# Patient Record
Sex: Male | Born: 1980 | Race: White | Hispanic: No | Marital: Married | Smoking: Former smoker
Health system: Southern US, Community
[De-identification: ages and names within clinical notes are randomized; demographics above are authoritative.]

## PROBLEM LIST (undated history)

## (undated) DIAGNOSIS — F209 Schizophrenia, unspecified: Secondary | ICD-10-CM

## (undated) DIAGNOSIS — I82409 Acute embolism and thrombosis of unspecified deep veins of unspecified lower extremity: Secondary | ICD-10-CM

## (undated) DIAGNOSIS — E119 Type 2 diabetes mellitus without complications: Secondary | ICD-10-CM

## (undated) DIAGNOSIS — N200 Calculus of kidney: Secondary | ICD-10-CM

## (undated) DIAGNOSIS — F319 Bipolar disorder, unspecified: Secondary | ICD-10-CM

## (undated) DIAGNOSIS — R569 Unspecified convulsions: Secondary | ICD-10-CM

## (undated) HISTORY — PX: OTHER SURGICAL HISTORY: SHX169

## (undated) HISTORY — DX: Bipolar disorder, unspecified: F31.9

## (undated) HISTORY — DX: Schizophrenia, unspecified: F20.9

---
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
## 2003-12-07 ENCOUNTER — Other Ambulatory Visit: Payer: Self-pay

## 2004-08-04 ENCOUNTER — Emergency Department: Payer: Self-pay | Admitting: Emergency Medicine

## 2004-08-16 ENCOUNTER — Emergency Department: Payer: Self-pay | Admitting: Unknown Physician Specialty

## 2005-12-07 ENCOUNTER — Emergency Department: Payer: Self-pay | Admitting: Emergency Medicine

## 2005-12-14 ENCOUNTER — Emergency Department: Payer: Self-pay | Admitting: Emergency Medicine

## 2006-10-19 ENCOUNTER — Emergency Department: Payer: Self-pay | Admitting: General Practice

## 2006-11-30 ENCOUNTER — Emergency Department: Payer: Self-pay | Admitting: Emergency Medicine

## 2008-03-11 ENCOUNTER — Emergency Department: Payer: Self-pay | Admitting: Emergency Medicine

## 2008-03-11 ENCOUNTER — Other Ambulatory Visit: Payer: Self-pay

## 2008-08-22 ENCOUNTER — Emergency Department: Payer: Self-pay | Admitting: Emergency Medicine

## 2008-09-07 IMAGING — CR DG CHEST 2V
1 series · 2 of 2 positions shown · non-contrast
Comparison: none

REASON FOR EXAM: cough, fever
COMMENTS:

PROCEDURE:     DXR - DXR CHEST PA (OR AP) AND LATERAL  - November 30, 2006  [DATE]
RESULT:     The lungs are clear. The heart and pulmonary vessels are normal.
The bony and mediastinal structures are unremarkable. There is no effusion.
There is no pneumothorax or evidence of congestive failure.

[Series 1: view not recorded · 0.17mm/px · 2 of 2 slices shown]
[im 1/2]
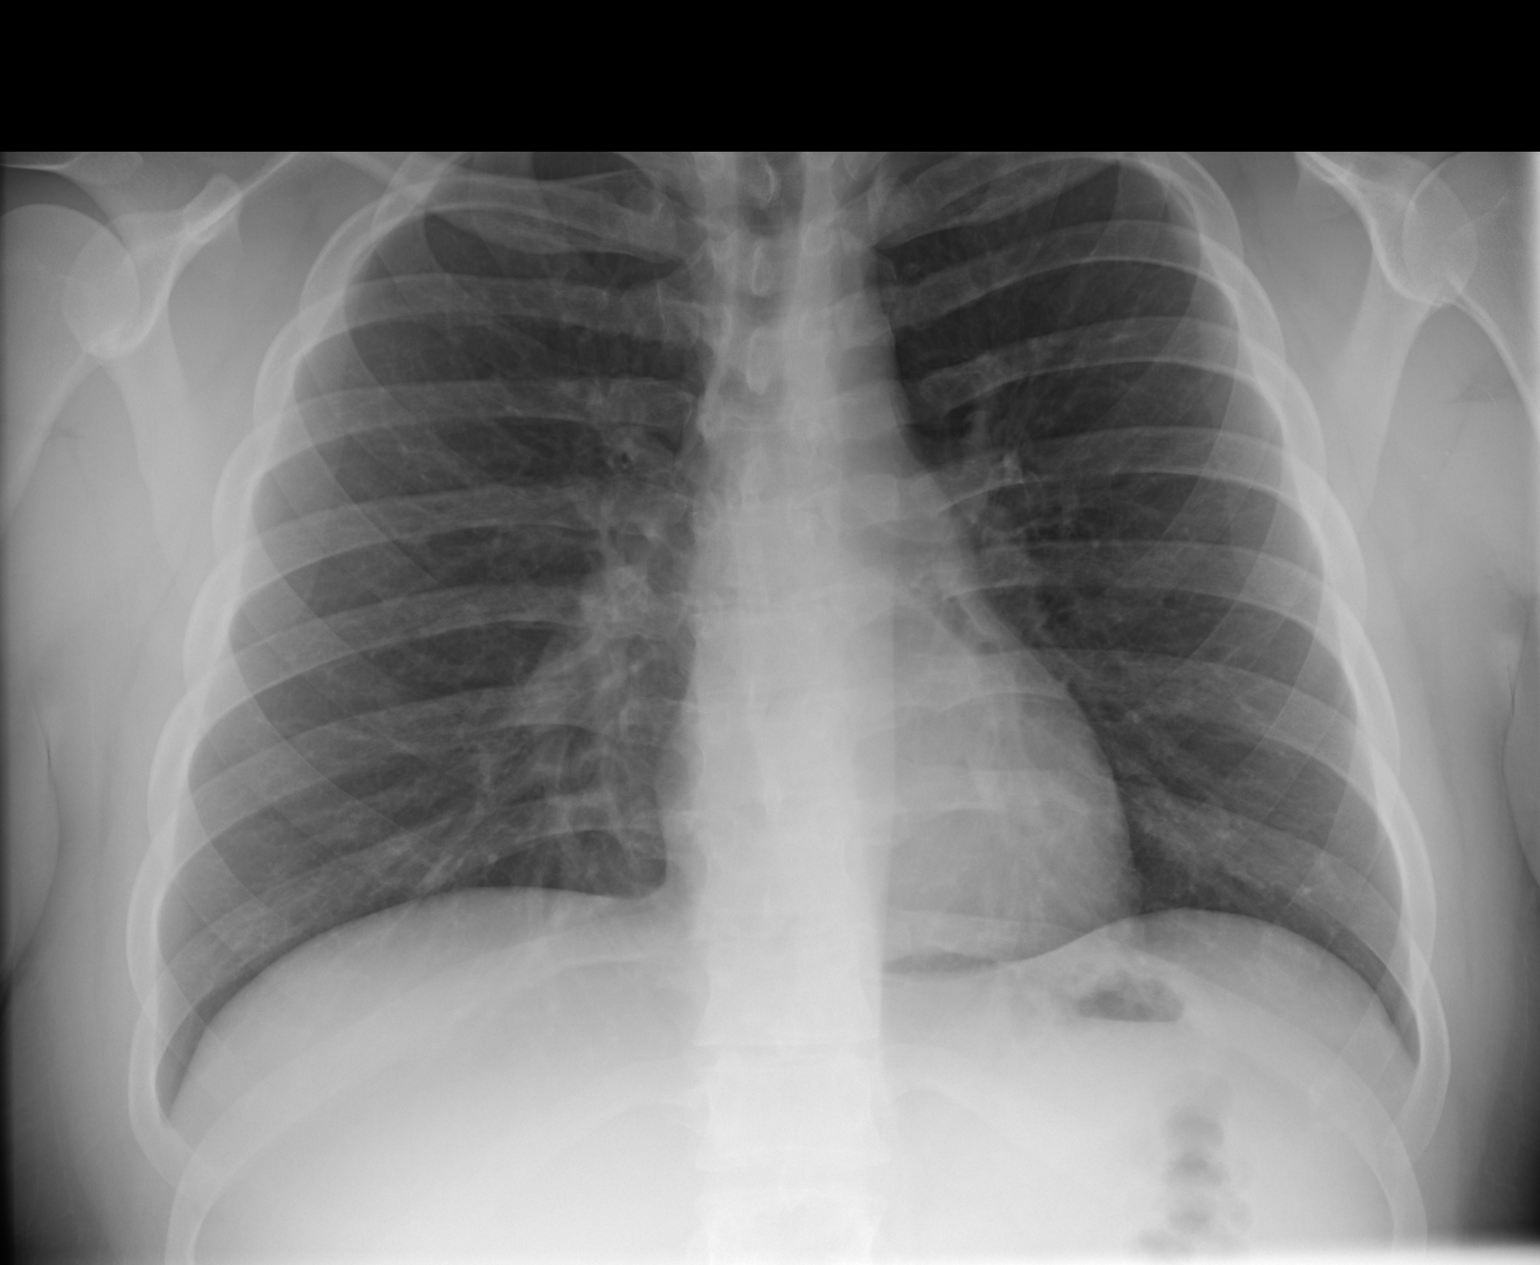
[im 2/2]
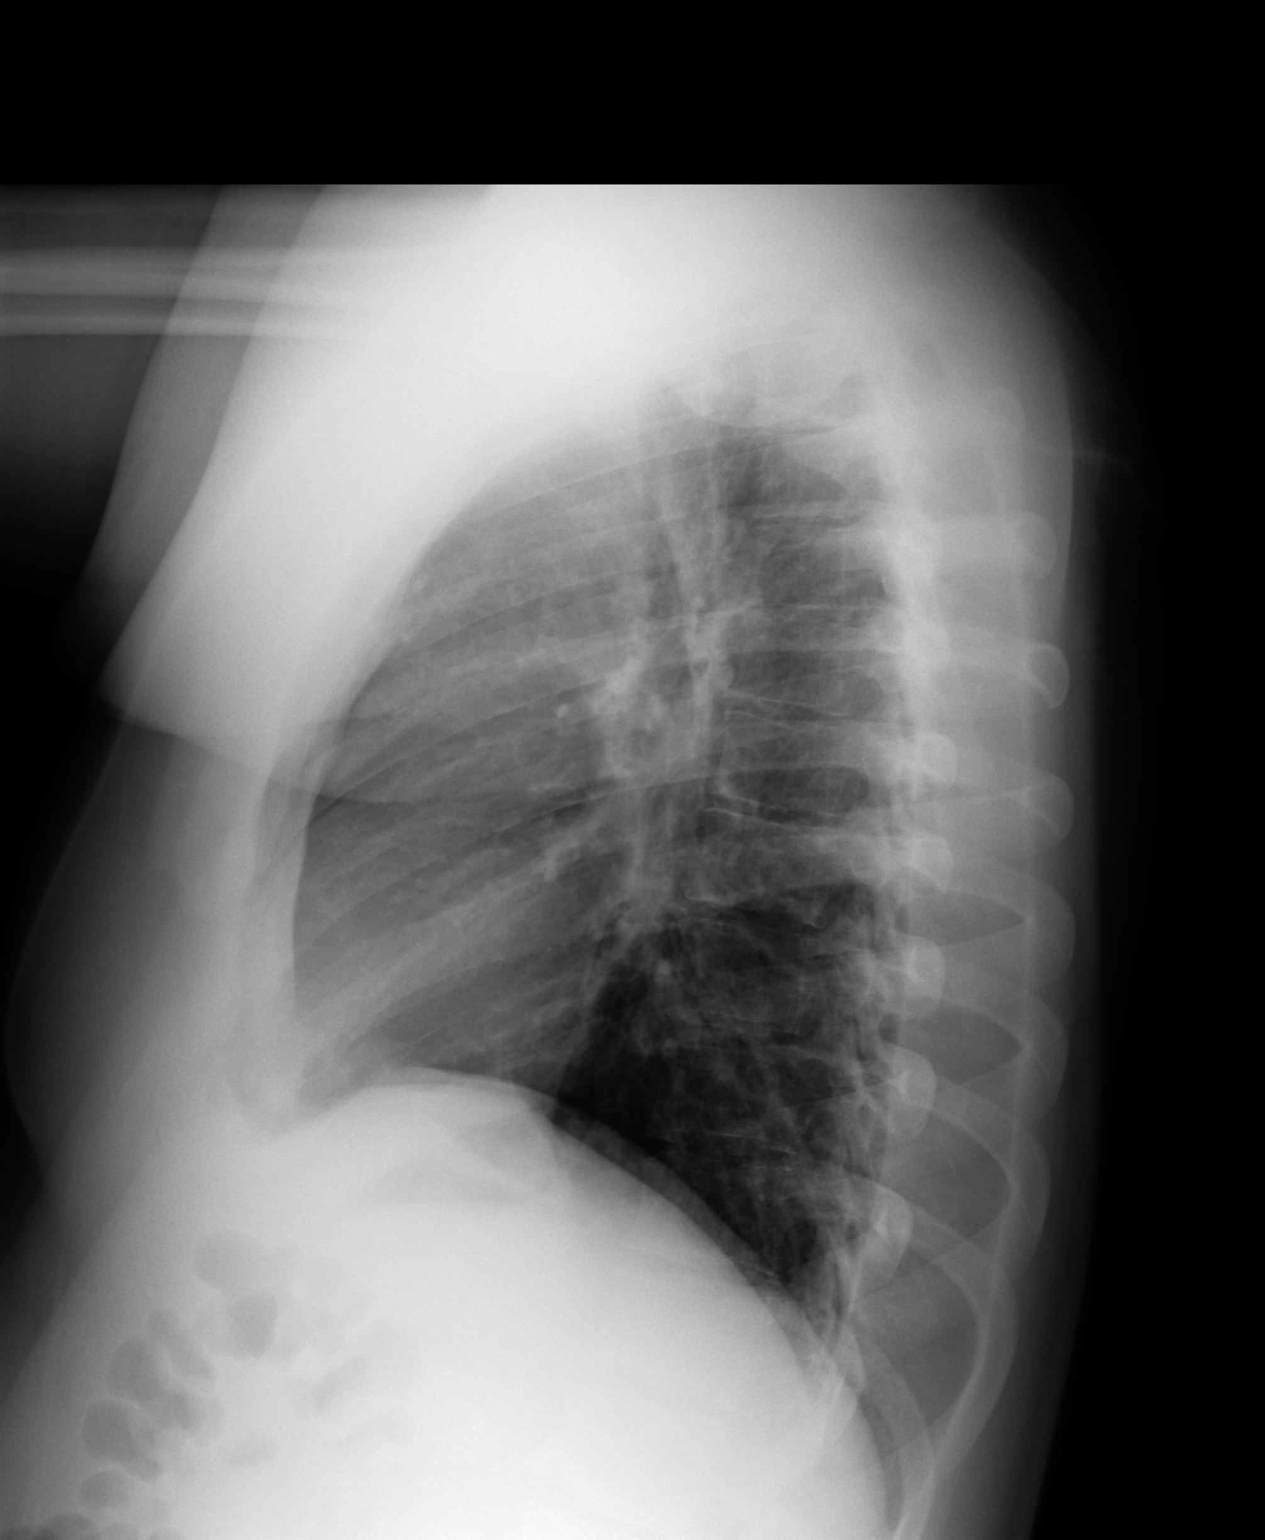

[2 of 2 positions shown; findings below may reference images not displayed]

IMPRESSION: No acute cardiopulmonary disease.

## 2008-11-20 ENCOUNTER — Emergency Department: Payer: Self-pay | Admitting: Emergency Medicine

## 2008-11-22 ENCOUNTER — Emergency Department: Payer: Self-pay | Admitting: Emergency Medicine

## 2009-01-29 ENCOUNTER — Emergency Department: Payer: Self-pay | Admitting: Emergency Medicine

## 2009-06-30 ENCOUNTER — Emergency Department: Payer: Self-pay | Admitting: Emergency Medicine

## 2009-10-18 ENCOUNTER — Emergency Department: Payer: Self-pay | Admitting: Emergency Medicine

## 2009-10-31 ENCOUNTER — Emergency Department: Payer: Self-pay | Admitting: Internal Medicine

## 2009-12-18 IMAGING — CR DG CHEST 2V
1 series · 2 of 2 positions shown · non-contrast
Comparison: none

REASON FOR EXAM: Chest Pain
COMMENTS:

PROCEDURE:     DXR - DXR CHEST PA (OR AP) AND LATERAL  - March 11, 2008  [DATE]
RESULT:     Comparison is made to a prior exam of 11/30/2006. The lung fields
are clear. The heart, mediastinal and osseous structures show no acute
changes.

[Series 1: view not recorded · 0.17mm/px · 2 of 2 slices shown]
[im 1/2]
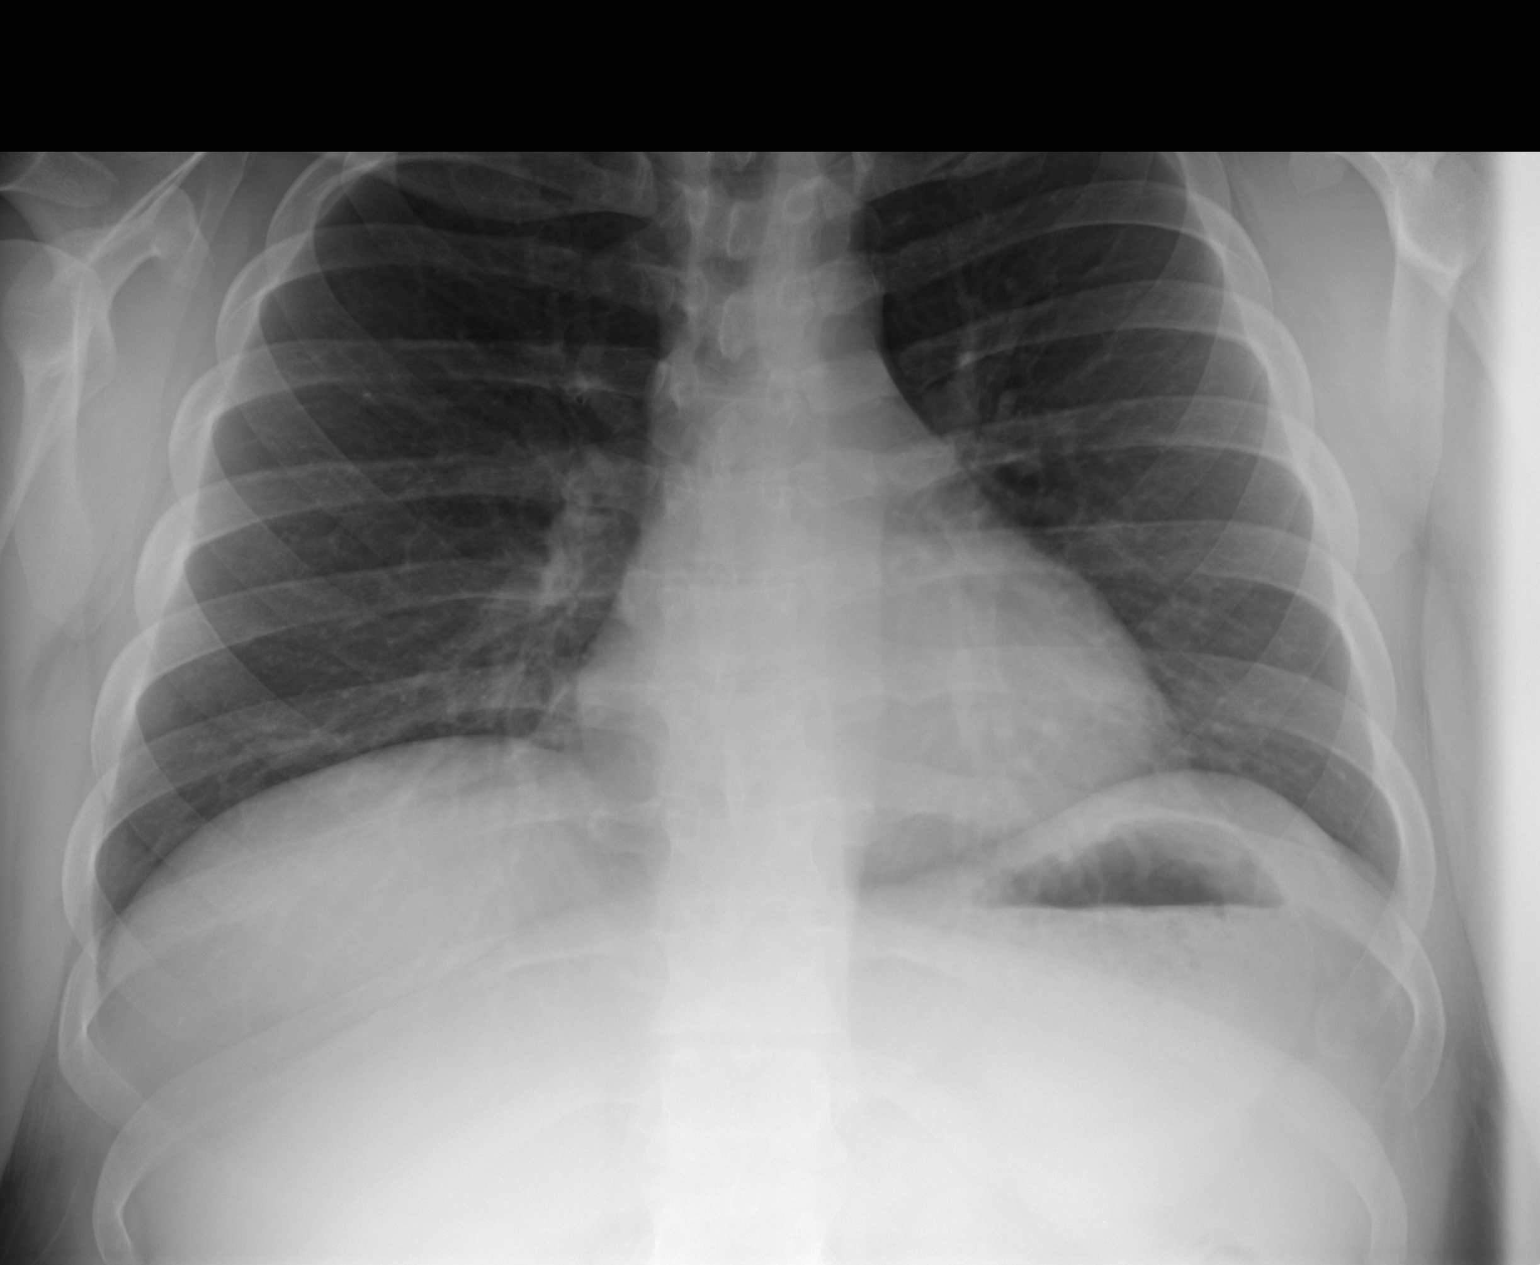
[im 2/2]
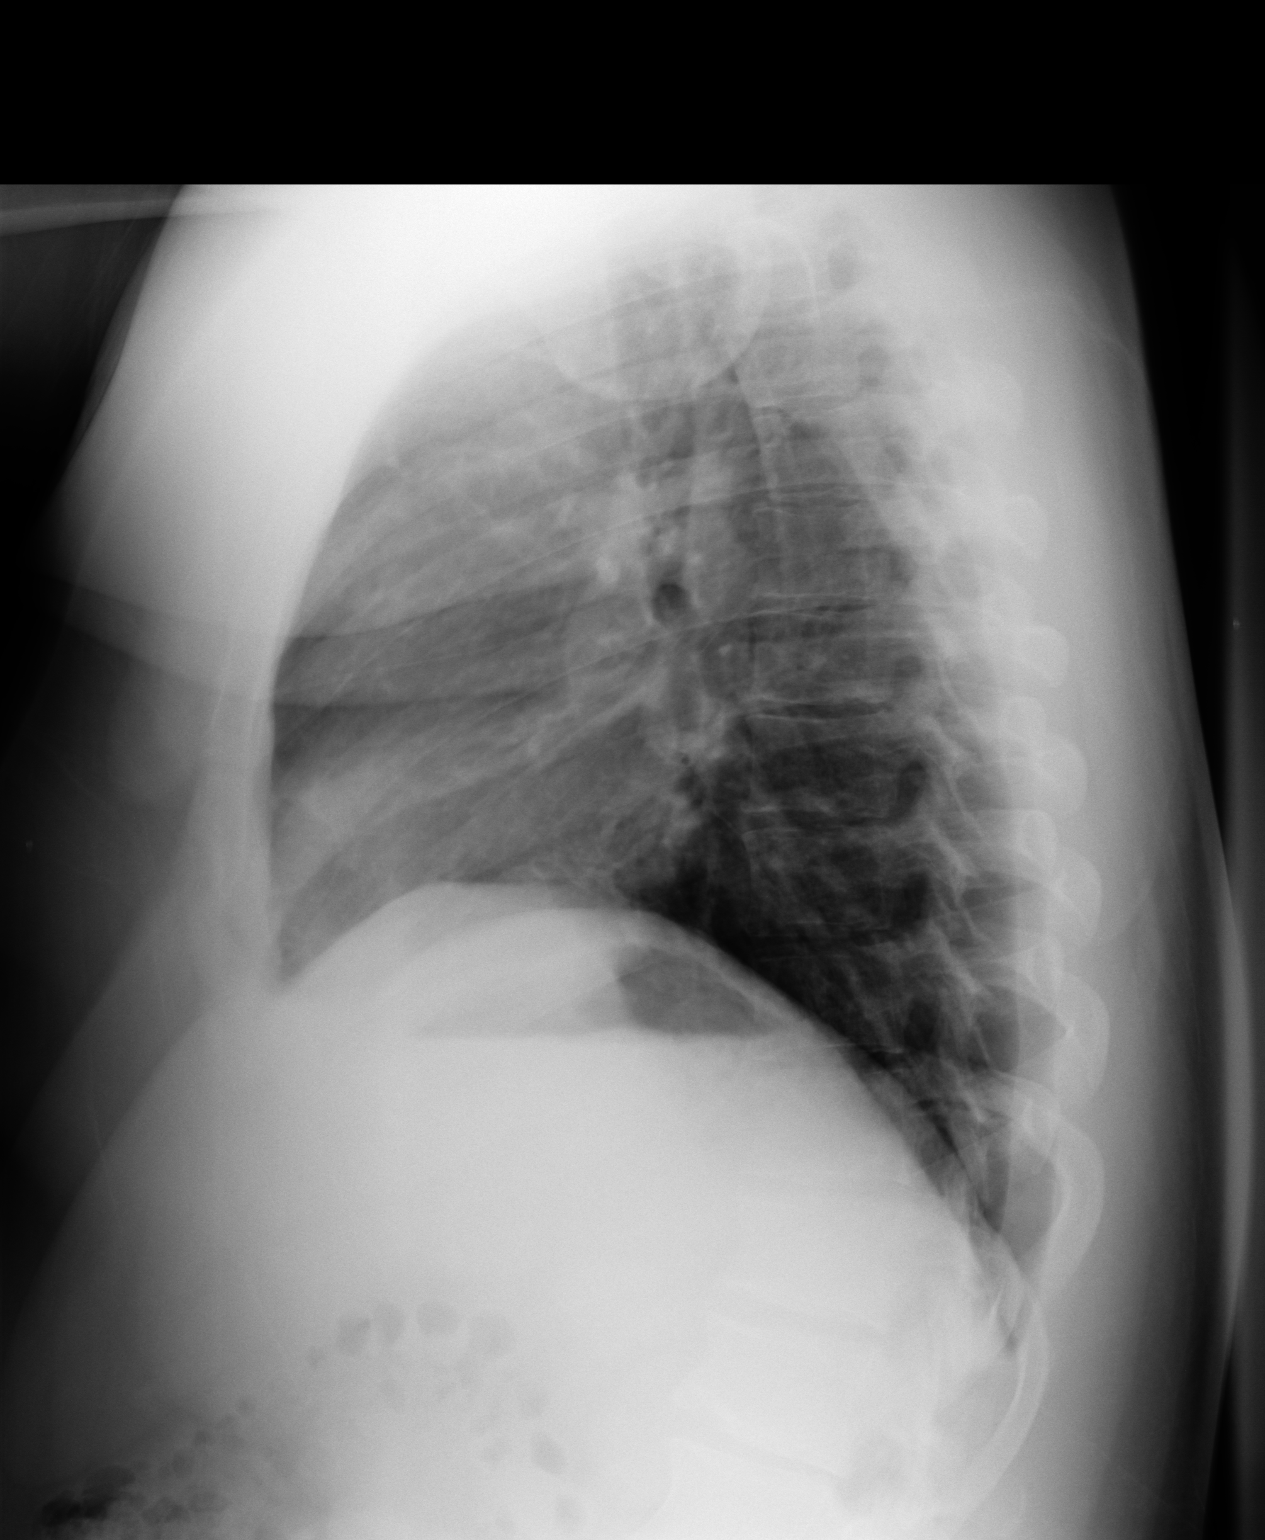

[2 of 2 positions shown; findings below may reference images not displayed]

IMPRESSION: 1.     No acute changes are identified.

## 2010-11-08 IMAGING — CT CT ABD-PELV W/ CM
1 of 2 series · 15 of 32 positions shown, 19 images · non-contrast
Comparison: none

REASON FOR EXAM: (1) peri umbilicial pain x 3 days; (2) pain with right
hydrocele
COMMENTS:   LMP: (Male)

[Series 2: abd/pelvis · axial · 0.88mm/px · z∈[-620,-174]mm · 15 of 163 slices shown, 19 images]
[im 7/163  soft-tissue]
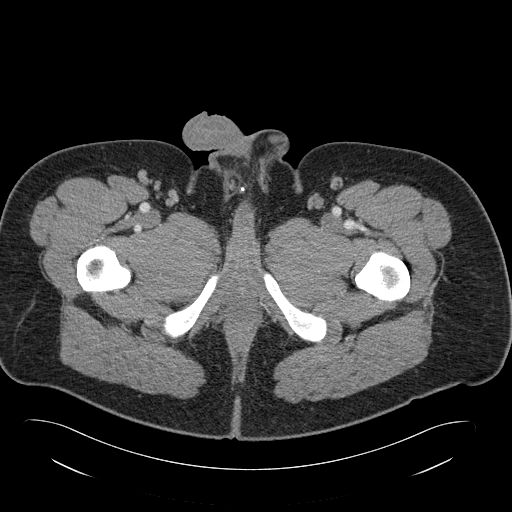
[im 7/163  bone]
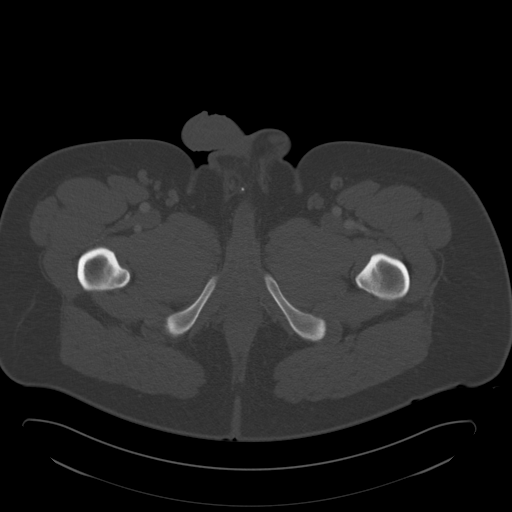
[im 21/163  soft-tissue]
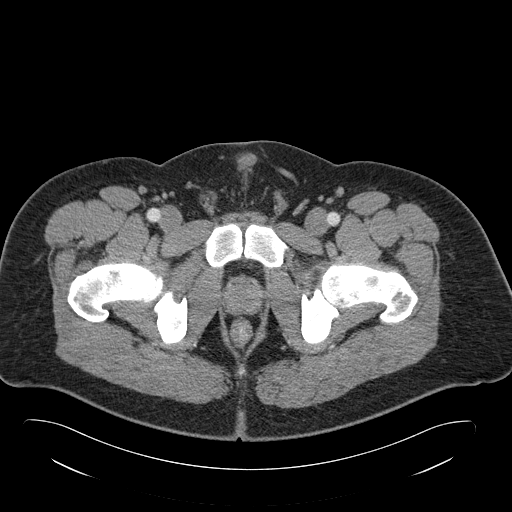
[im 34/163  soft-tissue]
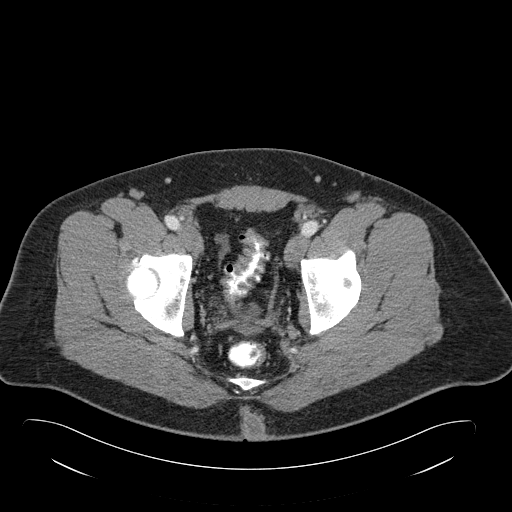
[im 48/163  soft-tissue]
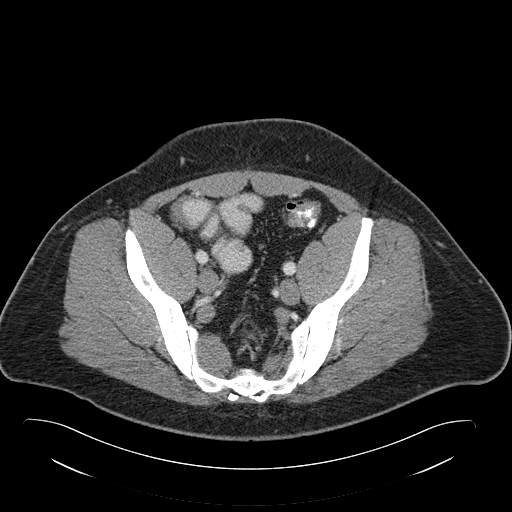
[im 55/163  soft-tissue]
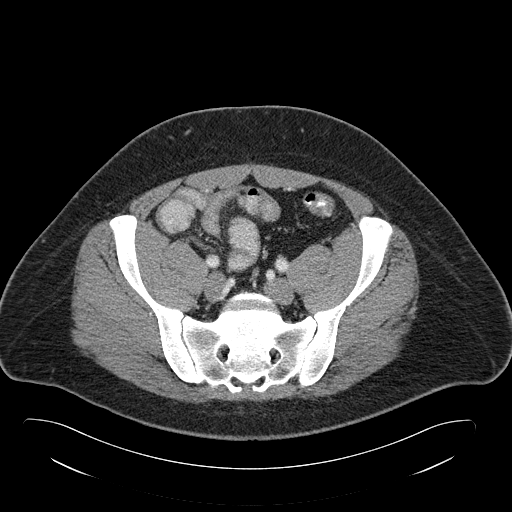
[im 68/163  soft-tissue]
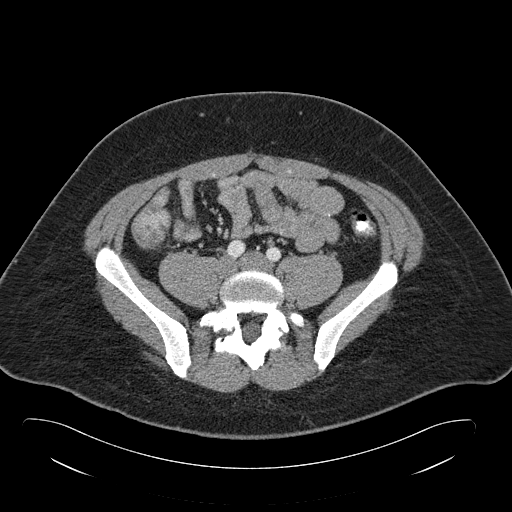
[im 82/163  soft-tissue]
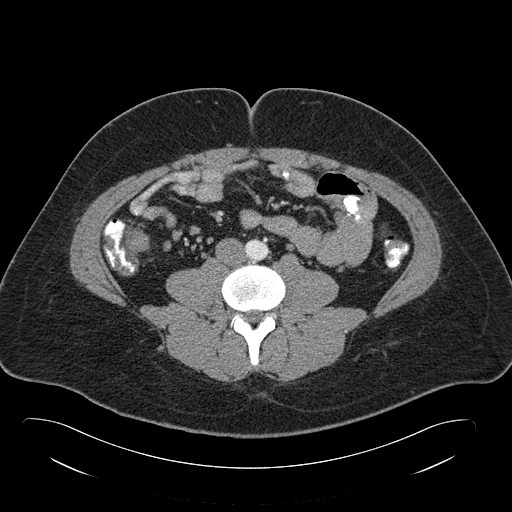
[im 95/163  soft-tissue]
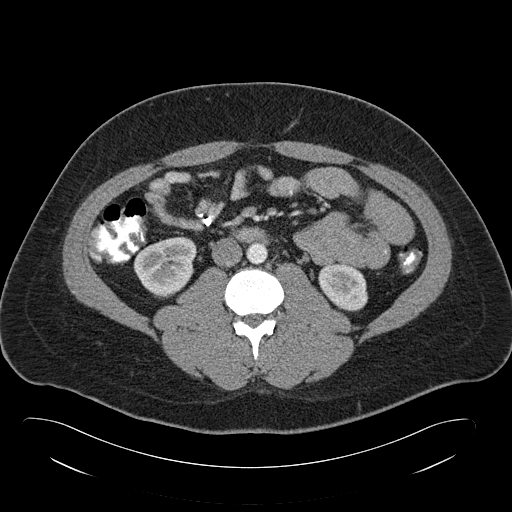
[im 109/163  soft-tissue]
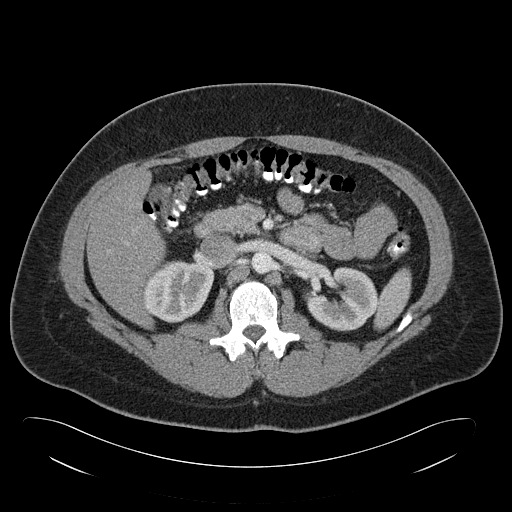
[im 109/163  bone]
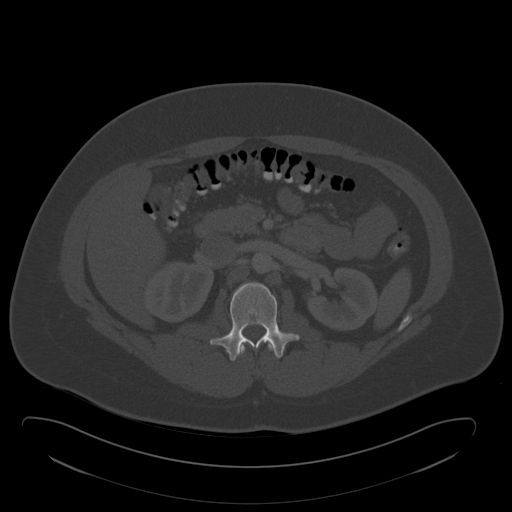
[im 115/163  soft-tissue]
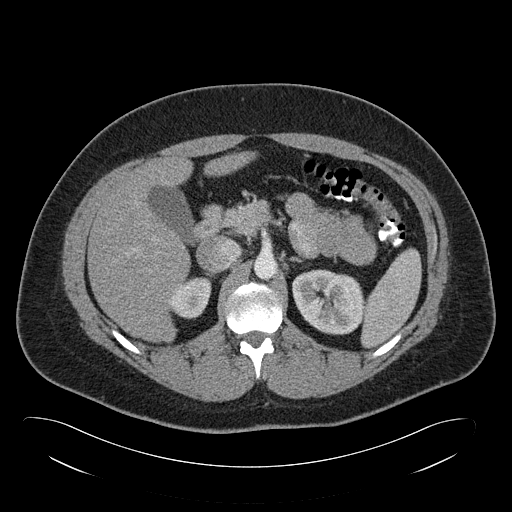
[im 129/163  soft-tissue]
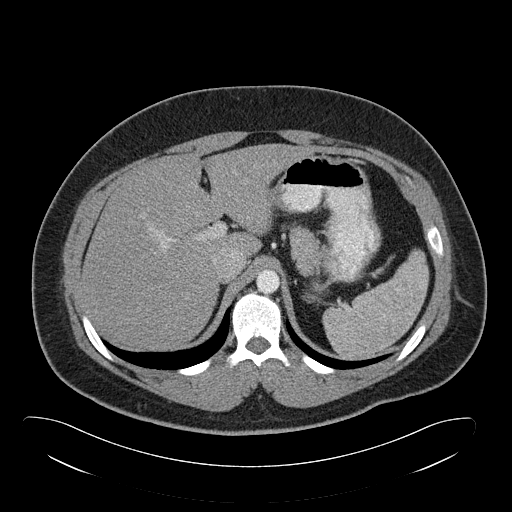
[im 136/163  lung]
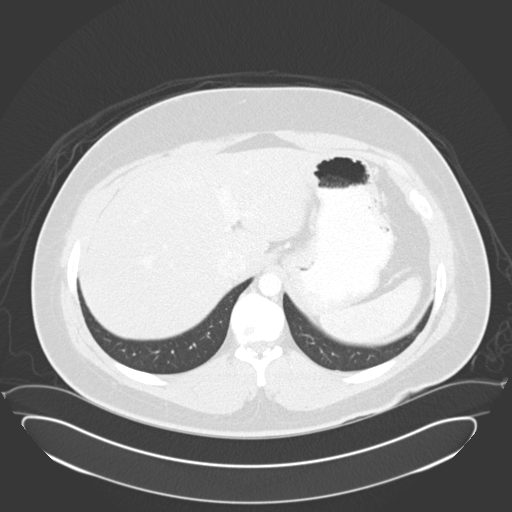
[im 142/163  soft-tissue]
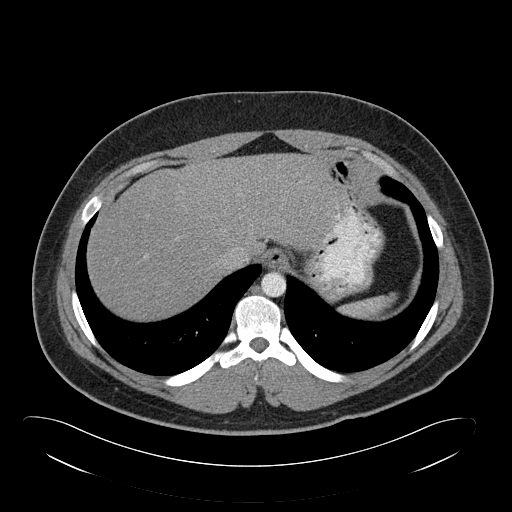
[im 142/163  lung]
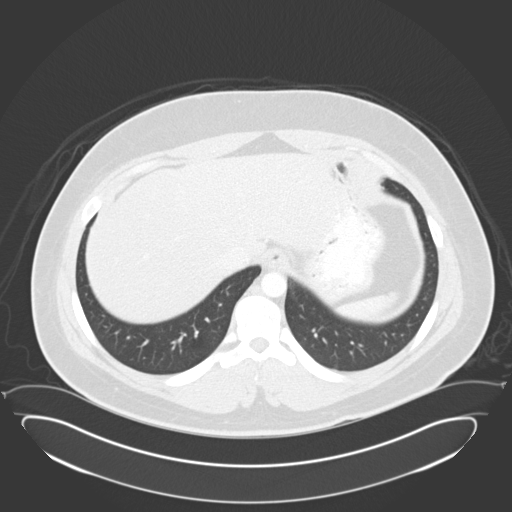
[im 149/163  lung]
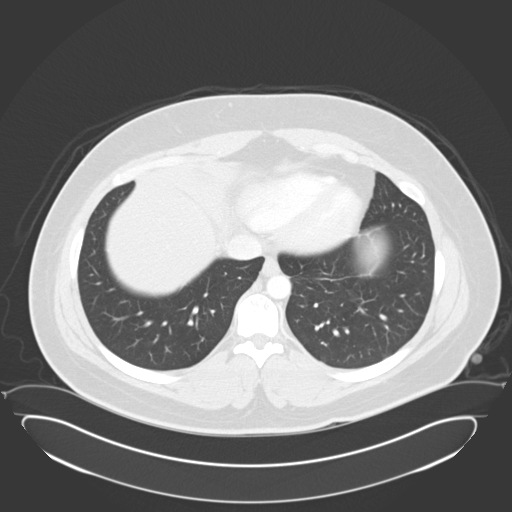
[im 156/163  soft-tissue]
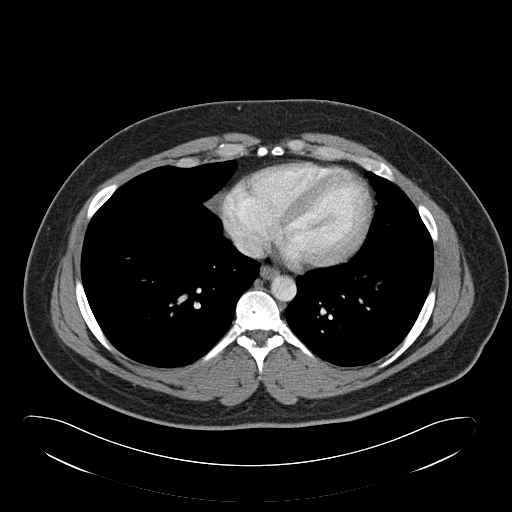
[im 156/163  lung]
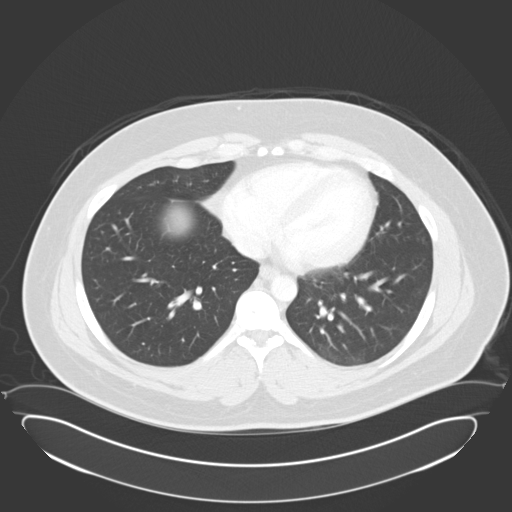

[15 of 32 positions shown; findings below may reference images not displayed]

PROCEDURE:     CT  - CT ABDOMEN / PELVIS  W  - January 30, 2009 [DATE]

RESULT:     Axial contrast enhanced CT scanning was performed through the
abdomen and pelvis at 5 correction at 3 mm intervals and slice thicknesses
following intravenous administration of 100 cc of [id number]. The patient
also received oral contrast material.

The liver, gallbladder, pancreas, spleen, partially distended stomach,
adrenal glands, and kidneys are normal in appearance. There is an abnormal
appearance of a structure in the right lower quadrant of the abdomen which
may reflect an inflamed terminal ileum. This is demonstrated best on images
90 correction 76 through 96. There is a structure compatible with a normal
appendix demonstrated on images 88 through 95. There is no significant free
fluid in the abdomen there but there is a small amount in the pelvis. The
sigmoid colon exhibits a few diverticula but I do not see evidence of acute
diverticulitis. There is no evidence of bowel obstruction. The caliber of
the abdominal aorta is normal. The urinary bladder and prostate gland appear
normal. The lung bases are clear. The lumbar vertebral bodies are preserved
in height.
IMPRESSION: 1. There is thickening of the distal 10 to 15 cm of the ileum which may
reflect inflammatory bowel disease. A definite inflamed appendix is not
identified. There is a structure demonstrated which likely reflects a normal
appendix on the coronal images. There are a few borderline to mildly
enlarged pericecal and more proximal mesenteric lymph nodes present.
2. I do not see evidence of bowel obstruction. There is a small amount of
free fluid in the pelvis.
3. I do not see evidence of acute hepatobiliary abnormality nor acute
urinary tract abnormality.

A preliminary report was sent to the [HOSPITAL] the conclusion
of the study

## 2011-07-13 ENCOUNTER — Inpatient Hospital Stay: Payer: Self-pay | Admitting: Specialist

## 2011-10-11 ENCOUNTER — Inpatient Hospital Stay: Payer: Self-pay | Admitting: Internal Medicine

## 2011-10-11 LAB — CBC WITH DIFFERENTIAL/PLATELET
Basophil #: 0.1 10*3/uL (ref 0.0–0.1)
Basophil %: 0.5 %
Eosinophil #: 0.6 10*3/uL (ref 0.0–0.7)
HGB: 14.4 g/dL (ref 13.0–18.0)
Lymphocyte #: 4.7 10*3/uL — ABNORMAL HIGH (ref 1.0–3.6)
Lymphocyte %: 39.8 %
MCH: 31 pg (ref 26.0–34.0)
MCHC: 33.6 g/dL (ref 32.0–36.0)
MCV: 92 fL (ref 80–100)
Monocyte #: 0.8 10*3/uL — ABNORMAL HIGH (ref 0.0–0.7)
Neutrophil %: 47.9 %
RDW: 13.4 % (ref 11.5–14.5)

## 2011-10-11 LAB — COMPREHENSIVE METABOLIC PANEL
Albumin: 3.5 g/dL (ref 3.4–5.0)
Alkaline Phosphatase: 70 U/L (ref 50–136)
Anion Gap: 9 (ref 7–16)
BUN: 13 mg/dL (ref 7–18)
Bilirubin,Total: 0.3 mg/dL (ref 0.2–1.0)
Creatinine: 0.9 mg/dL (ref 0.60–1.30)
EGFR (Non-African Amer.): >60
Glucose: 93 mg/dL (ref 65–99)
Osmolality: 283 (ref 275–301)
Potassium: 3.6 mmol/L (ref 3.5–5.1)
Sodium: 142 mmol/L (ref 136–145)
Total Protein: 6.8 g/dL (ref 6.4–8.2)

## 2011-10-11 LAB — PROTIME-INR
INR: 0.9
Prothrombin Time: 12.9 secs (ref 11.5–14.7)

## 2012-02-15 ENCOUNTER — Emergency Department: Payer: Self-pay | Admitting: Emergency Medicine

## 2012-02-15 LAB — URINALYSIS, COMPLETE
Bacteria: NONE SEEN
Blood: NEGATIVE
Ketone: NEGATIVE
Nitrite: NEGATIVE
Protein: NEGATIVE
Specific Gravity: 1.03 (ref 1.003–1.030)
Squamous Epithelial: <1
WBC UR: 5 /HPF (ref 0–5)

## 2012-02-25 ENCOUNTER — Emergency Department: Payer: Self-pay | Admitting: Emergency Medicine

## 2012-02-25 LAB — CBC WITH DIFFERENTIAL/PLATELET
Basophil %: 0.8 %
Eosinophil %: 2.4 %
Lymphocyte %: 41 %
MCH: 31.2 pg (ref 26.0–34.0)
MCV: 92 fL (ref 80–100)
Monocyte #: 0.6 x10 3/mm (ref 0.2–1.0)
Monocyte %: 5.7 %
RBC: 4.63 10*6/uL (ref 4.40–5.90)
RDW: 13.5 % (ref 11.5–14.5)
WBC: 9.9 10*3/uL (ref 3.8–10.6)

## 2012-02-25 LAB — BASIC METABOLIC PANEL
Anion Gap: 10 (ref 7–16)
BUN: 11 mg/dL (ref 7–18)
Calcium, Total: 8.5 mg/dL (ref 8.5–10.1)
Co2: 25 mmol/L (ref 21–32)
Glucose: 120 mg/dL — ABNORMAL HIGH (ref 65–99)
Osmolality: 284 (ref 275–301)
Sodium: 142 mmol/L (ref 136–145)

## 2012-02-25 LAB — VALPROIC ACID LEVEL: Valproic Acid: <3 ug/mL — ABNORMAL LOW

## 2012-02-25 LAB — CK: CK, Total: 203 U/L (ref 35–232)

## 2012-06-20 ENCOUNTER — Emergency Department: Payer: Self-pay | Admitting: *Deleted

## 2012-06-20 LAB — CBC WITH DIFFERENTIAL/PLATELET
Basophil #: 0.1 10*3/uL (ref 0.0–0.1)
Eosinophil #: 0.3 10*3/uL (ref 0.0–0.7)
Eosinophil %: 3.3 %
MCH: 31.6 pg (ref 26.0–34.0)
Monocyte #: 0.7 x10 3/mm (ref 0.2–1.0)
Monocyte %: 7.2 %
Neutrophil %: 52.1 %
Platelet: 199 10*3/uL (ref 150–440)
RBC: 5.27 10*6/uL (ref 4.40–5.90)
WBC: 10.1 10*3/uL (ref 3.8–10.6)

## 2012-06-20 LAB — PROTIME-INR
INR: 0.9
Prothrombin Time: 12.5 secs (ref 11.5–14.7)

## 2012-06-20 LAB — BASIC METABOLIC PANEL
Calcium, Total: 9 mg/dL (ref 8.5–10.1)
Chloride: 105 mmol/L (ref 98–107)
Co2: 28 mmol/L (ref 21–32)
Creatinine: 1.14 mg/dL (ref 0.60–1.30)
Potassium: 3.8 mmol/L (ref 3.5–5.1)
Sodium: 139 mmol/L (ref 136–145)

## 2012-06-20 LAB — APTT: Activated PTT: 26 secs (ref 23.6–35.9)

## 2012-10-19 ENCOUNTER — Emergency Department: Payer: Self-pay | Admitting: Internal Medicine

## 2012-10-19 LAB — COMPREHENSIVE METABOLIC PANEL
Alkaline Phosphatase: 98 U/L (ref 50–136)
Anion Gap: 5 — ABNORMAL LOW (ref 7–16)
Bilirubin,Total: 0.3 mg/dL (ref 0.2–1.0)
Chloride: 106 mmol/L (ref 98–107)
Co2: 27 mmol/L (ref 21–32)
EGFR (Non-African Amer.): >60
Glucose: 127 mg/dL — ABNORMAL HIGH (ref 65–99)
SGOT(AST): 24 U/L (ref 15–37)
Sodium: 138 mmol/L (ref 136–145)

## 2012-10-19 LAB — URINALYSIS, COMPLETE
Bilirubin,UR: NEGATIVE
Leukocyte Esterase: NEGATIVE
Protein: 100
RBC,UR: 1583 /HPF (ref 0–5)
Specific Gravity: 1.031 (ref 1.003–1.030)
Squamous Epithelial: 11

## 2012-10-19 LAB — PROTIME-INR: Prothrombin Time: 14.1 secs (ref 11.5–14.7)

## 2012-10-19 LAB — CBC
MCH: 31.5 pg (ref 26.0–34.0)
MCHC: 34.2 g/dL (ref 32.0–36.0)
MCV: 92 fL (ref 80–100)
RBC: 5.02 10*6/uL (ref 4.40–5.90)
RDW: 13.8 % (ref 11.5–14.5)
WBC: 8.7 10*3/uL (ref 3.8–10.6)

## 2013-04-20 IMAGING — US US EXTREM LOW VENOUS*L*
1 series · 17 of 24 positions shown · non-contrast
Comparison: none

REASON FOR EXAM: swelling pain
COMMENTS:

[Series 1: us extrem low venous*left* · 17 of 25 slices shown]
[im 1/25]
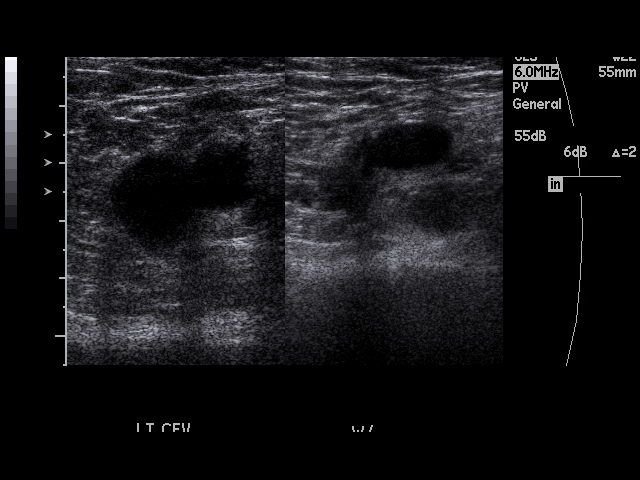
[im 3/25]
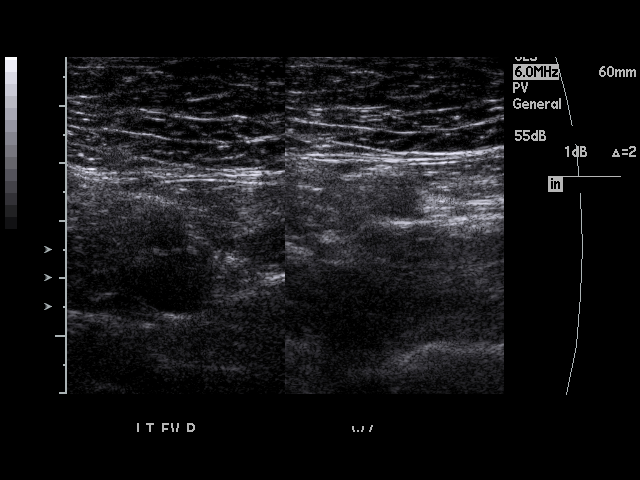
[im 4/25]
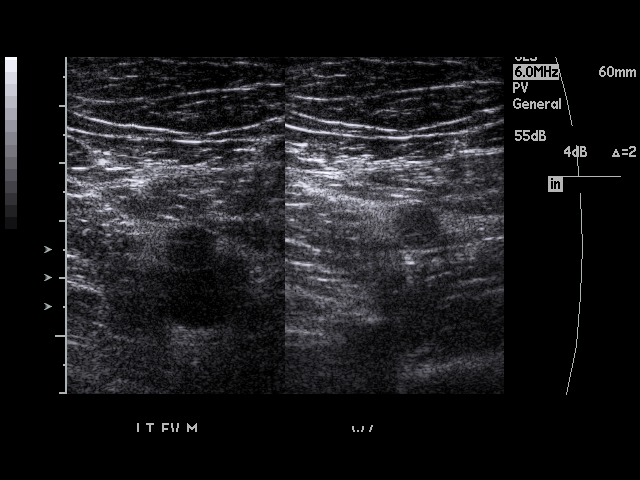
[im 5/25]
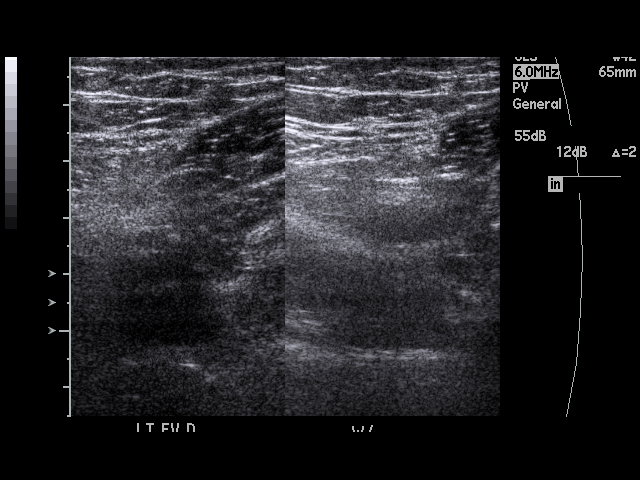
[im 7/25]
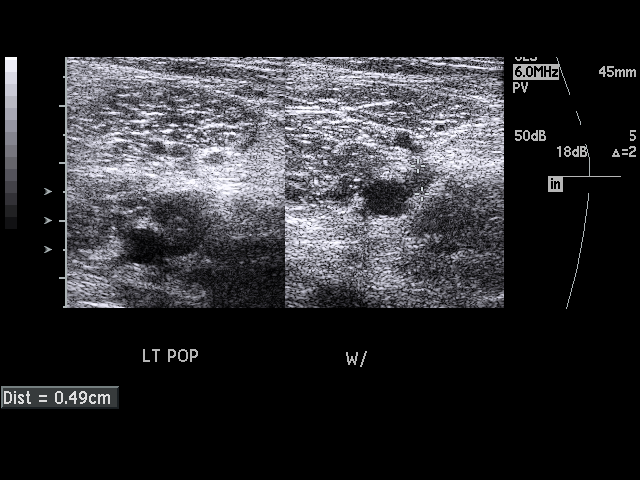
[im 8/25]
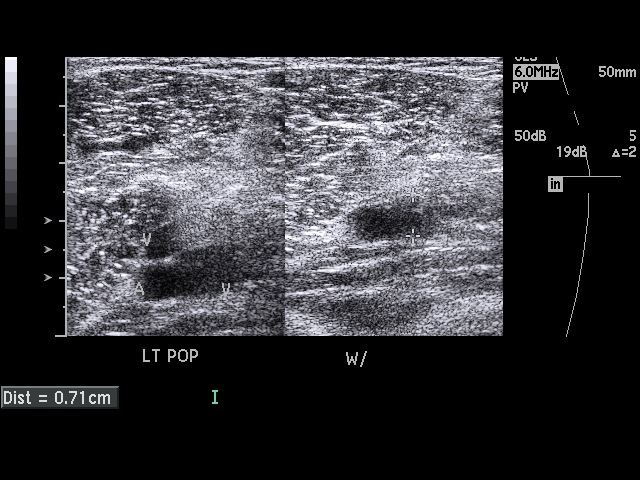
[im 10/25]
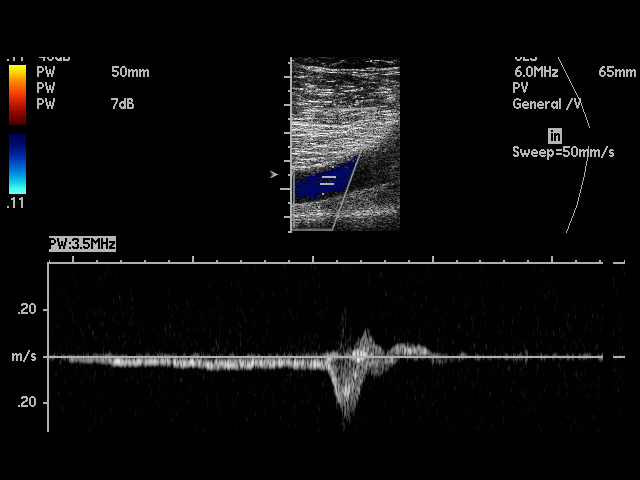
[im 11/25]
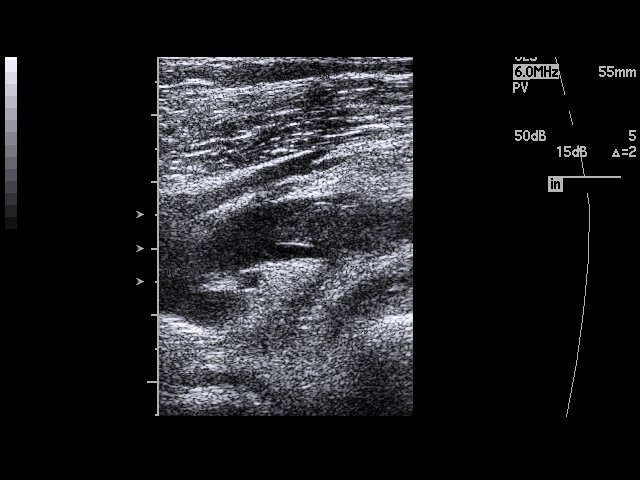
[im 13/25]
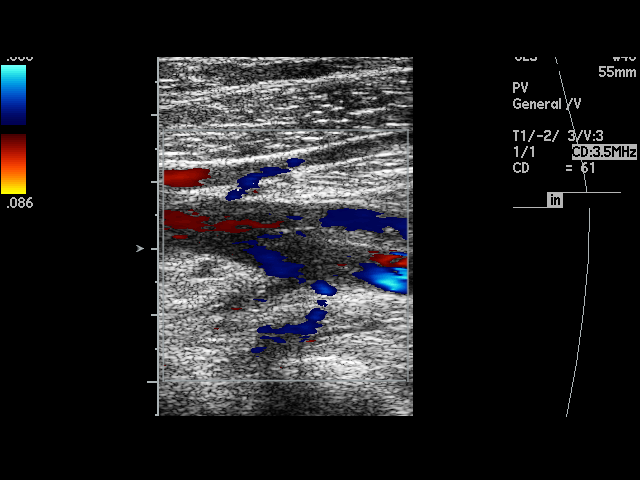
[im 14/25]
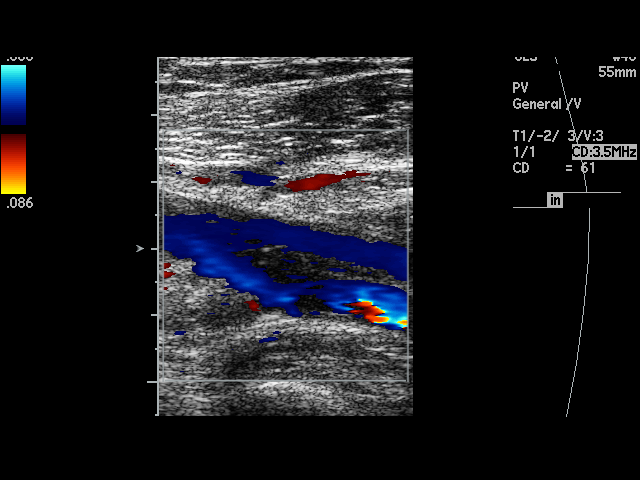
[im 15/25]
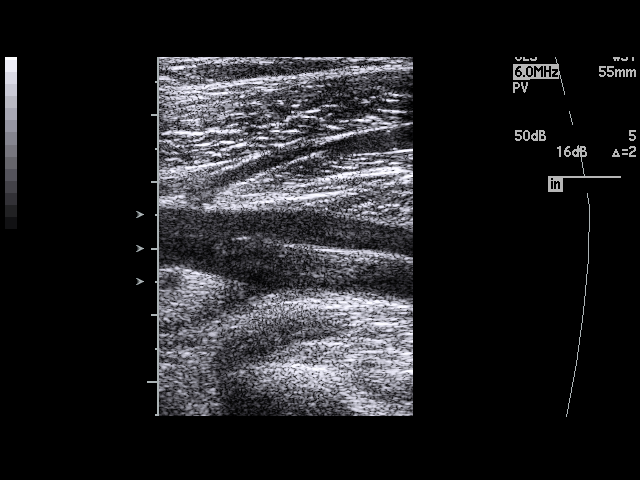
[im 17/25]
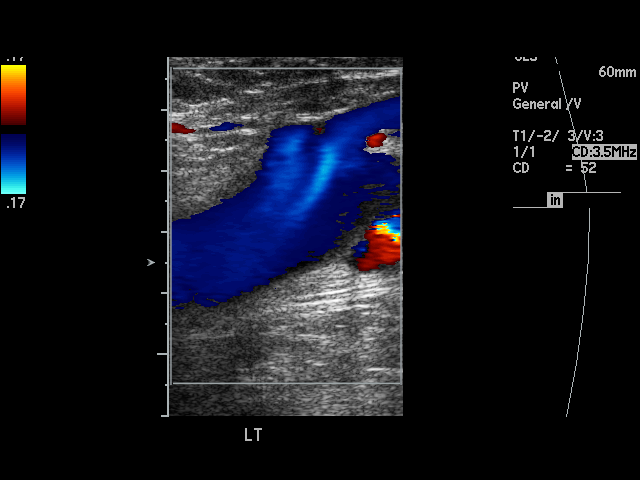
[im 18/25]
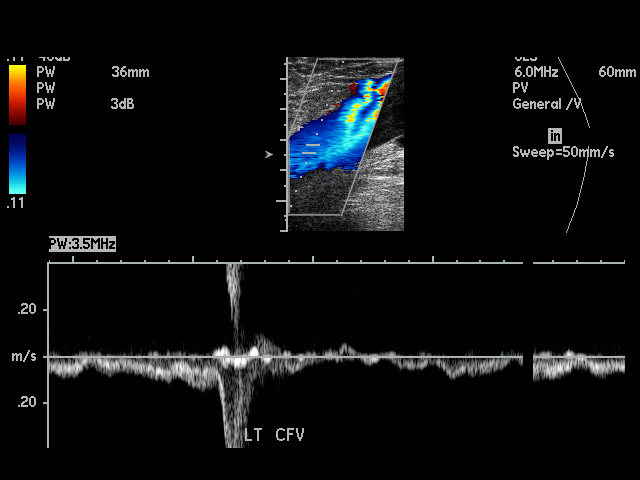
[im 20/25]
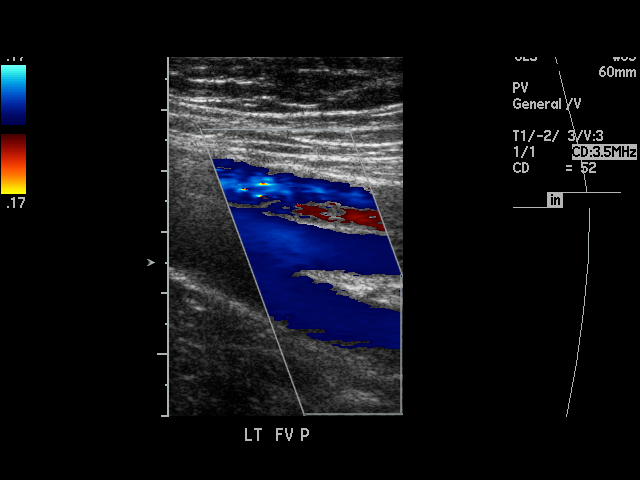
[im 21/25]
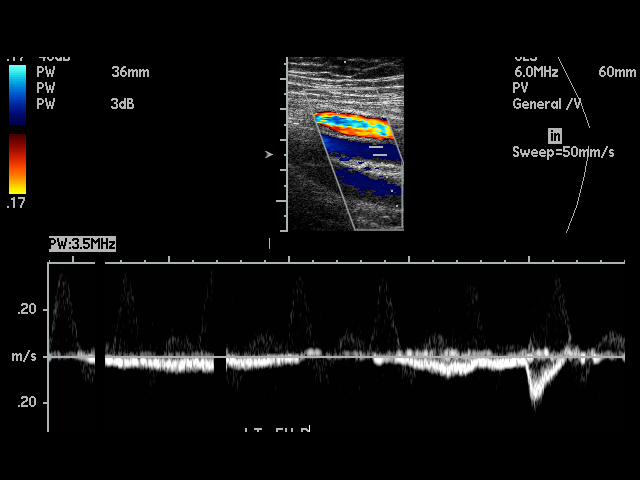
[im 22/25]
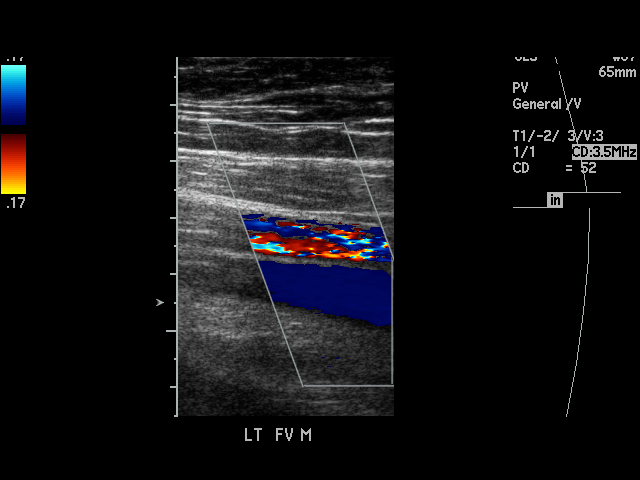
[im 25/25]
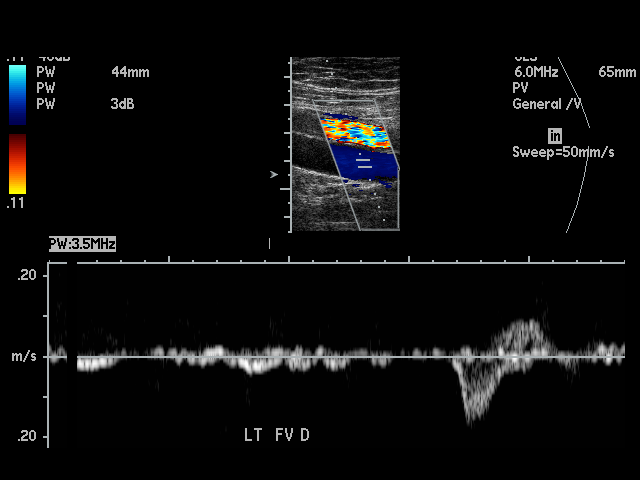

[17 of 24 positions shown; findings below may reference images not displayed]

PROCEDURE:     US  - US DOPPLER LOW EXTR LEFT  - July 13, 2011  [DATE]

RESULT:     The left superficial femoral and common femoral veins are
normally compressible. The waveform patterns are normal and the color flow
images are normal. In the popliteal vein however there are intraluminal
echoes consistent with nonobstructing thrombus. This extends into the
posterior tibial vein system in the upper calf.
IMPRESSION: There is nonocclusive thrombus in the popliteal vein. The
superficial femoral and common femoral veins appear patent.

A preliminary report was sent to the [HOSPITAL] the conclusion
of the study.

## 2013-04-21 ENCOUNTER — Emergency Department: Payer: Self-pay | Admitting: Emergency Medicine

## 2013-07-19 IMAGING — XA IR VASCULAR PROCEDURE
2 series · 5 of 5 positions shown · non-contrast
Comparison: none

[Series 1: ivc · 4 of 4 slices shown]
[im 1/4]
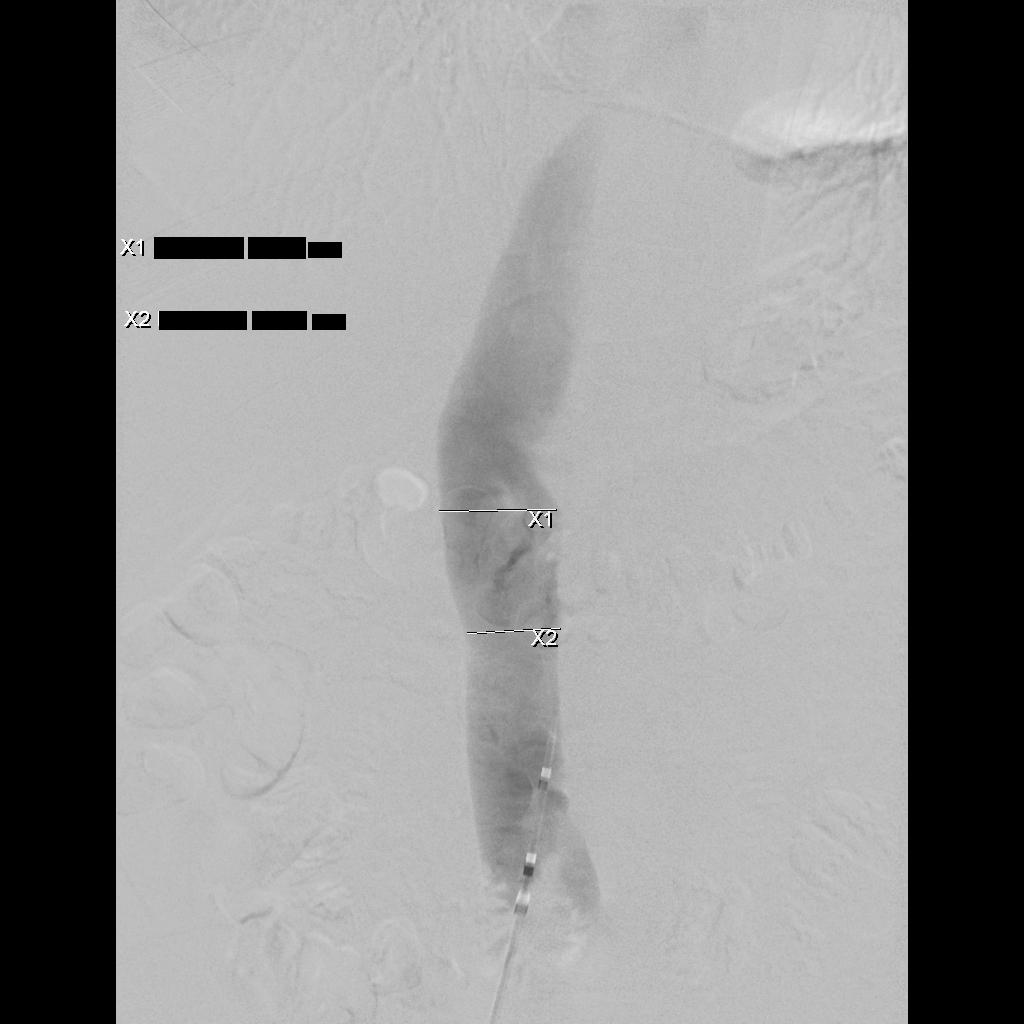
[im 2/4]
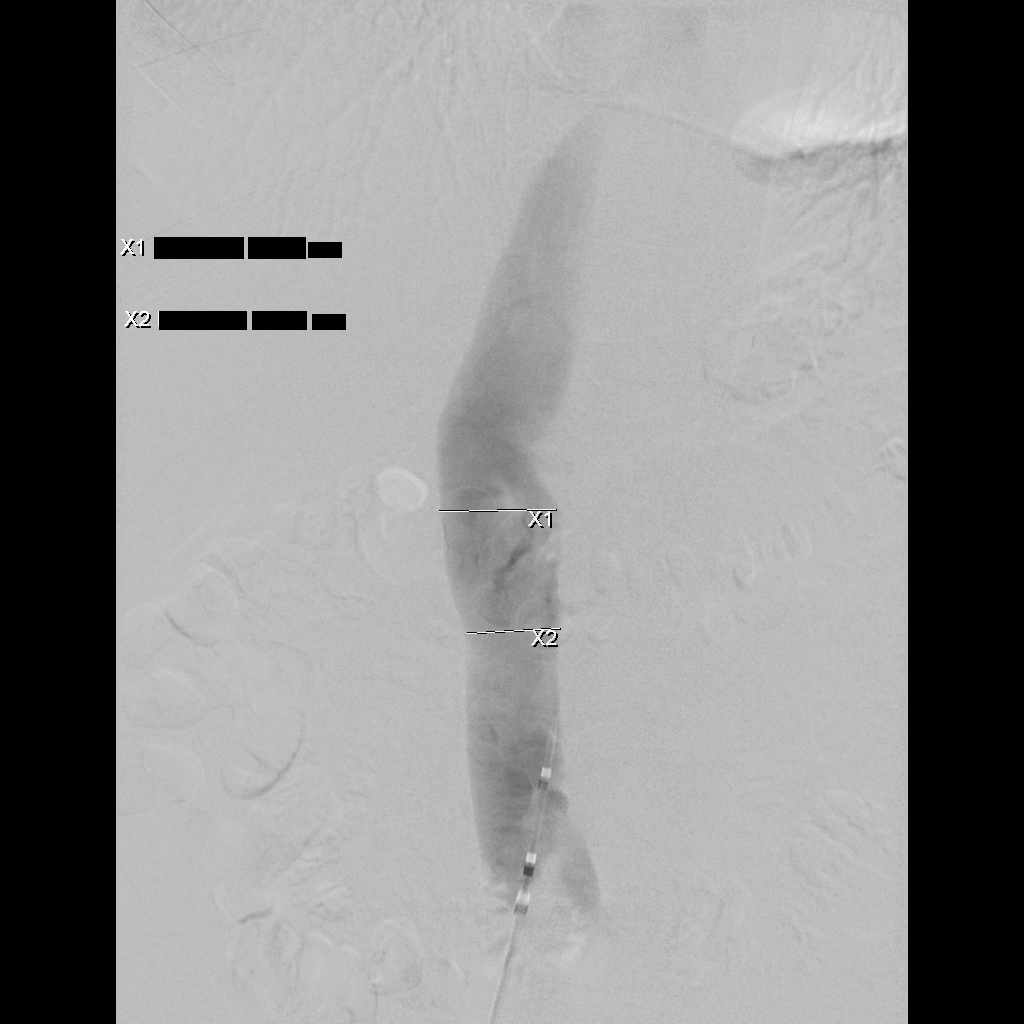
[im 3/4]
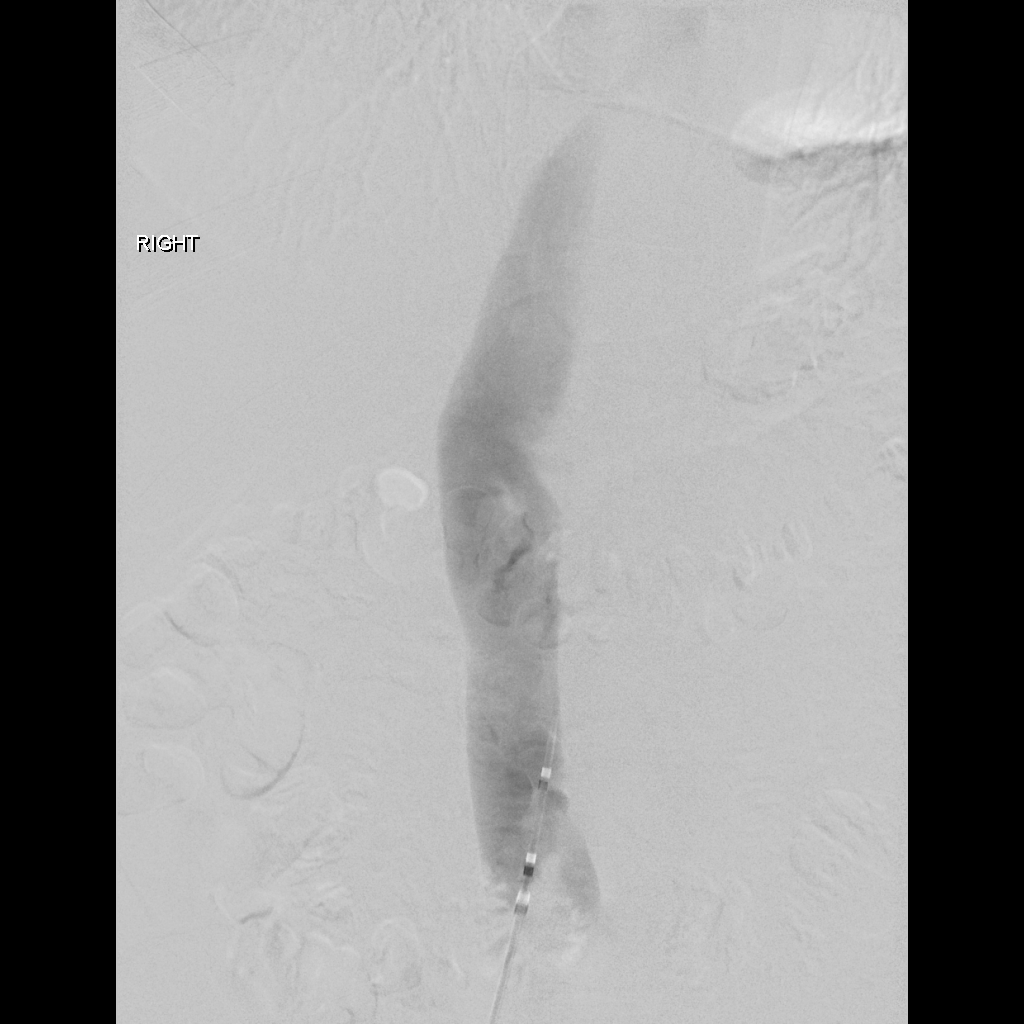
[im 4/4]
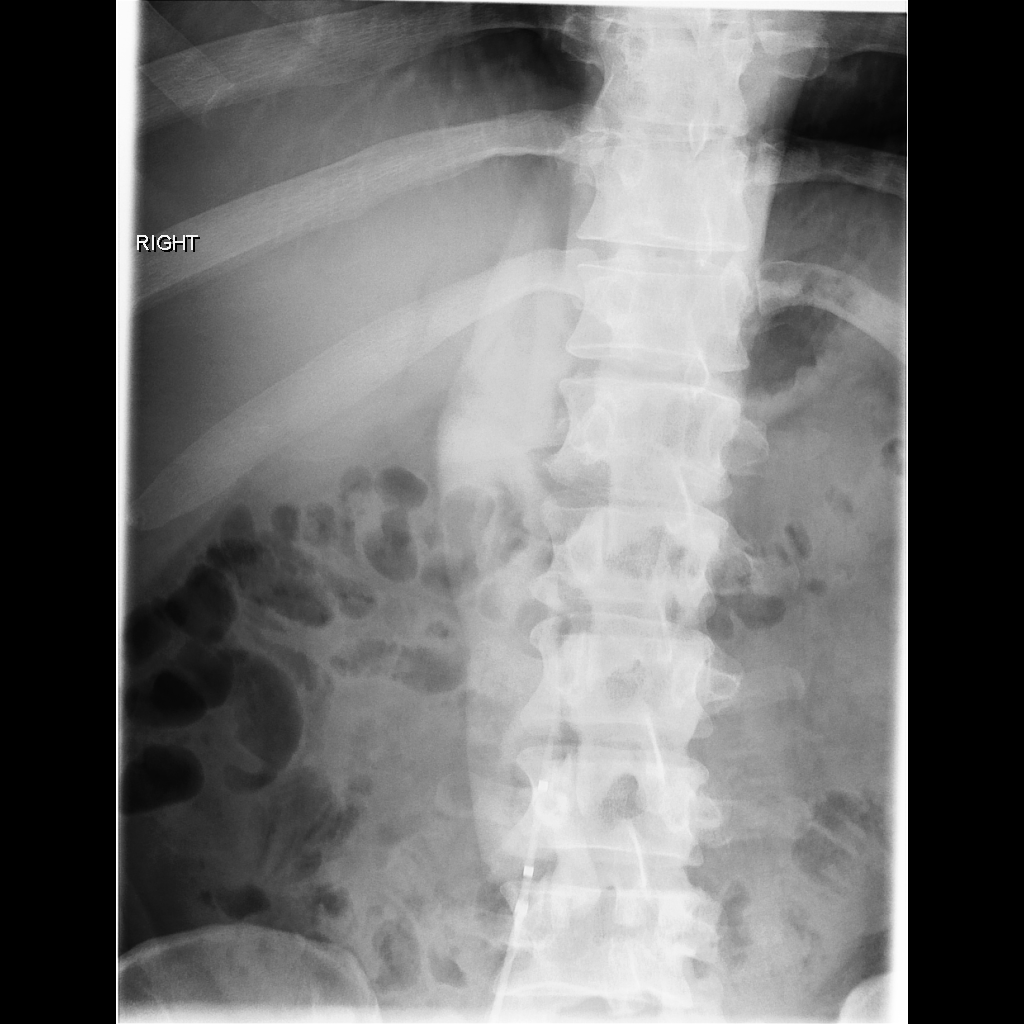

[Series 4: single · 1 of 1 slices shown]
[im 1/1]
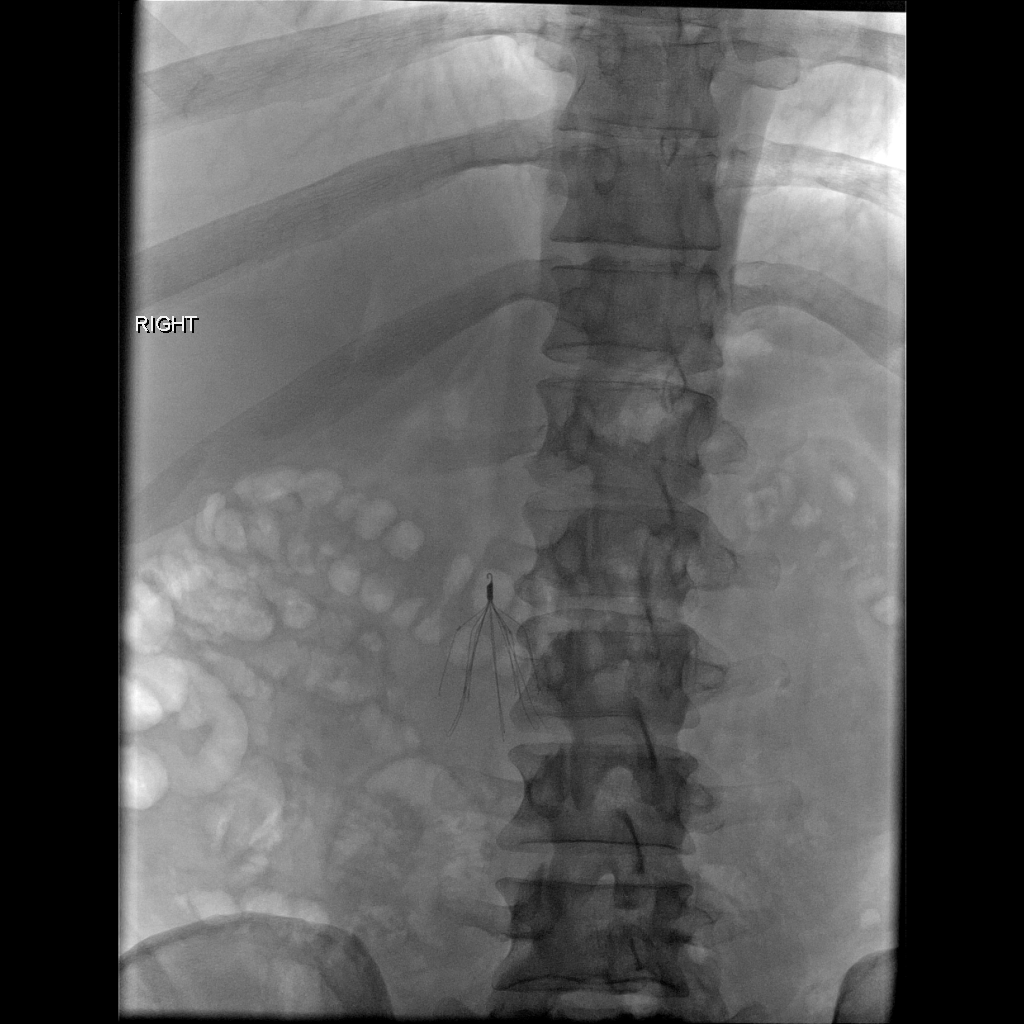

[5 of 5 positions shown; findings below may reference images not displayed]

IMAGES IMPORTED FROM THE [employer] WORKFLOW SYSTEM
NO DICTATION FOR STUDY

## 2013-11-23 IMAGING — CR RIGHT HAND - COMPLETE 3+ VIEW
1 series · 3 of 3 positions shown · non-contrast
Comparison: none

REASON FOR EXAM: dresser landed on hand
COMMENTS:

[Series 1: pa · 0.17mm/px · 3 of 3 slices shown]
[im 1/3]
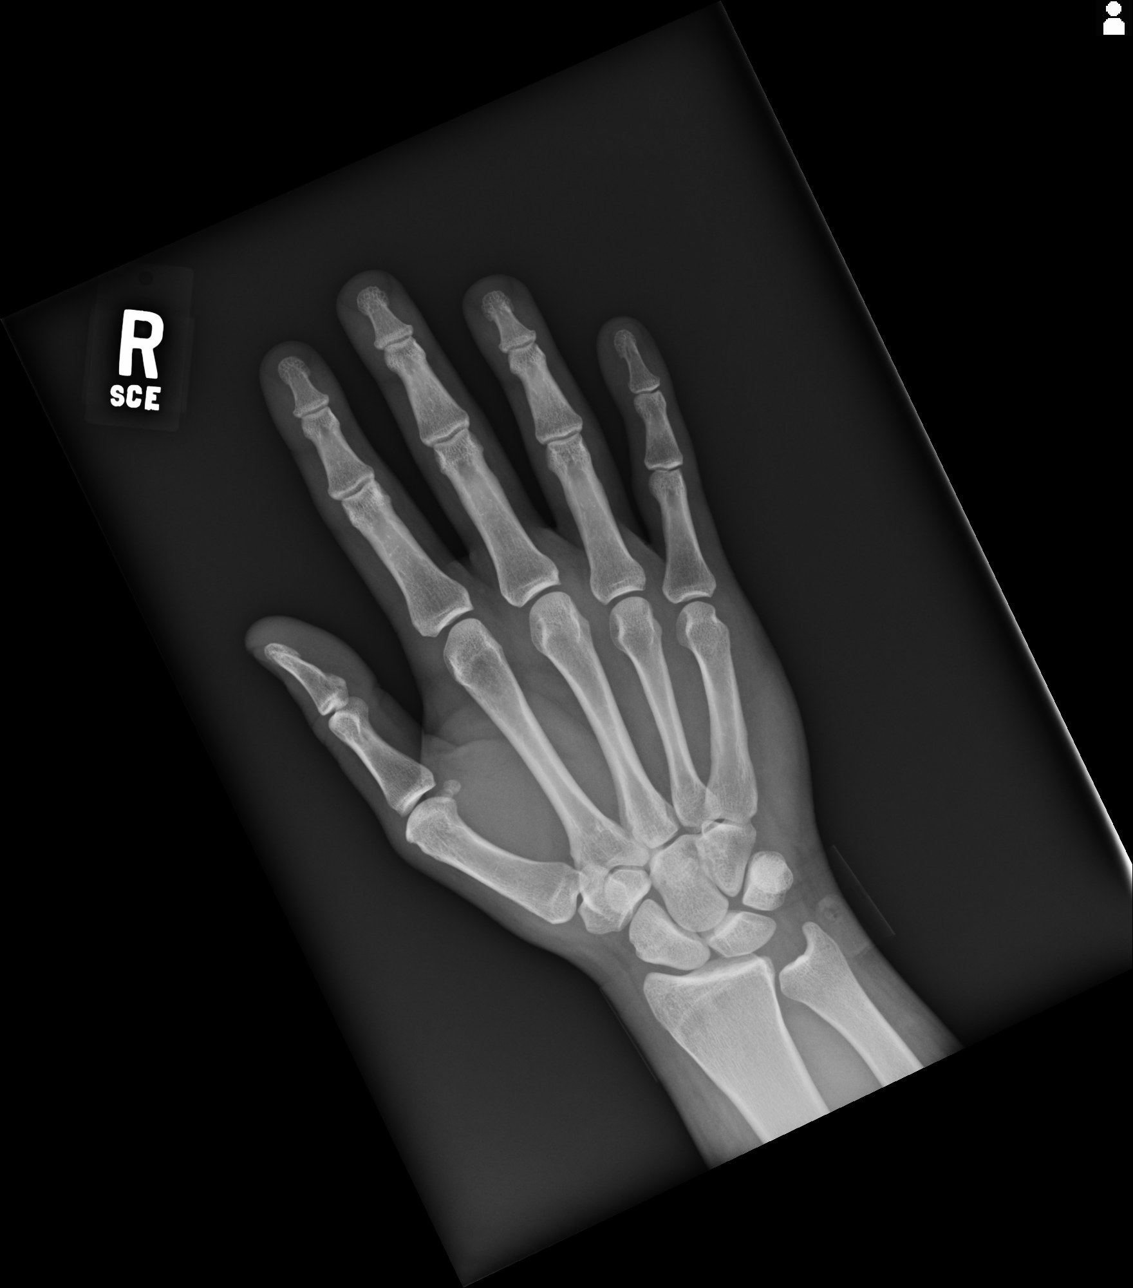
[im 2/3]
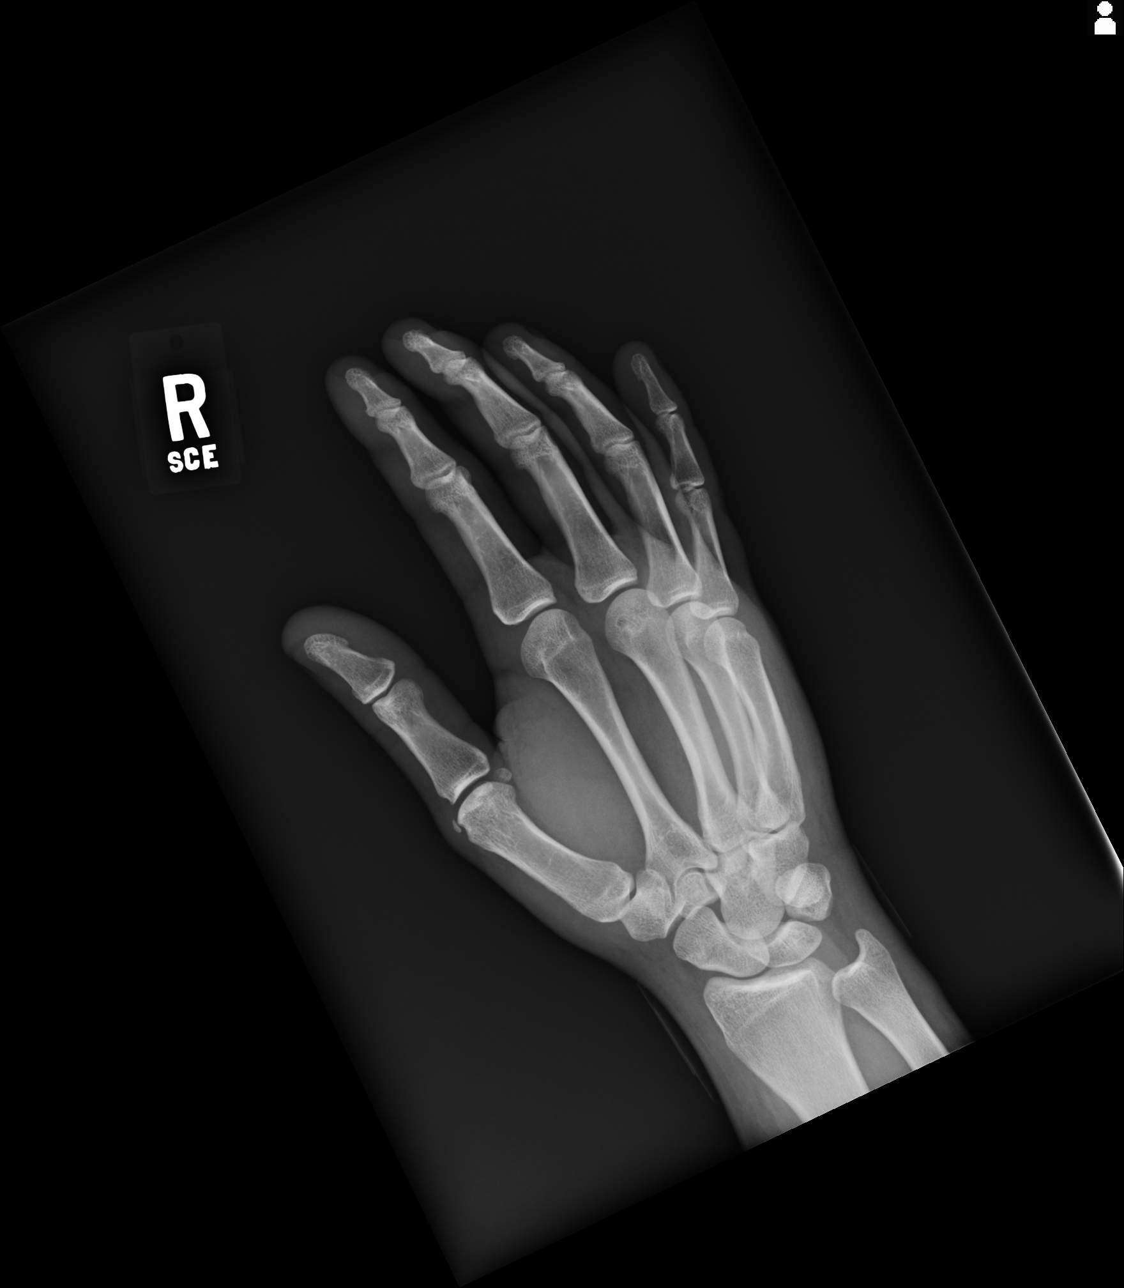
[im 3/3]
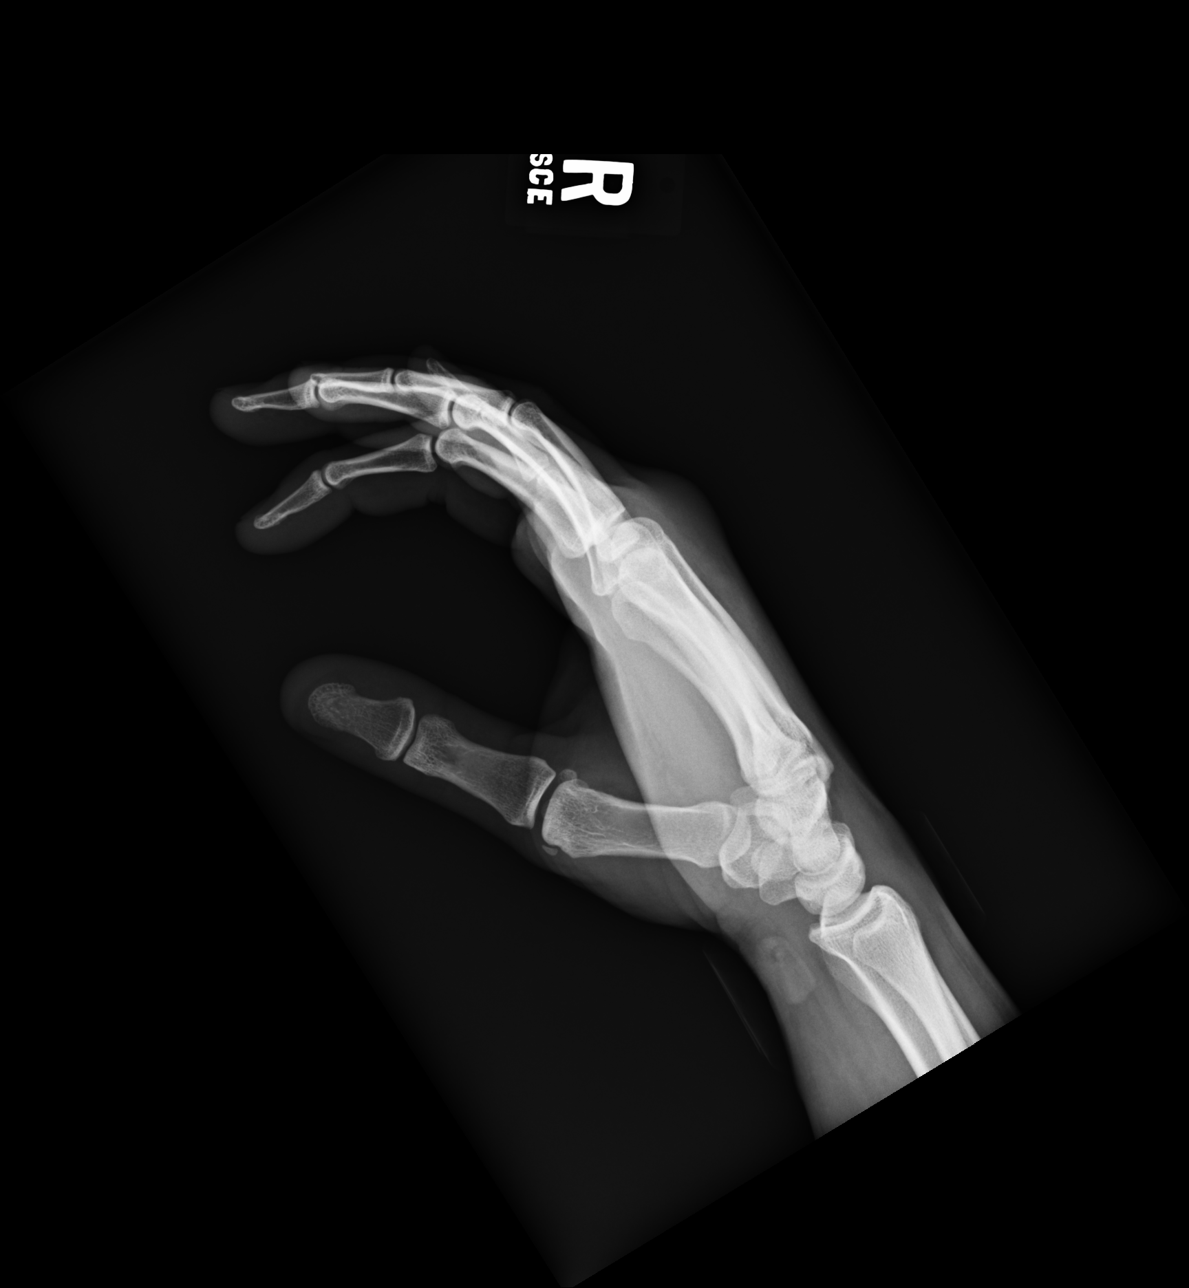

[3 of 3 positions shown; findings below may reference images not displayed]

PROCEDURE:     DXR - DXR HAND RT COMPLETE W/OBLIQUES  - February 15, 2012  [DATE]

RESULT:     Right hand images demonstrate artifact over the wrist region
that appears to be from the patient's identification band. There is evidence
of an avulsed fragment of uncertain chronicity at the distal portion of the
first metacarpal laterally. Correlate clinically. The bony structures
otherwise appear intact.
IMPRESSION: Please see above.

[REDACTED]

## 2013-12-03 IMAGING — CR DG LUMBAR SPINE 2-3V
1 series · 3 of 3 positions shown · non-contrast
Comparison: none

REASON FOR EXAM: pain
COMMENTS:

[Series 1: t lumbar spine ap · 0.14mm/px · 3 of 3 slices shown]
[im 1/3]
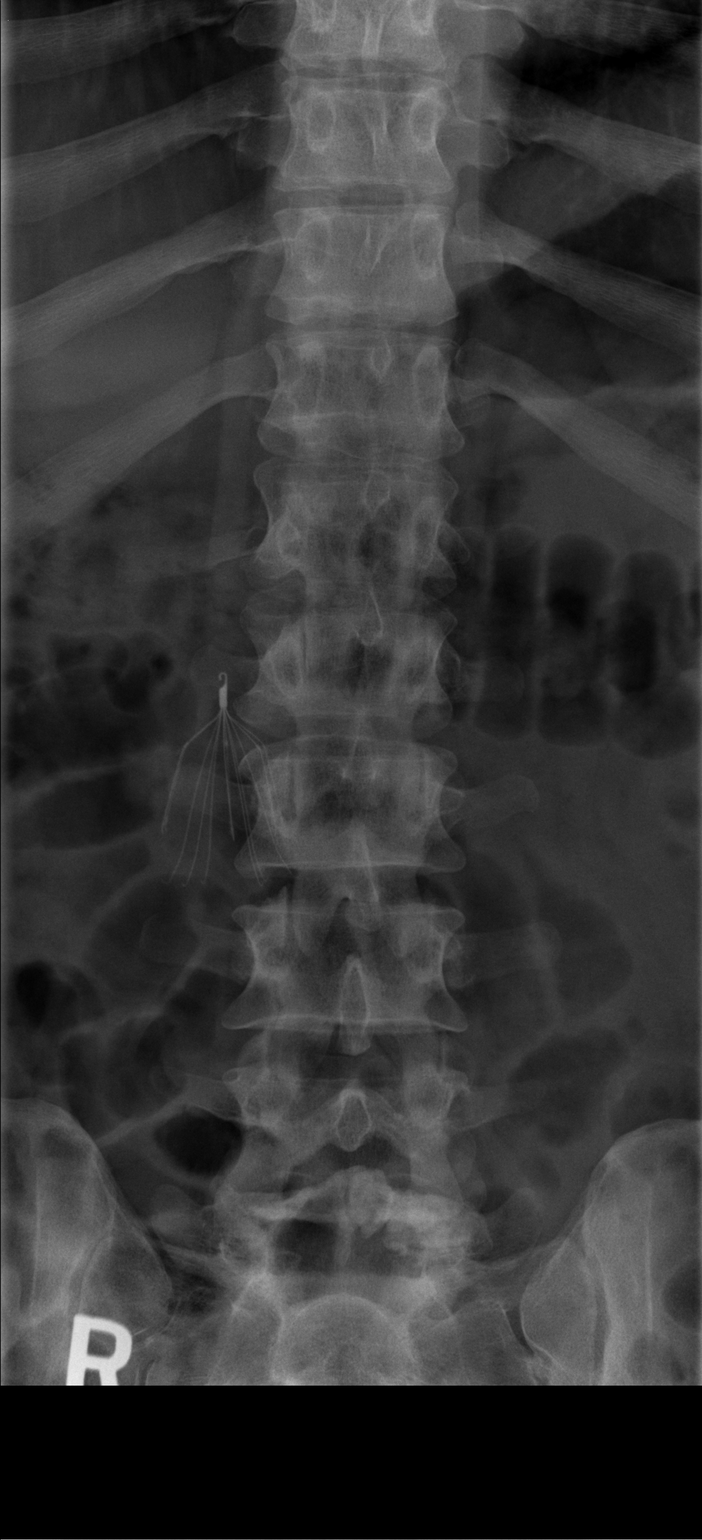
[im 2/3]
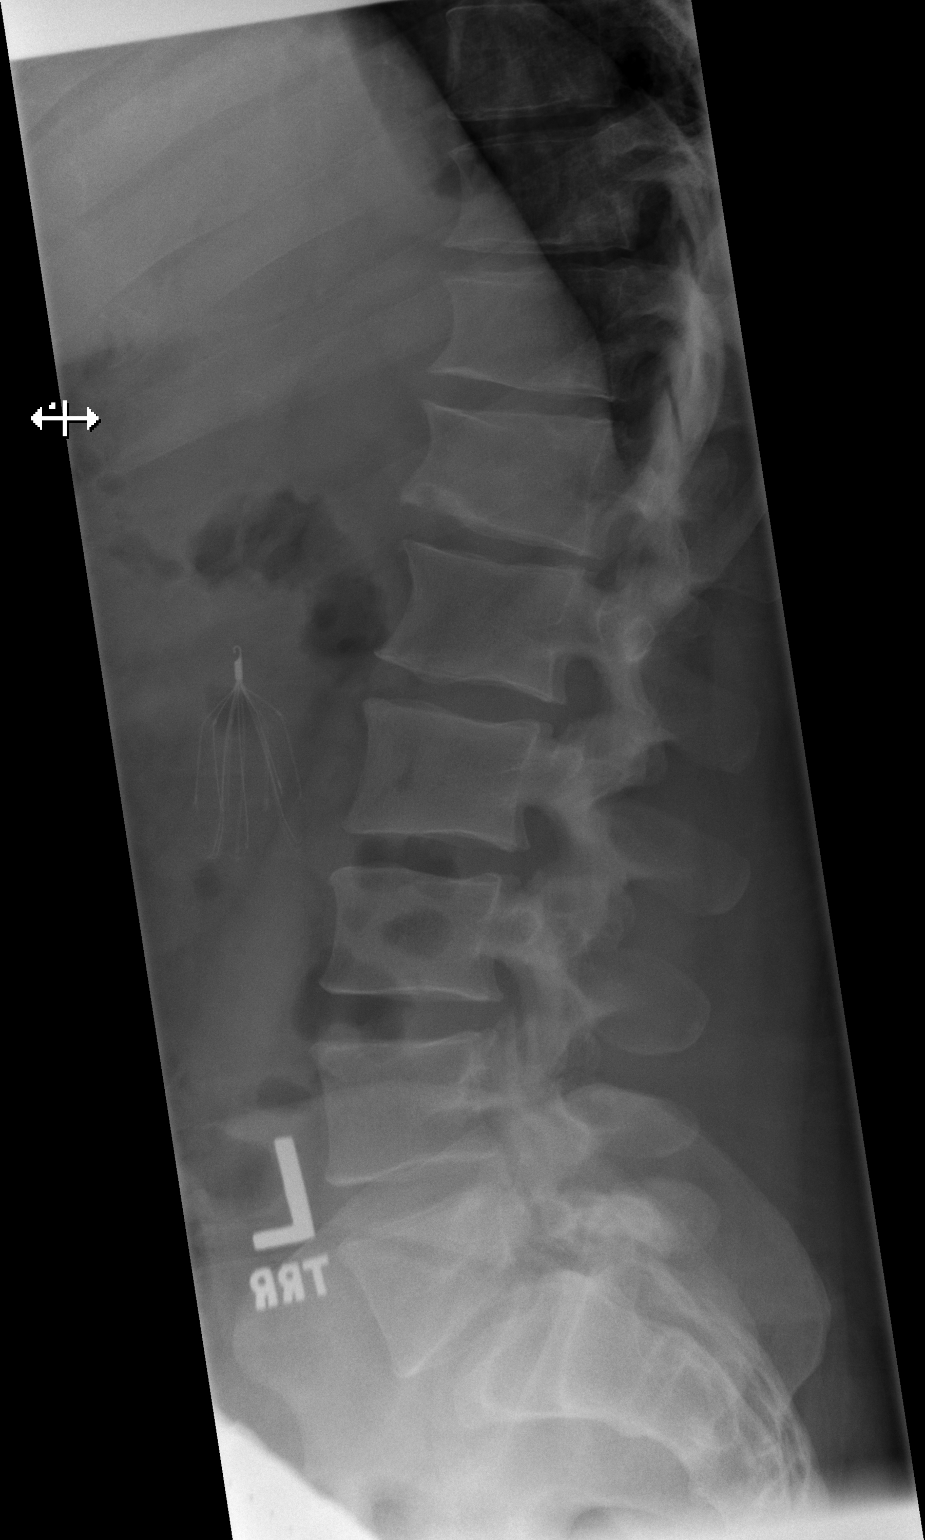
[im 3/3]
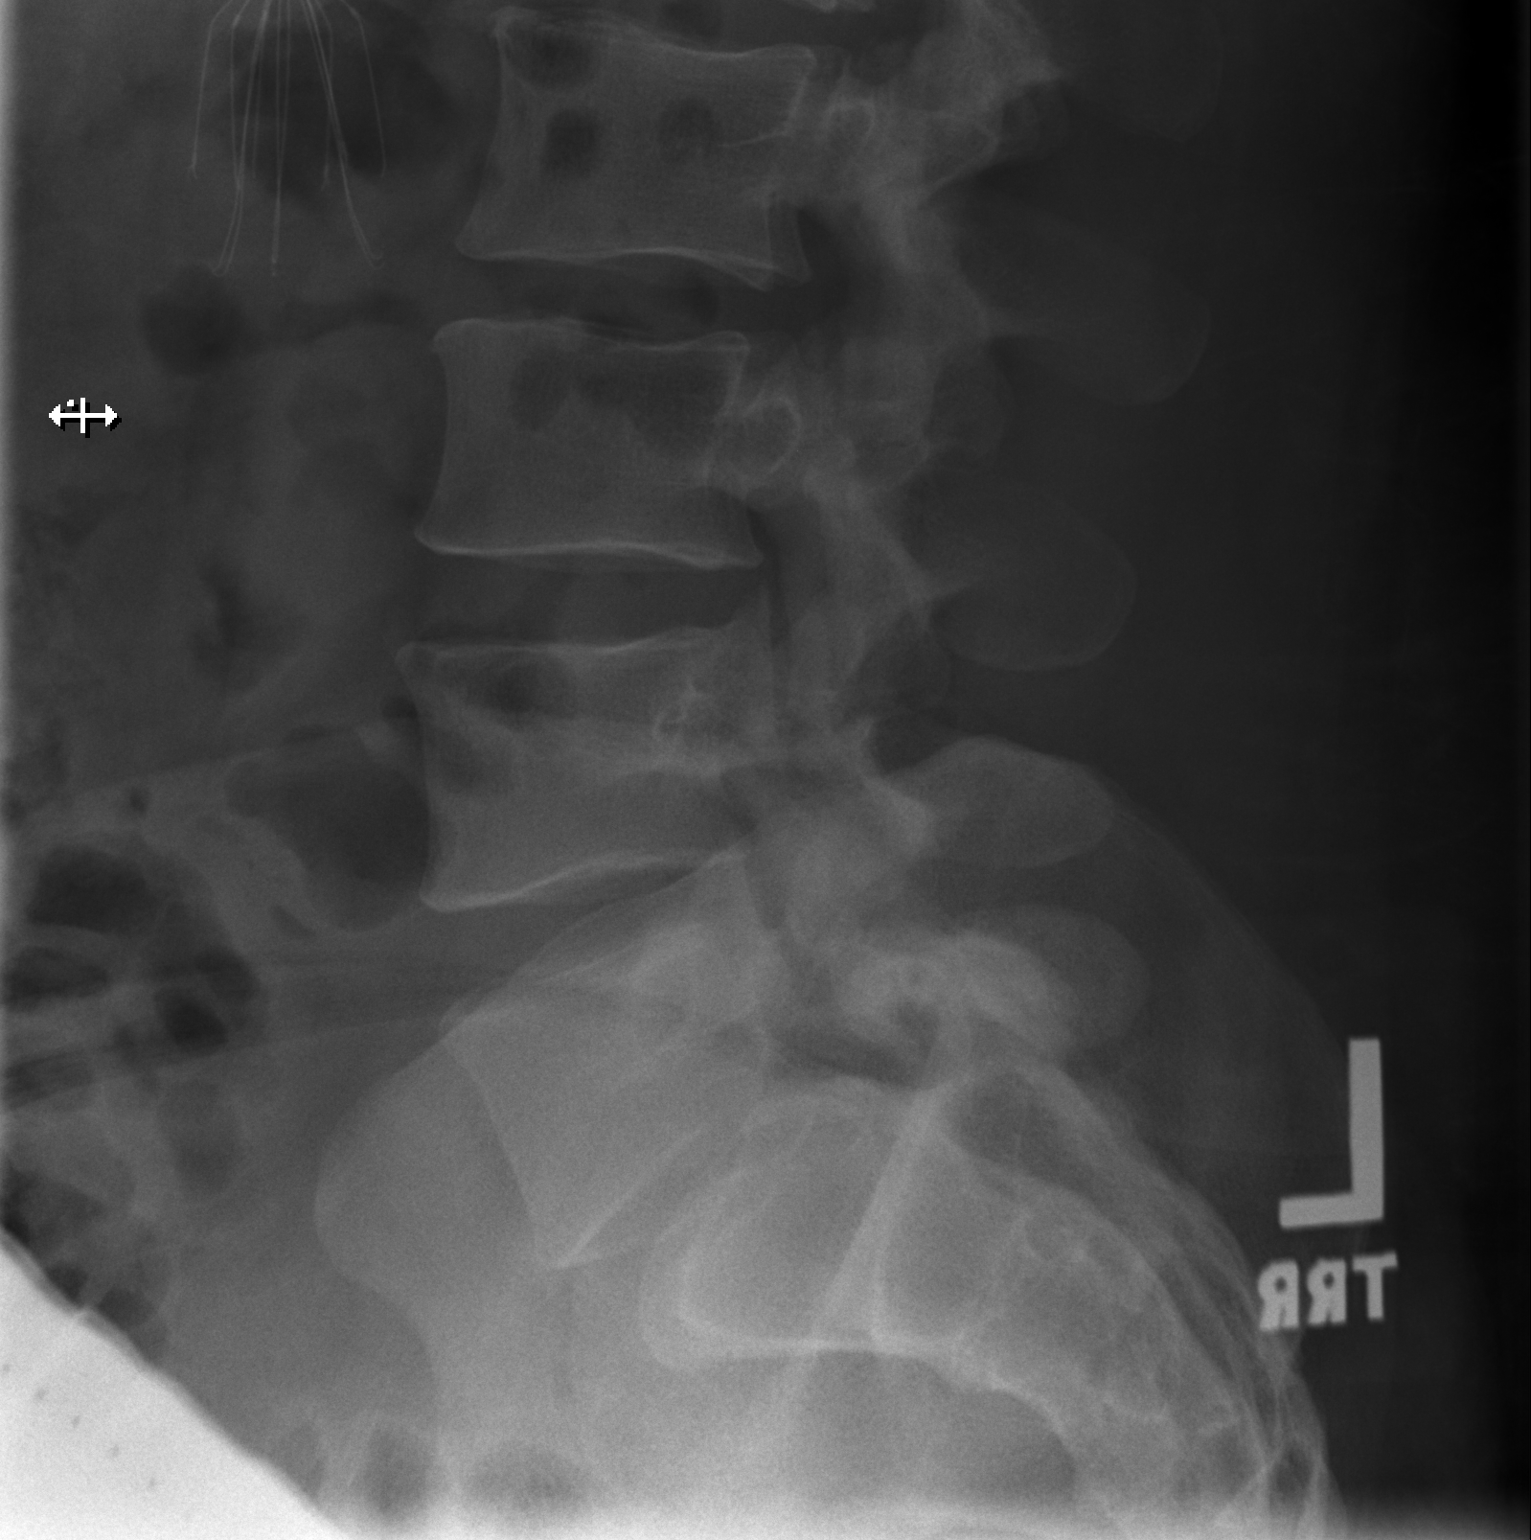

[3 of 3 positions shown; findings below may reference images not displayed]

PROCEDURE:     DXR - DXR LUMBAR SPINE AP AND LATERAL  - February 25, 2012 [DATE]

RESULT:     The vertebral body heights and the intervertebral disc spaces
are well maintained. The vertebral body alignment is normal. Incidental note
is made of vestigial rib formation at T12. There is a Schmorl's node defect
along the anterior/inferior vertebral plate of T12. This is a finding of
doubtful clinical significance. A vena cava filter is noted lateral to the
L2 lumbar vertebral body.
IMPRESSION: No acute changes are identified.

[REDACTED]

## 2013-12-03 IMAGING — CT CT HEAD WITHOUT CONTRAST
1 series · 16 of 30 positions shown, 20 images · non-contrast
Comparison: none

REASON FOR EXAM: seizure
COMMENTS:   May transport without cardiac monitor

PROCEDURE:     CT  - CT HEAD WITHOUT CONTRAST  - February 25, 2012 [DATE]
RESULT:     Comparison:  None
TECHNIQUE: Multiple axial images from the foramen magnum to the vertex were
obtained without IV contrast.

[Series 2: soft tissue · axial · 0.45mm/px · z∈[-96,+48]mm · 16 of 33 slices shown, 20 images]
[im 2/33  brain]
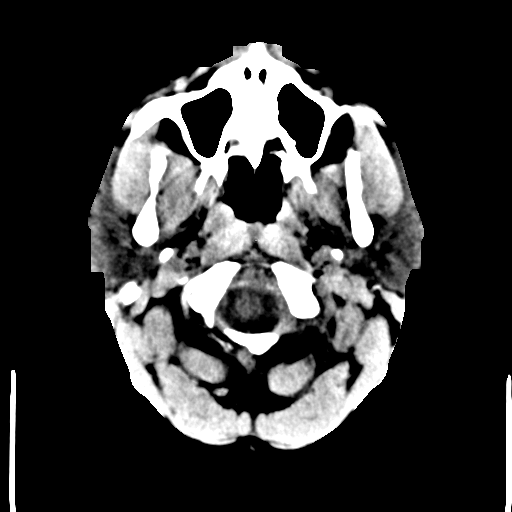
[im 2/33  bone]
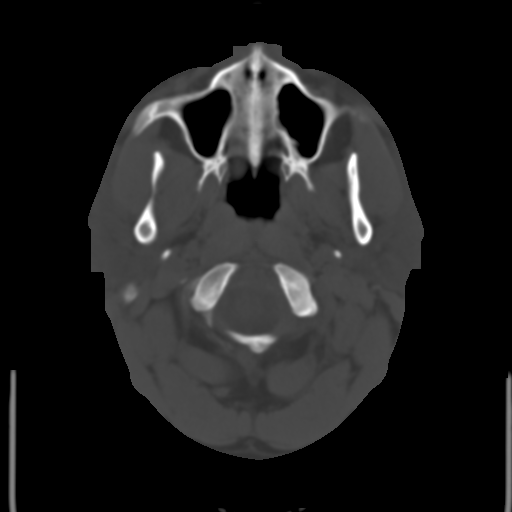
[im 4/33  brain]
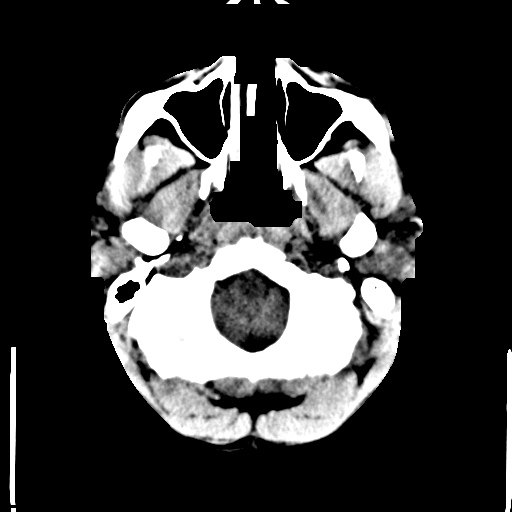
[im 6/33  brain]
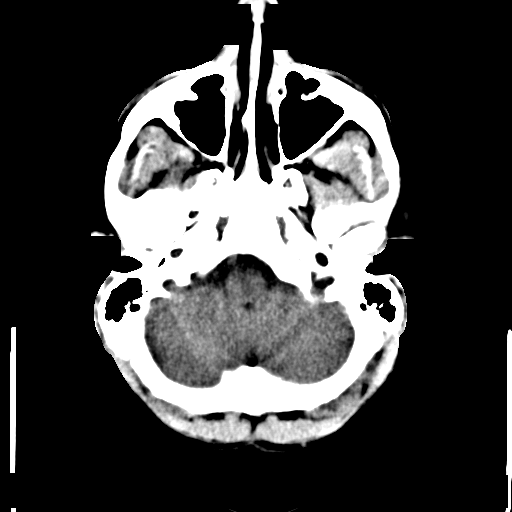
[im 8/33  brain]
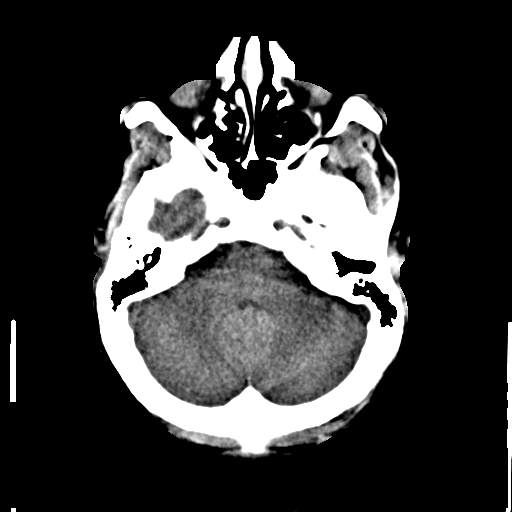
[im 9/33  brain]
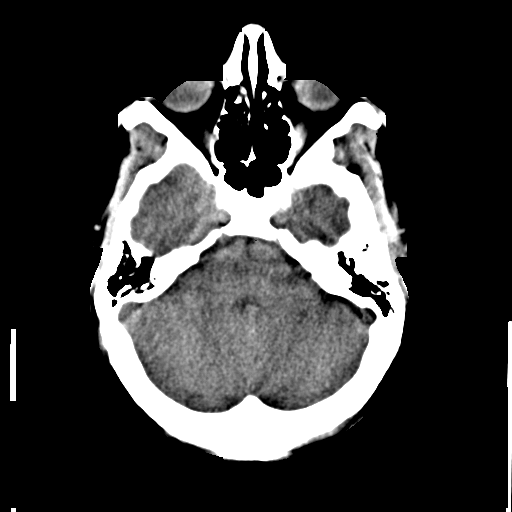
[im 9/33  bone]
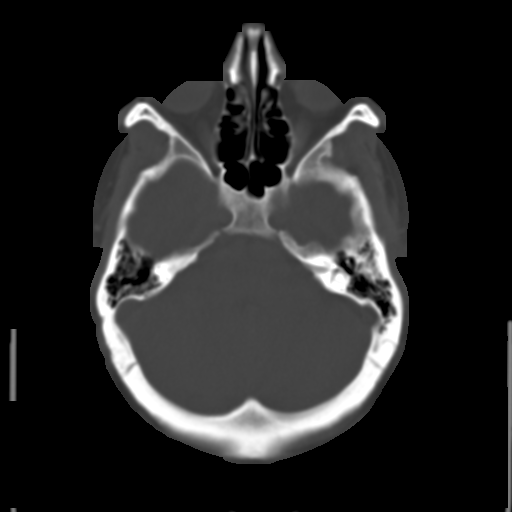
[im 12/33  brain]
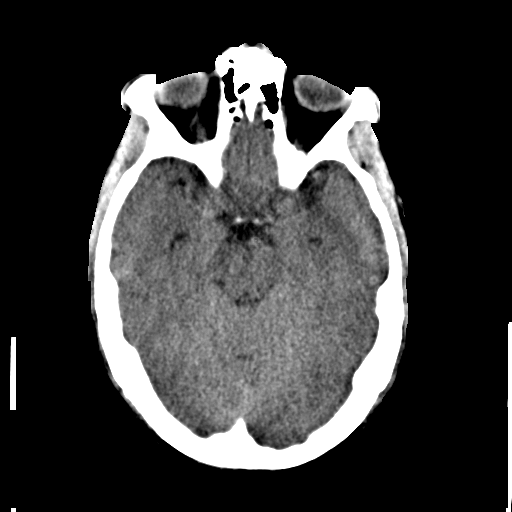
[im 14/33  brain]
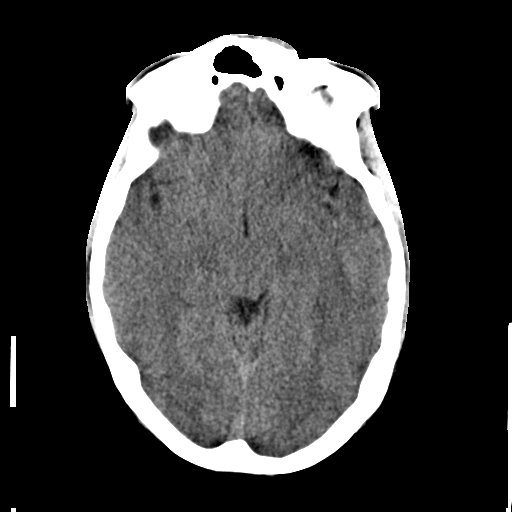
[im 16/33  brain]
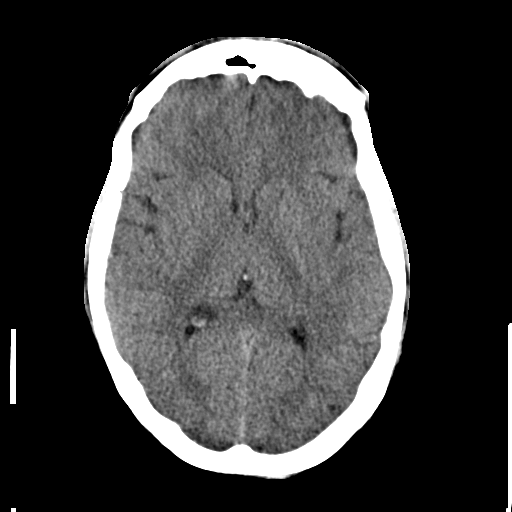
[im 17/33  brain]
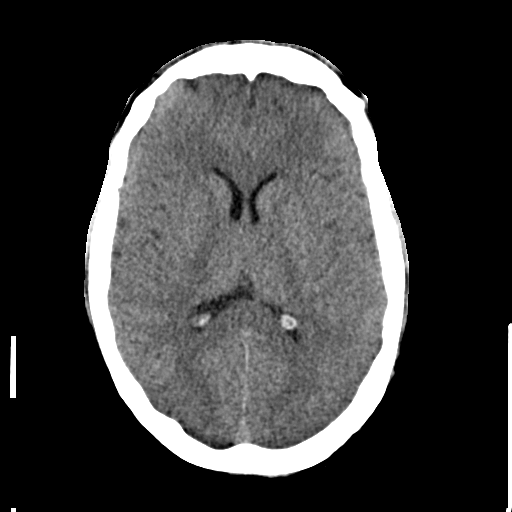
[im 17/33  bone]
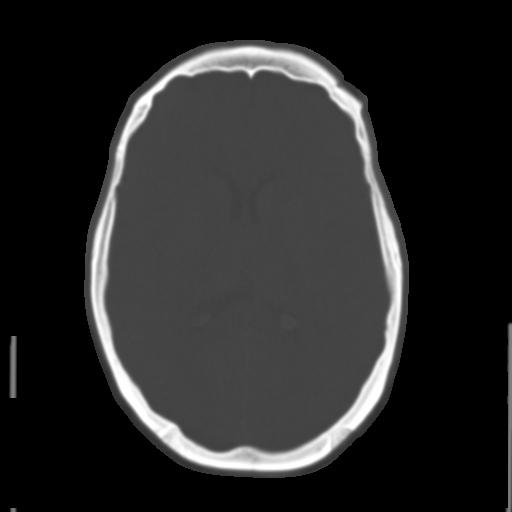
[im 19/33  brain]
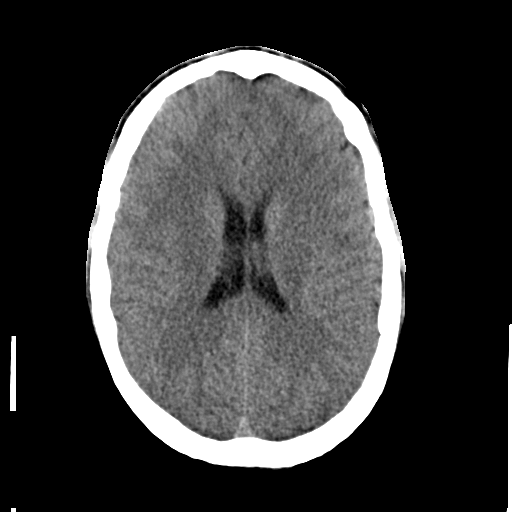
[im 21/33  brain]
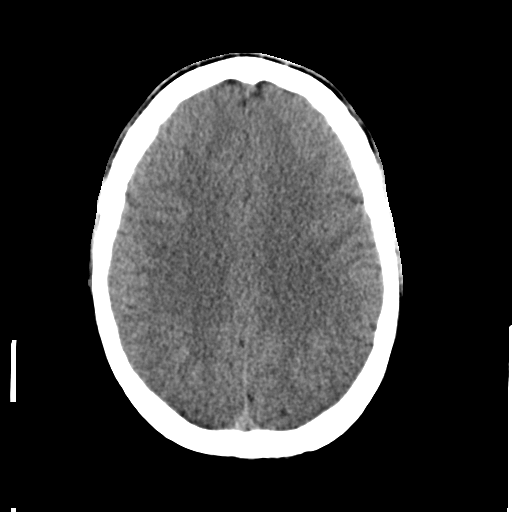
[im 24/33  brain]
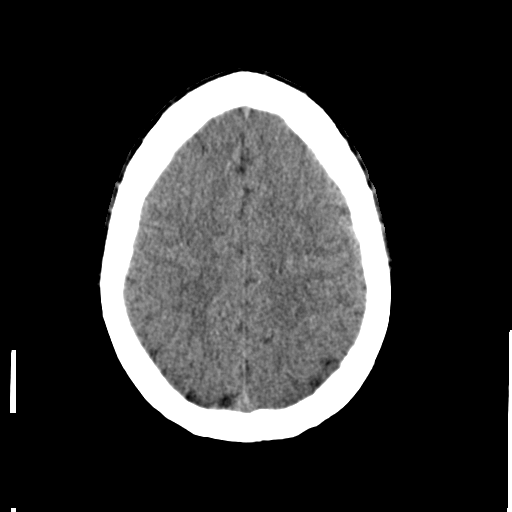
[im 25/33  brain]
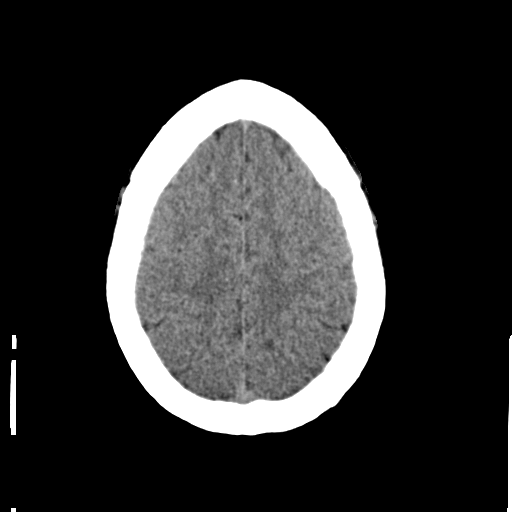
[im 25/33  bone]
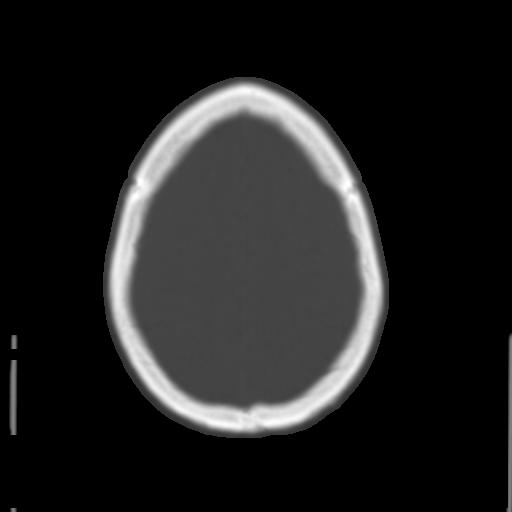
[im 27/33  brain]
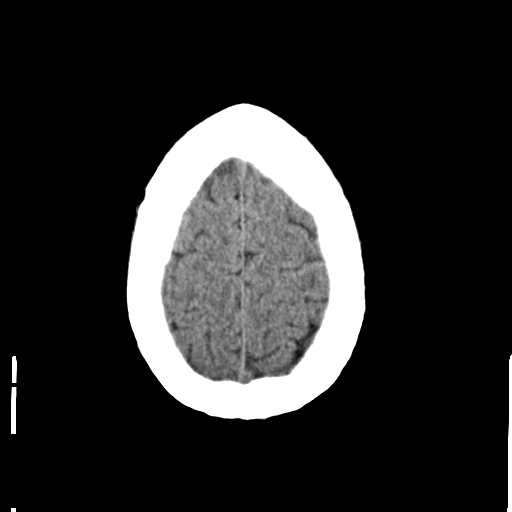
[im 29/33  brain]
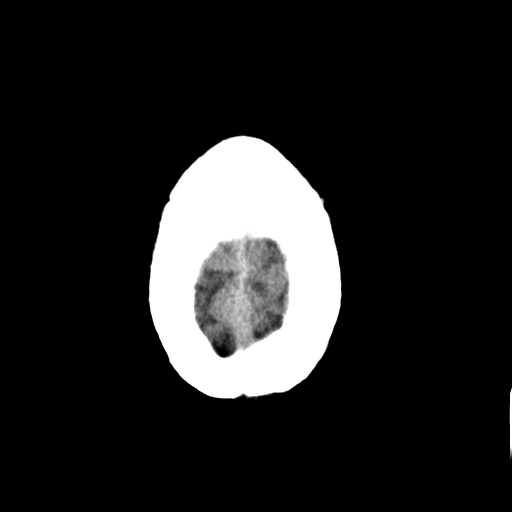
[im 31/33  brain]
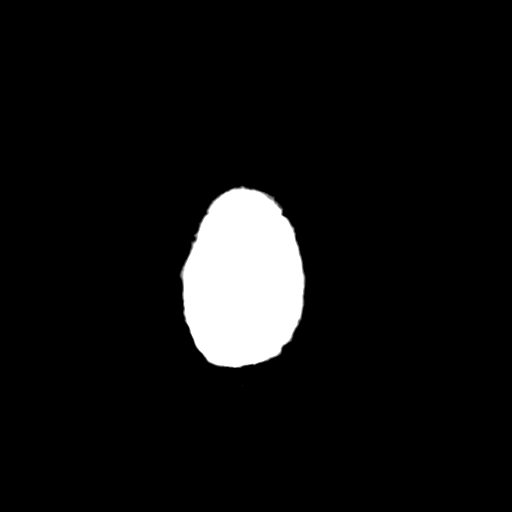

[16 of 30 positions shown; findings below may reference images not displayed]

FINDINGS: There is no evidence for mass effect, midline shift, or extra-axial fluid
collections. There is no evidence for space-occupying lesion, intracranial
hemorrhage, or cortical-based area of infarction. There is mild lobulation
of the superior sagittal sinus at the vertex.

The osseous structures are unremarkable.
IMPRESSION: No acute intracranial process.

## 2013-12-03 IMAGING — CT CT CERVICAL SPINE WITHOUT CONTRAST
3 series · 15 of 36 positions shown, 17 images · non-contrast
Comparison: none

REASON FOR EXAM: neck  pain
COMMENTS:

PROCEDURE:     CT  - CT CERVICAL SPINE WO  - February 25, 2012 [DATE]
RESULT:     Comparison: None.
TECHNIQUE: Multiple axial CT images were obtained of the cervical spine,
without intravenous contrast.  Sagittal and coronal reformatted images were
constructed.

[Series 3: axial · axial · 0.22mm/px · z∈[-237,-93]mm · 6 of 99 slices shown, 8 images]
[im 16/99  soft-tissue]
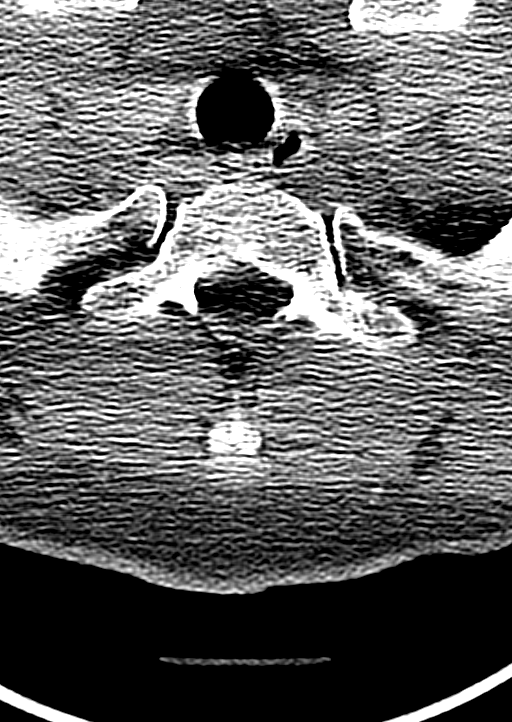
[im 16/99  bone]
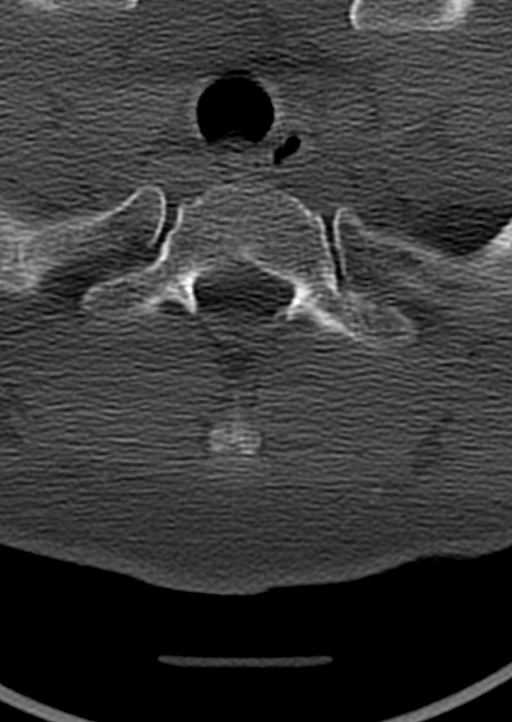
[im 31/99  bone]
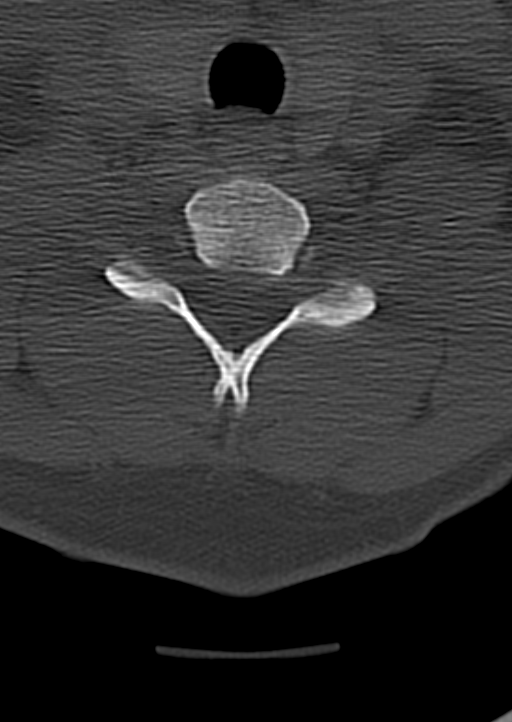
[im 46/99  bone]
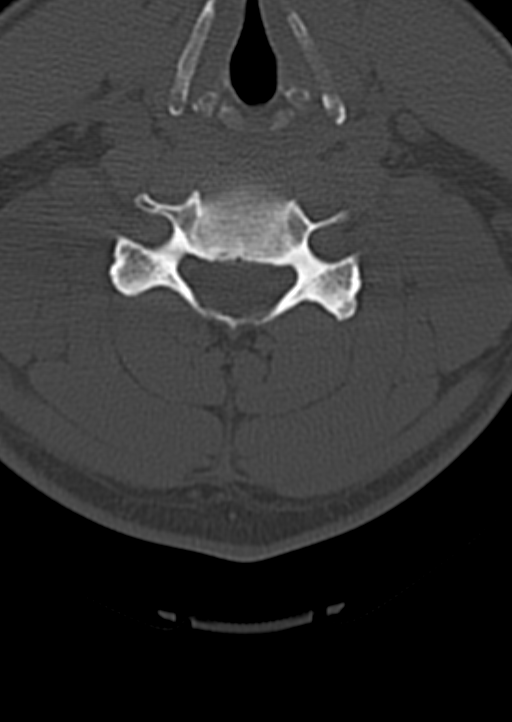
[im 61/99  bone]
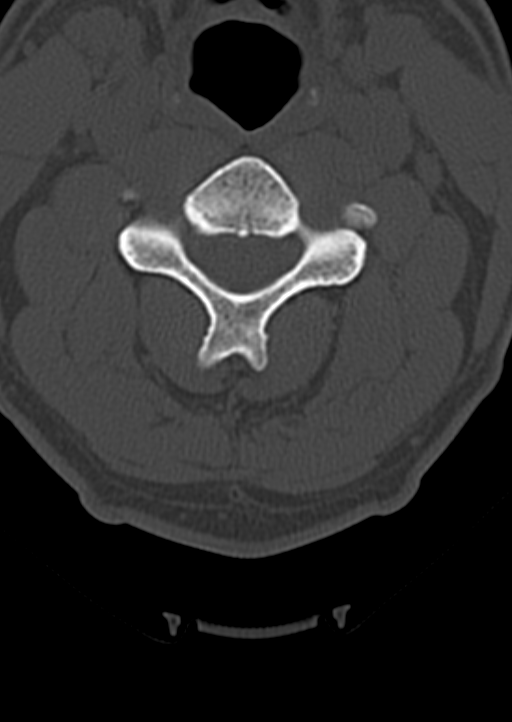
[im 76/99  soft-tissue]
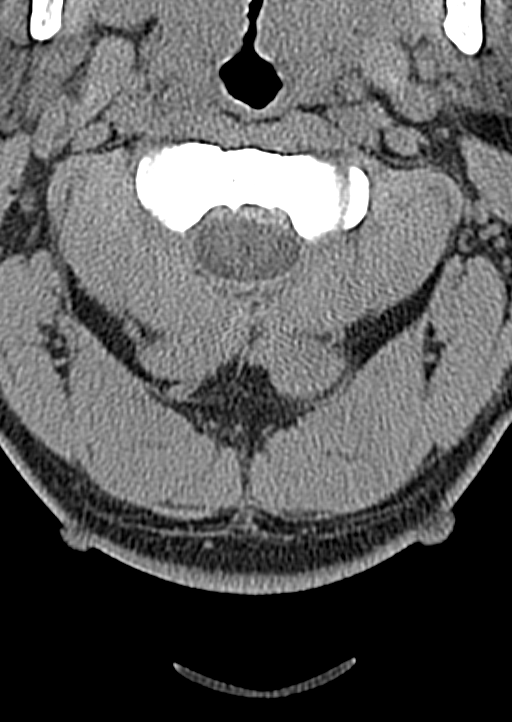
[im 76/99  bone]
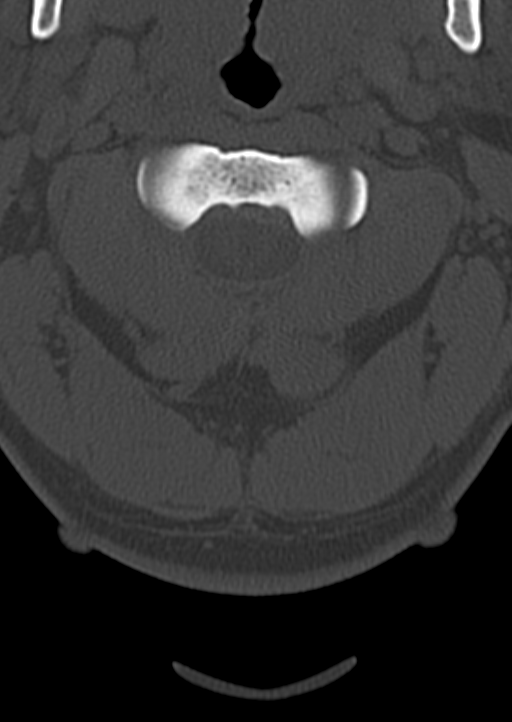
[im 91/99  bone]
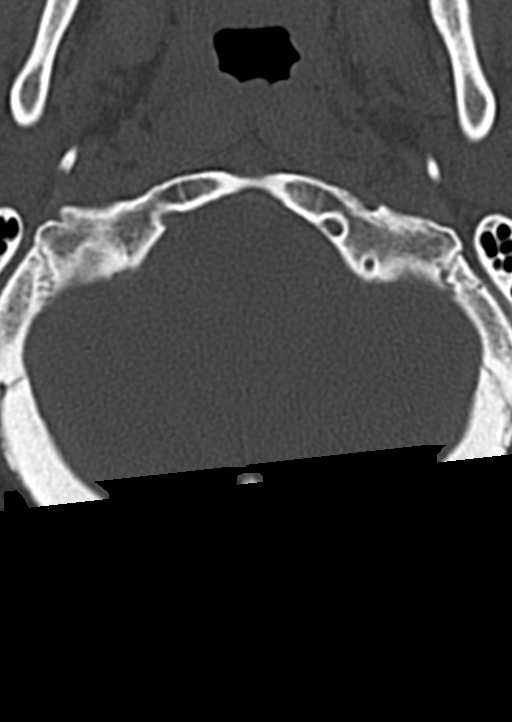

[Series 4: sagittal · sagittal · 0.46mm/px · 6 of 50 slices shown]
[im 25/50  soft-tissue]
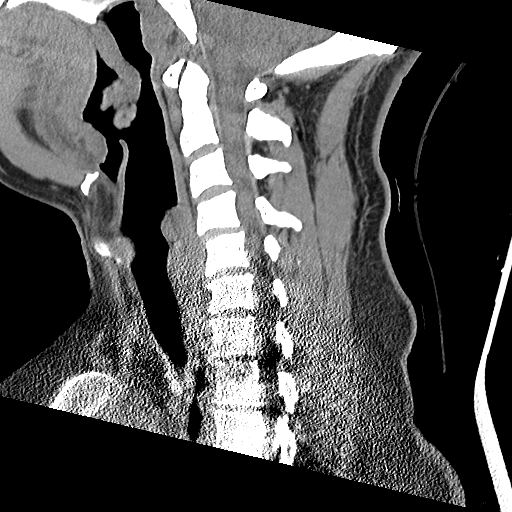
[im 26/50  bone]
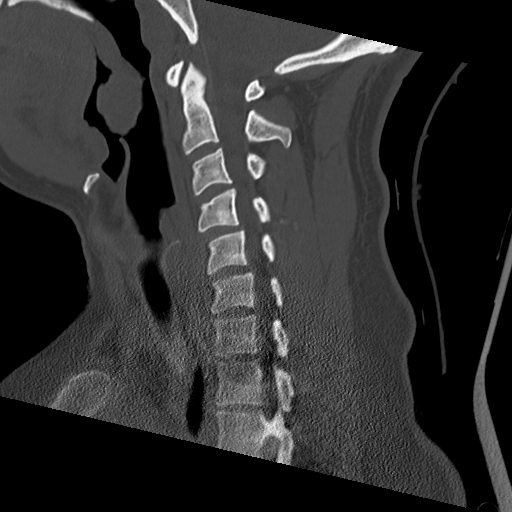
[im 29/50  bone]
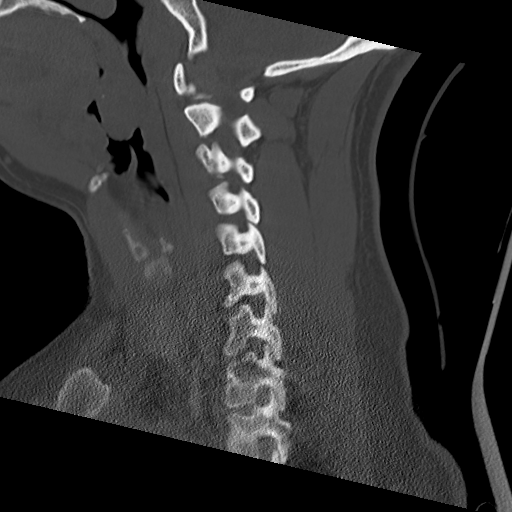
[im 32/50  bone]
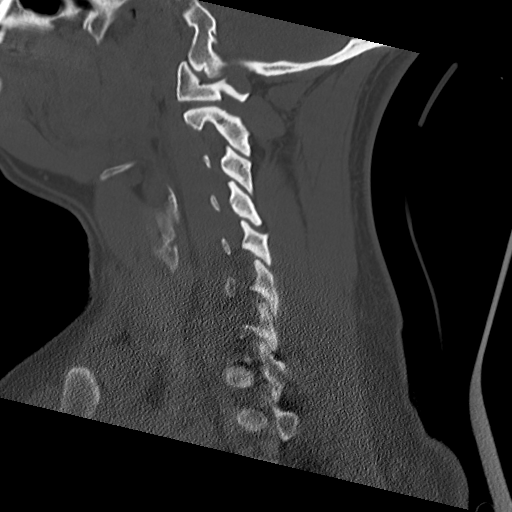
[im 34/50  bone]
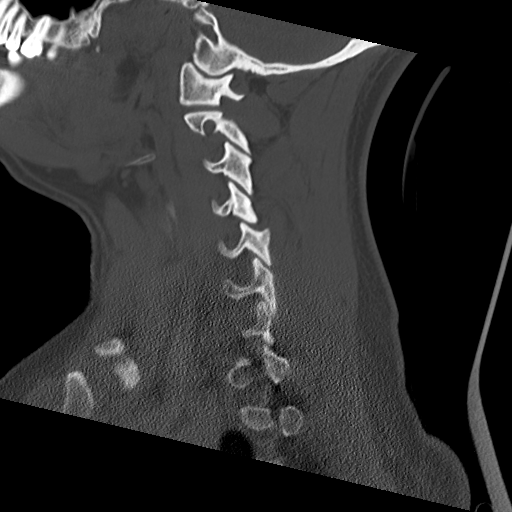
[im 37/50  bone]
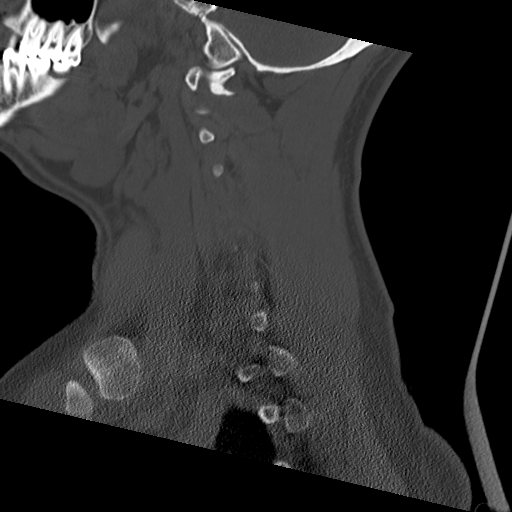

[Series 5: coronal · coronal · 0.46mm/px · 3 of 45 slices shown]
[im 9/45  bone]
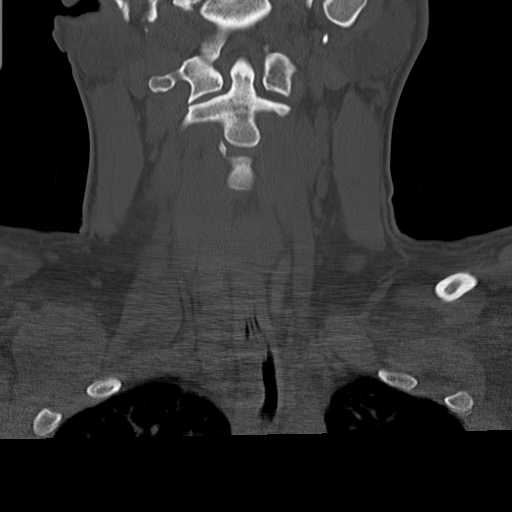
[im 18/45  bone]
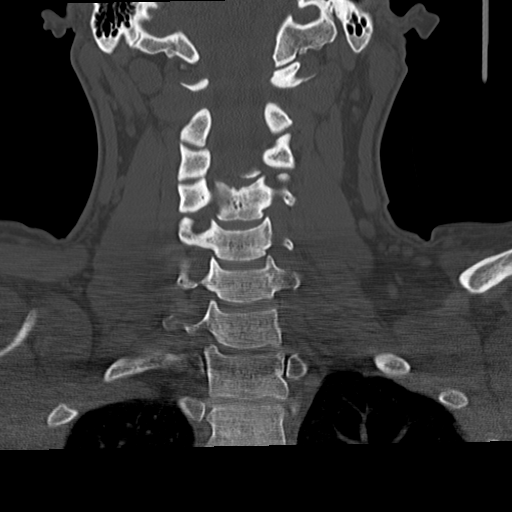
[im 27/45  bone]
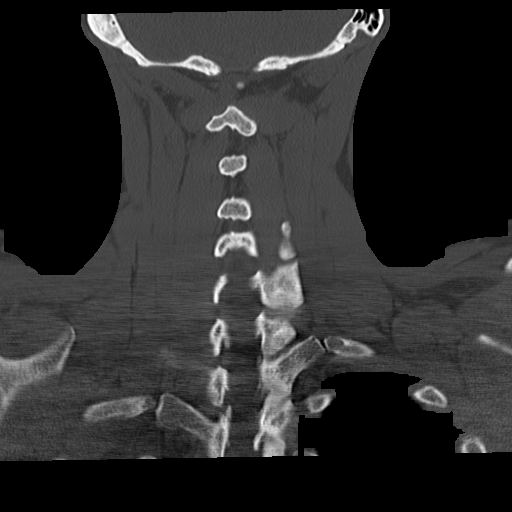

[15 of 36 positions shown; findings below may reference images not displayed]

FINDINGS: No evidence of cervical spine fracture or static listhesis.  Vertebral body
heights are maintained.  Prevertebral soft tissues are within normal limits.
There is reversal of the normal cervical lordosis, which is nonspecific.
IMPRESSION: No cervical spine fracture or static listhesis.  Ligamentous injury cannot
be excluded.

## 2014-03-29 IMAGING — US US EXTREM LOW VENOUS*L*
1 series · 14 of 24 positions shown · non-contrast
Comparison: none

REASON FOR EXAM: calf pain eval for DVT
COMMENTS:

PROCEDURE:     US  - US DOPPLER LOW EXTR LEFT  - June 20, 2012  [DATE]
RESULT:     Comparison: None

[Series 1: us extrem low venous*left* · 0.11mm/px · 14 of 29 slices shown]
[im 1/29]
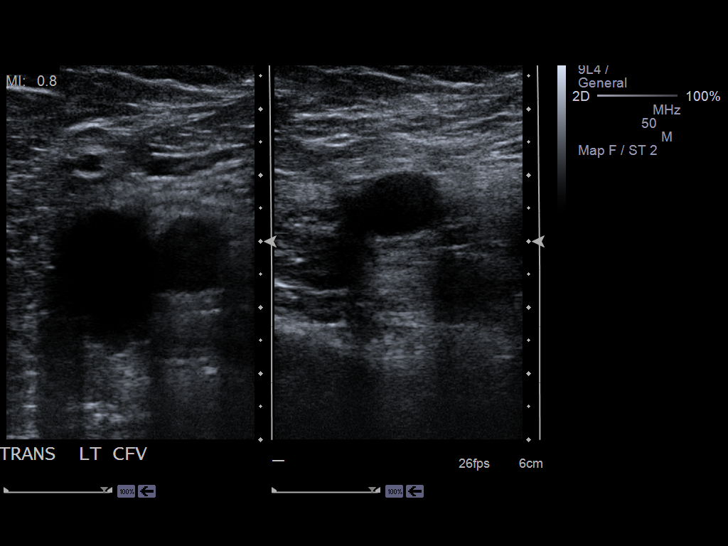
[im 3/29]
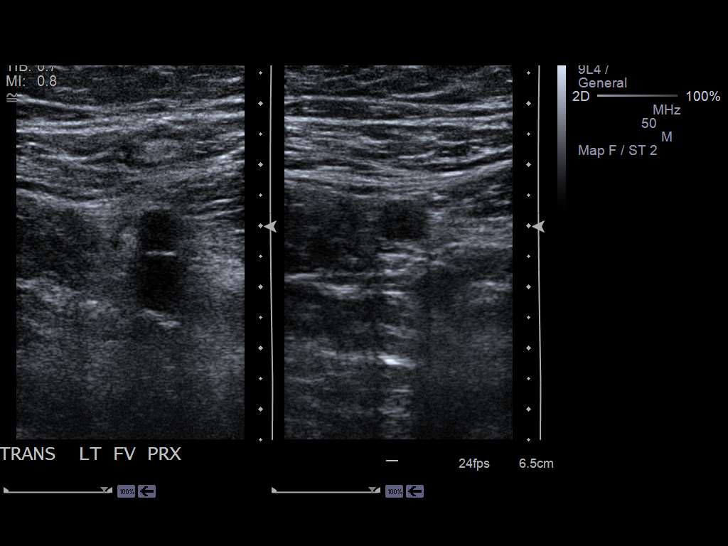
[im 5/29]
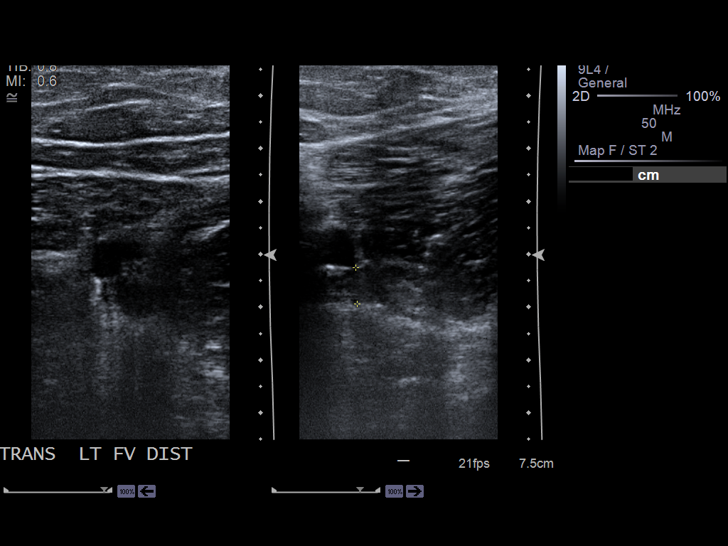
[im 8/29]
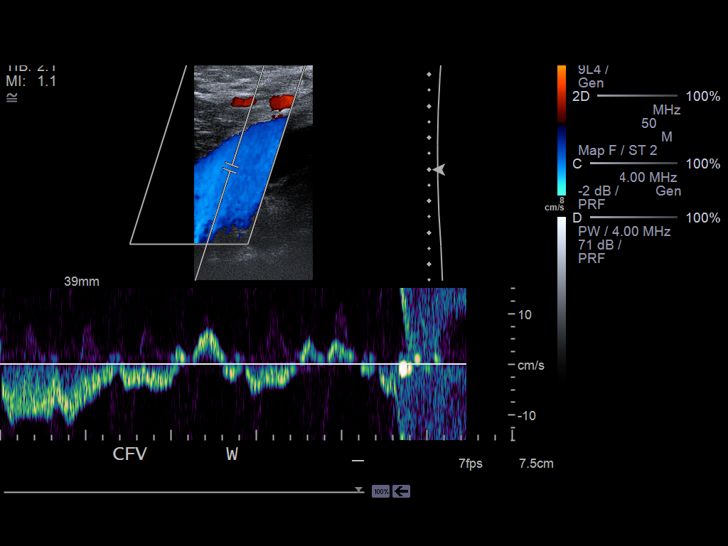
[im 9/29]
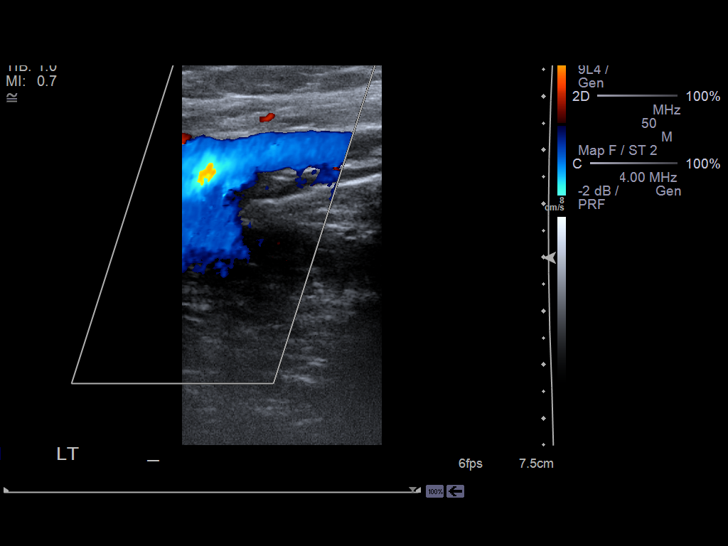
[im 11/29]
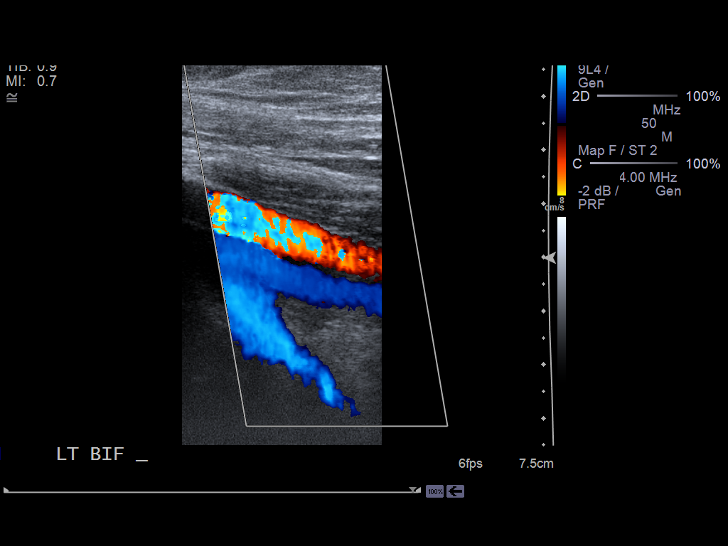
[im 14/29]
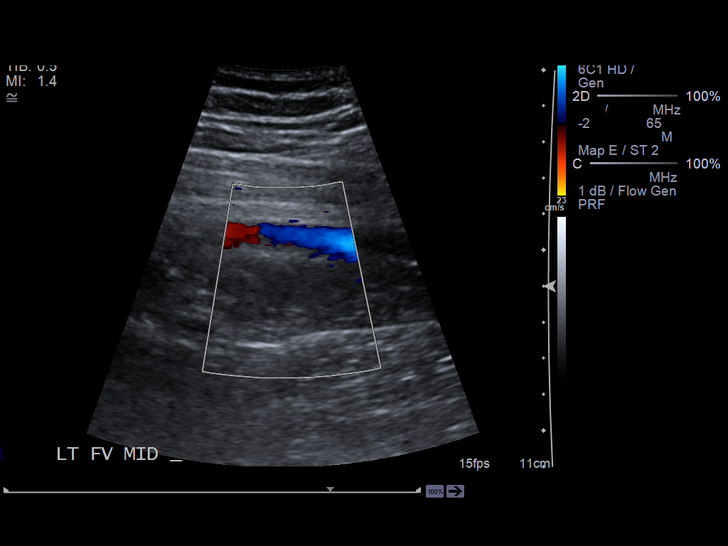
[im 15/29]
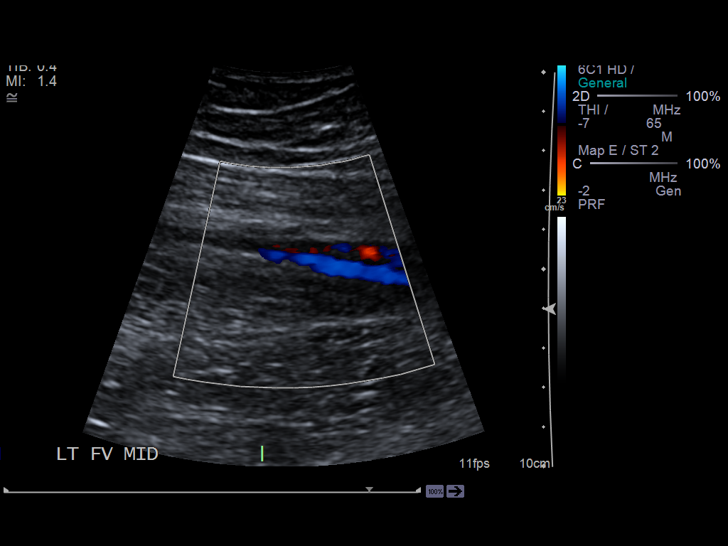
[im 18/29]
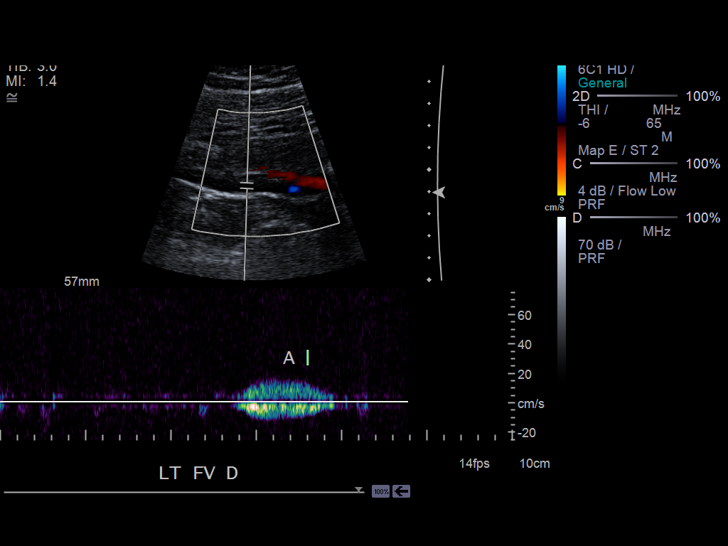
[im 20/29]
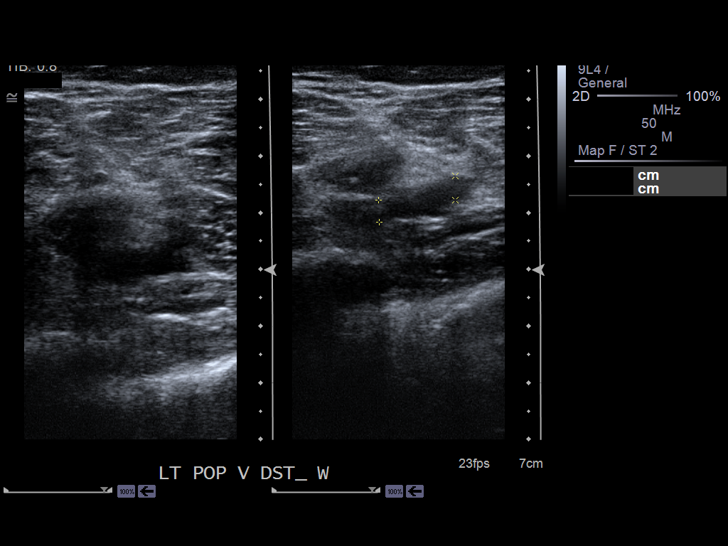
[im 22/29]
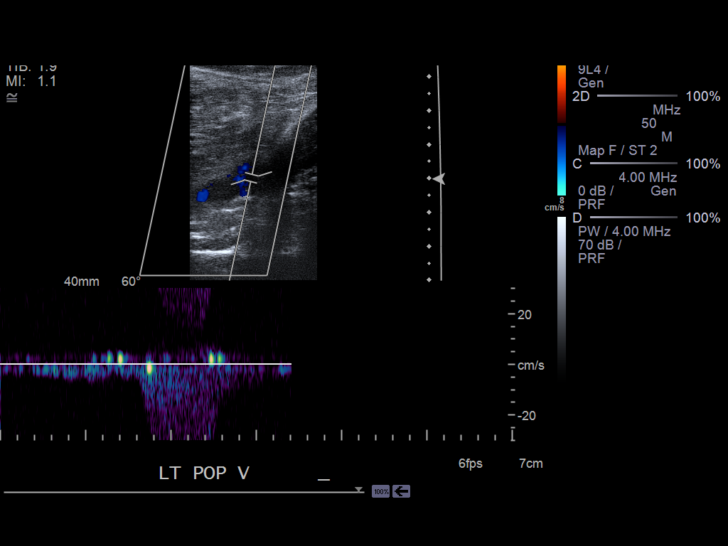
[im 24/29]
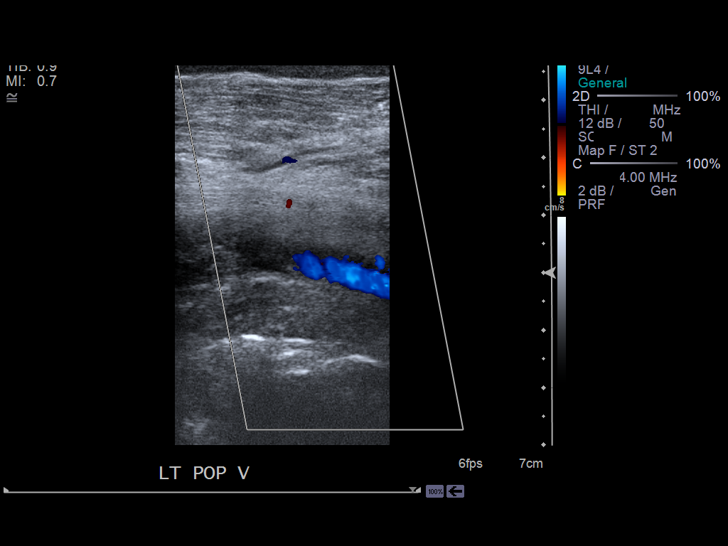
[im 26/29]
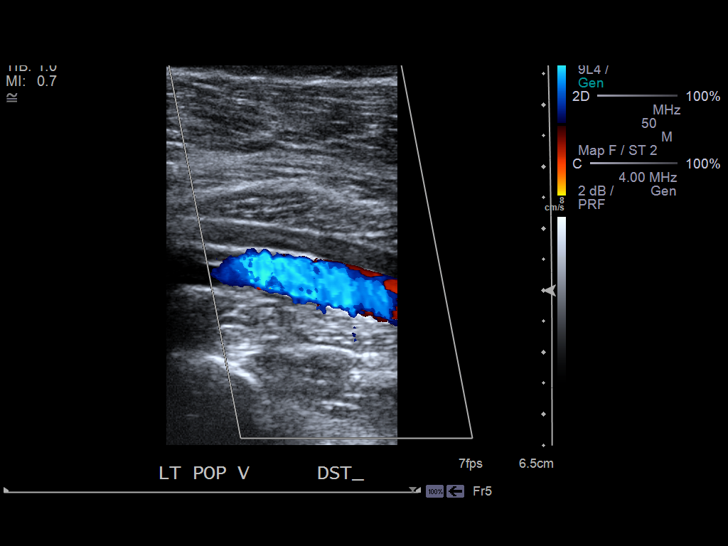
[im 29/29]
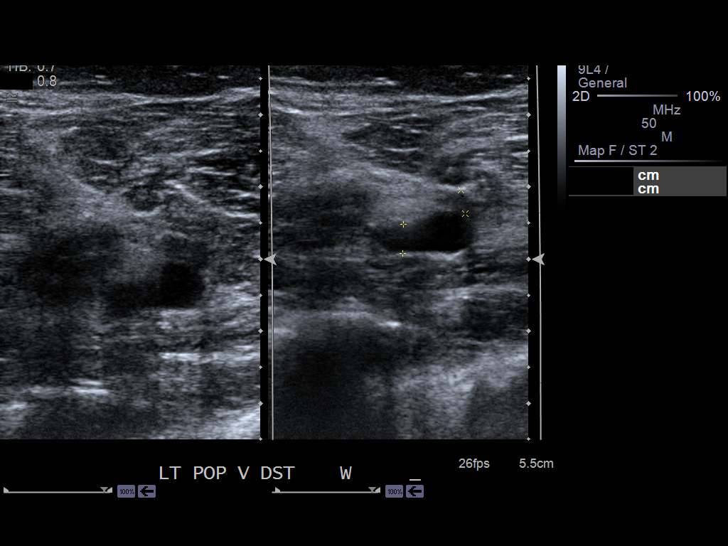

[14 of 24 positions shown; findings below may reference images not displayed]

FINDINGS: Multiple longitudinal and transverse gray-scale as well as color
and spectral Doppler images of the left lower extremity veins were obtained
from the common femoral veins through the popliteal veins.

The left common femoral vein is normally compressible without intraluminal
thrombus identified and normal respiratory variation and augmentation.

The femoral vein and popliteal veins are noncompressible concerning for deep
venous thrombosis. There is color flow demonstrated through segments of the
femoral vein and pop to a vein consistent with nonocclusive thrombus.
IMPRESSION: Deep venous thrombosis of the left femoral and popliteal veins.

[REDACTED]

## 2014-04-19 ENCOUNTER — Ambulatory Visit: Payer: Self-pay | Admitting: Otolaryngology

## 2014-04-19 ENCOUNTER — Emergency Department: Payer: Self-pay | Admitting: Emergency Medicine

## 2014-07-28 IMAGING — CT CT STONE STUDY
1 of 2 series · 15 of 32 positions shown, 19 images · non-contrast
Comparison: none

REASON FOR EXAM: r flank pain
COMMENTS:

[Series 2: 3mm soft tissue · axial · 0.93mm/px · z∈[-532,-112]mm · 15 of 154 slices shown, 19 images]
[im 7/154  soft-tissue]
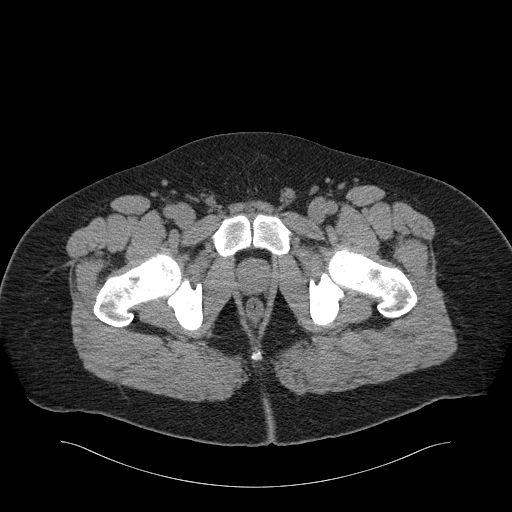
[im 7/154  bone]
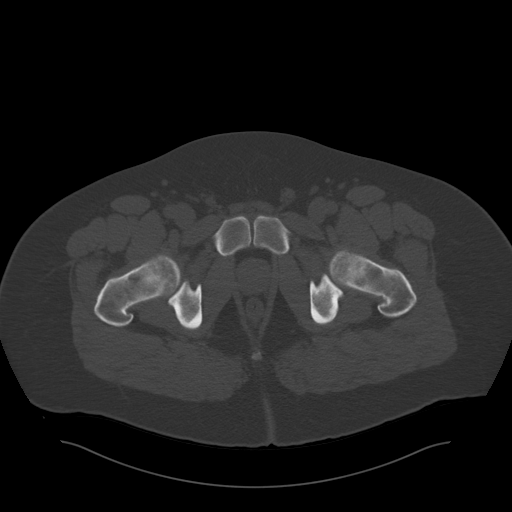
[im 20/154  soft-tissue]
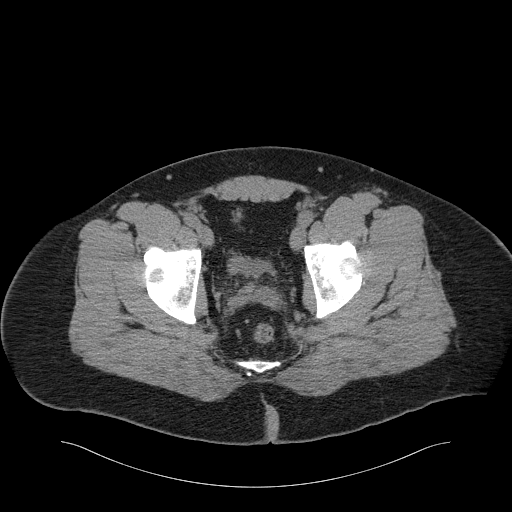
[im 34/154  soft-tissue]
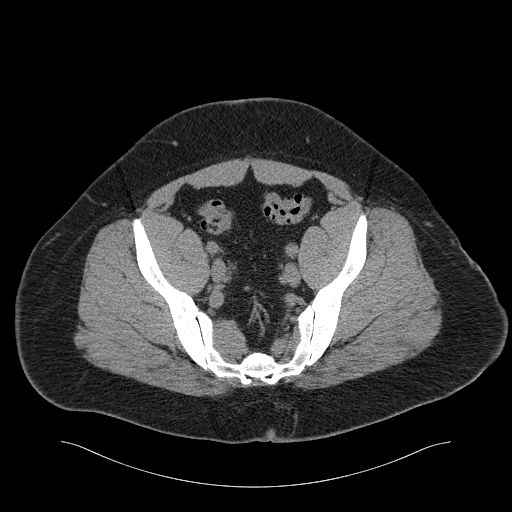
[im 40/154  soft-tissue]
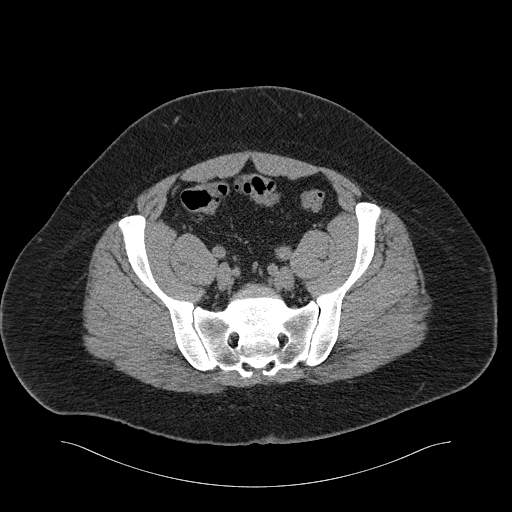
[im 54/154  soft-tissue]
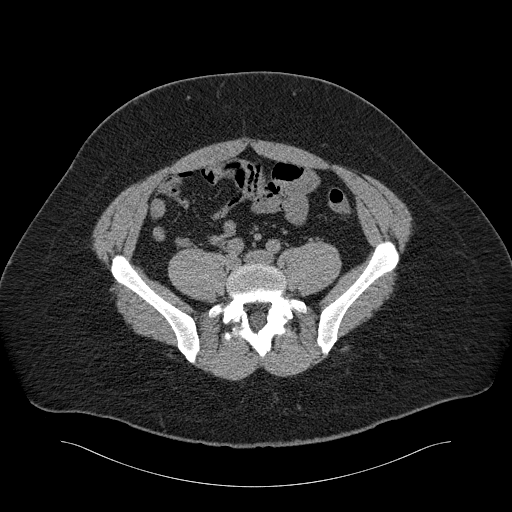
[im 67/154  soft-tissue]
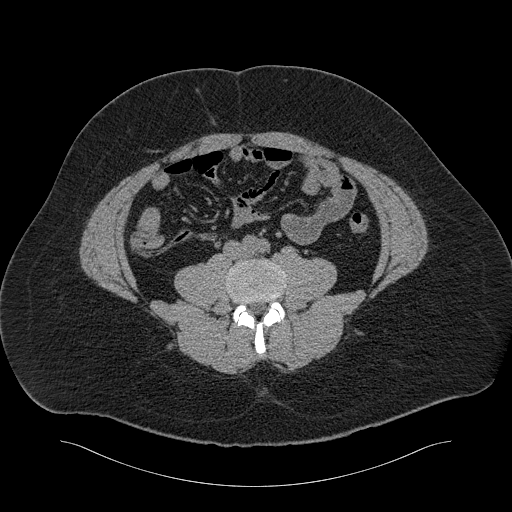
[im 80/154  soft-tissue]
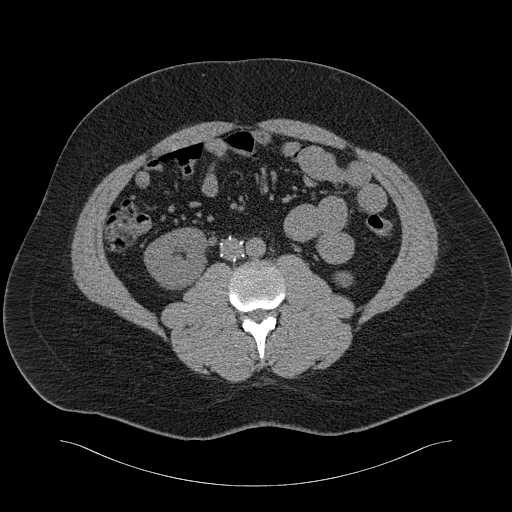
[im 87/154  soft-tissue]
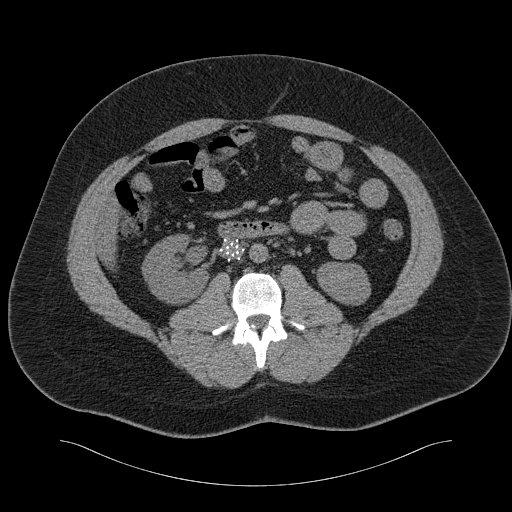
[im 100/154  soft-tissue]
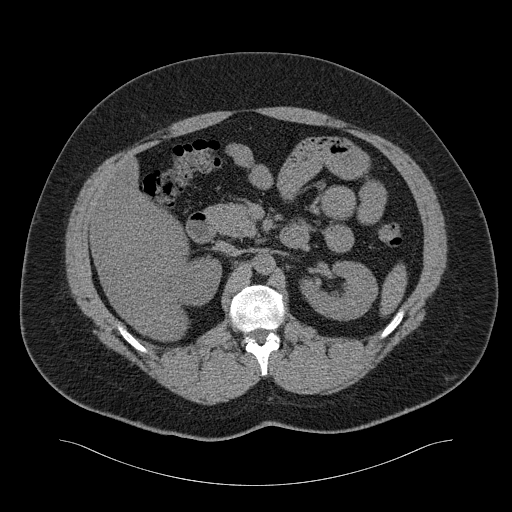
[im 100/154  bone]
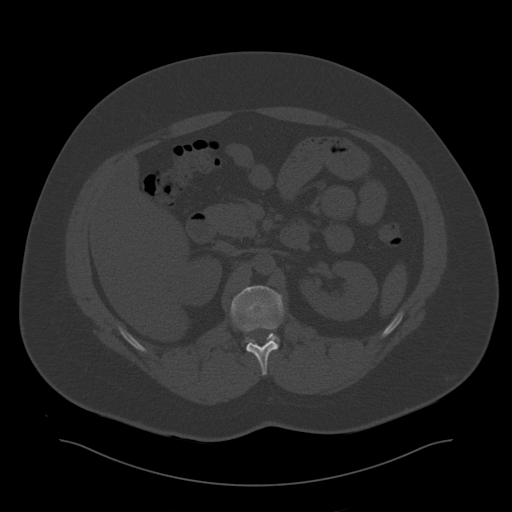
[im 114/154  soft-tissue]
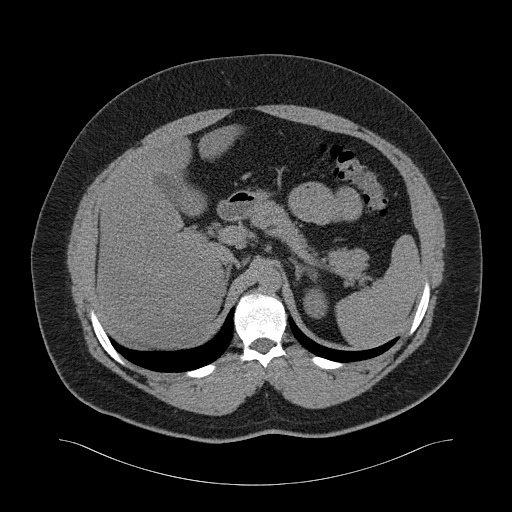
[im 120/154  soft-tissue]
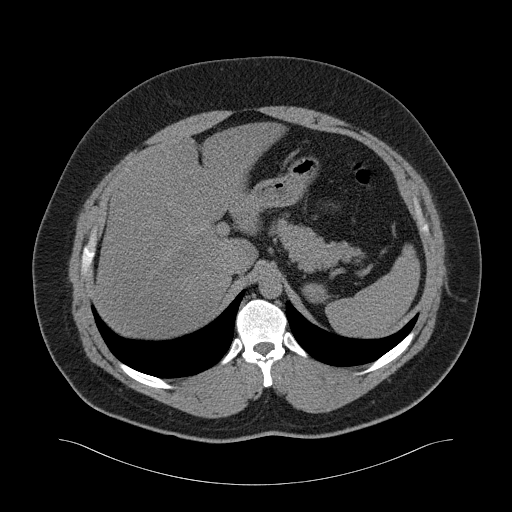
[im 127/154  lung]
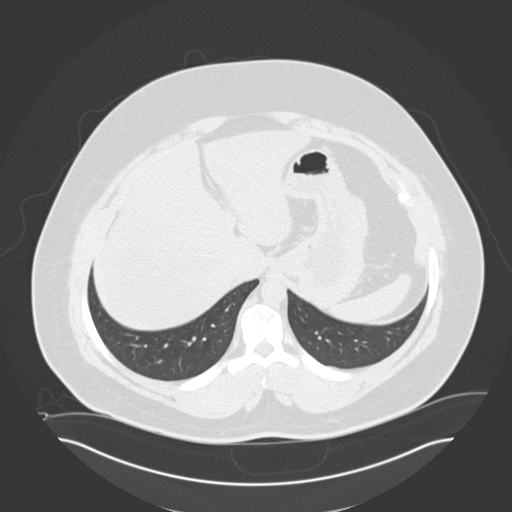
[im 134/154  soft-tissue]
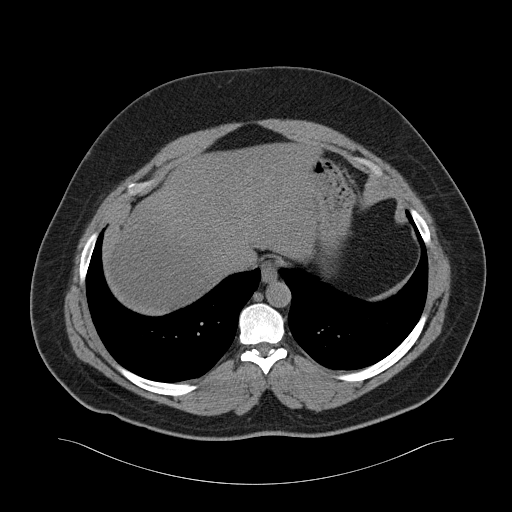
[im 134/154  lung]
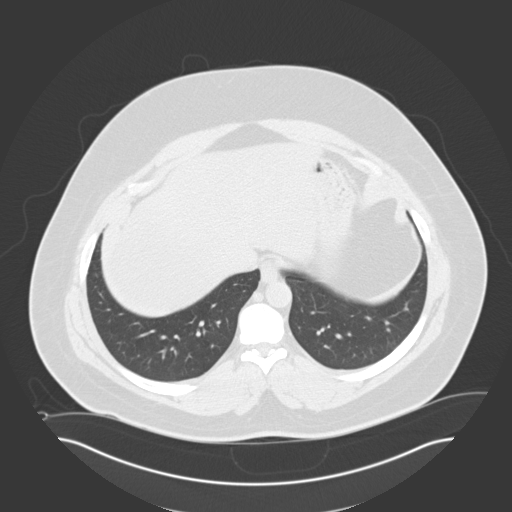
[im 140/154  lung]
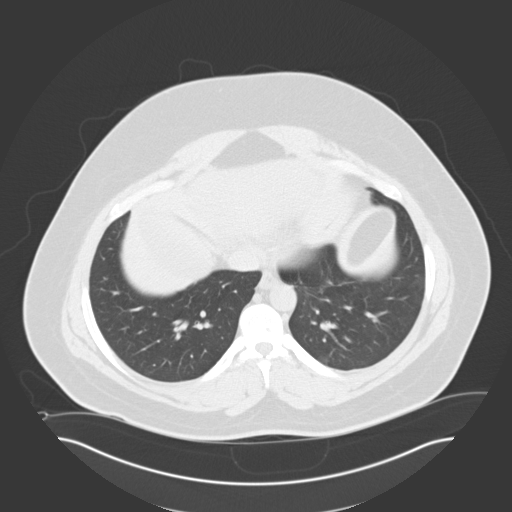
[im 147/154  soft-tissue]
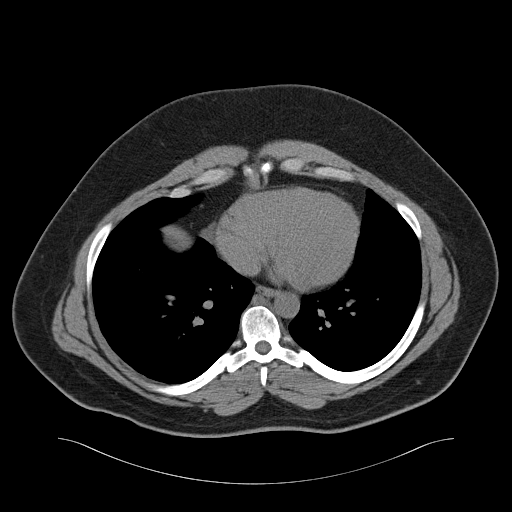
[im 147/154  lung]
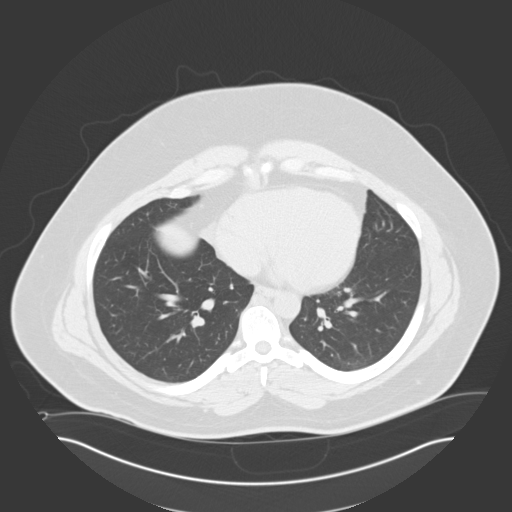

[15 of 32 positions shown; findings below may reference images not displayed]

PROCEDURE:     CT  - CT ABDOMEN /PELVIS WO (STONE)  - October 19, 2012 [DATE]

RESULT:     CT of the abdomen and pelvis without contrast is reconstructed
at 3 mm slice thickness in the axial plane. Comparison is made to a previous
similar study dated 30 January, 2009.

There is an inferior vena cava filter present. There is a stone in the right
kidney midpole region measuring approximately 3 mm diameter. There is
density in the right ureter above the level of the iliac crest on image 87
with a diameter of approximately 3 mm. There is minimal hydronephrosis. No
additional nephrolithiasis is evident. Urinary bladder is nondistended
without stones. The bowel is unremarkable. There is no adenopathy. The
appendix appears normal. No radiopaque gallstones are evident. The liver,
pancreas, spleen, adrenal glands and stomach appear within normal limits.
The lung bases show grossly normal aeration.
IMPRESSION: Minimal right hydronephrosis. Proximal to mid right
ureteral 3 mm stone. Additional small stone in the mid right kidney.
Inferior vena cava filter present.

[REDACTED]

## 2015-01-20 NOTE — Op Note (Signed)
PATIENT NAME:  Ricardo KillianHENSLEY, Delshawn W MR#:  [id number] DATE OF BIRTH:  1981/02/28  DATE OF PROCEDURE:  10/11/2011  PREOPERATIVE DIAGNOSES:  1. Deep venous thrombosis with failure of Coumadin compliance and now recurrent deep venous thrombosis present.  2. Schizophrenia/bipolar/PTSD.   POSTOPERATIVE DIAGNOSES:  1. Deep venous thrombosis with failure of Coumadin compliance and now recurrent deep venous thrombosis present.  2. Schizophrenia/bipolar/PTSD.   PROCEDURES:  1. Ultrasound guidance for vascular access, right femoral vein.  2. Placement of catheter into inferior vena cava.  3. Inferior venacavogram.  4. Placement of [facility] IVC filter.   SURGEON: Annice NeedyJason S. Dew, M.D.   ANESTHESIA: Local with 2 mg Versed.   ESTIMATED BLOOD LOSS: Minimal.  FLUOROSCOPY TIME: One minute.  CONTRAST USED: 15 mL.   INDICATION FOR PROCEDURE: This is a 34 year old white male with history of psychiatric illness who had a deep venous thrombosis last year. He took his Coumadin for a short period of time but did not continue this. It has now been a couple of months since he has taken his Coumadin and he presents with new left leg pain and swelling. This was found to be a new deep venous thrombosis with extension up into the superficial femoral vein where it had just previously been popliteal. Due to his compliance issues with Coumadin and now more extensive deep vein thrombosis we are consulted for IVC filter placement. The internal medicine physicians had already spoken with the hematologist who recommended placement as well.   DESCRIPTION OF PROCEDURE: The patient was brought to the vascular interventional radiology suite, groins were shaved and prepped and a sterile surgical field was created. The right femoral vein was visualized with ultrasound and found to be widely patent. It was then accessed under direct ultrasound guidance without difficulty with a Seldinger needle. A three J-wire was placed. After  skin nick and dilatation, the sheath was placed into the inferior vena cava and inferior venacavogram was performed. This demonstrated a patent vena cava. The level of the renal veins was at approximately at L1 and the filter was deployed at the L2-L3 interspace. The delivery system sheaths were removed, pressure was held, and a sterile dressing was placed. The patient tolerated the procedure well and was taken to the Recovery Room in stable condition. ____________________________ Annice NeedyJason S. Dew, MD jsd:slb D: 10/11/2011 11:49:05 ET T: 10/11/2011 12:12:58 ET JOB#: [id number]  cc: Annice NeedyJason S. Dew, MD, <Dictator> Annice NeedyJASON S DEW MD ELECTRONICALLY SIGNED 10/31/2011 15:46

## 2015-01-20 NOTE — Consult Note (Signed)
Patient admitted with left leg pain and swelling.  He has a history of DVT in October in the left popliteal.  Has behavioral medicine/psych issues and could not take his coumadin.  Admitted with new leg pain and swelling, and found to have much more extensive LLE DVT.  Given his non-compliance with coumadin, and significant DVT an IVC filter is recommended.  Would like to remove this in 6 months or so if the clot becomes chronic.  Would still benefit from anti-coagulation to avoid post-phlebitic issues but don't know that this will occure.  Will place filter today.    Electronic Signatures: Annice Needyew, Lennell Shanks S (MD)  (Signed on 13-Jan-13 10:34)  Authored  Last Updated: 13-Jan-13 10:34 by Annice Needyew, Sriansh Farra S (MD)

## 2015-01-20 NOTE — Consult Note (Signed)
PATIENT NAME:  Ricardo Wood, Panfilo W MR#:  [id number] DATE OF BIRTH:  25-Mar-1981  DATE OF CONSULTATION:  10/11/2011  REFERRING PHYSICIAN:   CONSULTING PHYSICIAN:  Annice NeedyJason S. Andrika Peraza, MD  REASON FOR CONSULTATION: DVT in a patient who has failed compliance with Coumadin.  HISTORY OF PRESENT ILLNESS: This is a 34 year old white male who was admitted to the hospital with a two day history of leg pain and swelling. He has a previous history of DVT approximately three months ago. This was a popliteal DVT. He was discharged on Coumadin which he failed to continue due to his multiple life and psychiatric issues. He has apparent diagnosis of schizophrenia/bipolar disorder and has not been compliant. His repeat duplex performed on admission shows a more extensive DVT than his initial with clot of the superficial femoral vein at this point with new symptoms. This is likely a new DVT in a failure due to his inability to take Coumadin and as such we are consulted for IVC filter. He does not have any chest pain or shortness of breath. He does not have any syncope, palpitations, or symptoms of heart failure   PAST MEDICAL HISTORY:  1. PTSD/schizophrenia/bipolar.  2. Deep vein thrombosis.  3. Attention deficit hyperactivity disorder.   4. Insomnia.  5. History of seizure disorder.   PAST SURGICAL HISTORY:  1. Boils lanced. 2. Ingrown toenail resection.   ALLERGIES: None known.   MEDICATIONS: 1. He is supposed to be on Coumadin but has not taken it since before Christmas. 2. Depakote 1000 mg at bedtime. 3. Klonopin 1 mg t.i.d.    FAMILY HISTORY: No clotting or coagulopathies to his knowledge.   SOCIAL HISTORY: [place] He smokes both tobacco and marijuana.   REVIEW OF SYSTEMS: GENERAL: No fevers or chills. No intentional weight loss or gain. EYES: No blurry or double vision. EARS: No tinnitus or ear pain. CARDIAC: No chest pain or palpitations. RESPIRATORY: No shortness of breath or cough. GI: No  nausea, vomiting, or diarrhea. GU: No dysuria or hematuria. ENDOCRINE: No heat or cold intolerance. HEME: Positive for his DVT. SKIN: No new rash or ulcers. PSYCH: Positive as above. NEUROLOGIC: History of seizure but none in over a year. No stroke or TIA. MUSCULOSKELETAL: Left leg pain and swelling.   PHYSICAL EXAMINATION:   GENERAL: This is a well developed, well nourished white male who is not in apparent distress.   VITAL SIGNS: Temperature 97.8, pulse 65, blood pressure 115/68.   HEAD: Normocephalic and atraumatic other than multiple piercings.   EYES: Sclerae nonicteric. Conjunctivae are clear.   EARS: Normal external appearance. Hearing is intact.   NECK: Neck is supple without adenopathy or jugular venous distention. Carotids have good upstroke. No bruits.   HEART: Regular rate and rhythm.   LUNGS: Clear to auscultation without increased work of breathing.   SKIN: Warm and dry with multiple tattoos.   EXTREMITIES: He has mild to moderate left lower extremity edema and mild tenderness to palpation in the calf in the area. His pulses are 2+ throughout. Duplex is as described above.   ASSESSMENT AND PLAN: This is a 34 year old white male with a failure of compliance with anticoagulation. He has even more extensive DVT at this time and his risk of embolization is higher. I would not expect him to be compliant with Coumadin again, although I did recommend this to avoid postphlebitic pain and swelling. As such, I think an IVC filter is very reasonable at this point. I would  prefer this to be a retrievable IVC filter given his young age and if his clot becomes chronic over the next six months or so this could be removed. The patient voices understanding. Will plan on proceeding with filter placement.   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   ____________________________ Annice Needy, MD jsd:drc D: 10/11/2011 11:01:56 ET T: 10/11/2011 11:24:10 ET JOB#: [id number]  cc: Annice Needy, MD,  <Dictator> Annice Needy MD ELECTRONICALLY SIGNED 10/31/2011 15:45

## 2015-01-20 NOTE — H&P (Signed)
PATIENT NAME:  Ricardo Wood, Jeanluc W MR#:  [id number] DATE OF BIRTH:  [birth year]  DATE OF ADMISSION:  10/11/2011  PRIMARY CARE PHYSICIAN: None.  PSYCHIATRY: [facility]   CHIEF COMPLAINT: Pain in the left calf for two days.   HISTORY OF PRESENT ILLNESS: Mr. Ricardo Wood is a 34 year old Caucasian gentleman with history of schizophrenia and bipolar disorder who comes into the Emergency Room with complaints of left lower extremity pain and swelling for the past two days. The patient has history of left lower extremity nonocclusive popliteal vein DVT diagnosed in October of 2012 and discharged home with Lovenox and Coumadin; however, the patient took about a month of Coumadin and then ran out of his Coumadin and did not go to the MD to get it refilled and has been off Coumadin for the last two months. He denies any recent surgery, any recent trips or any recent illness requiring him to be bedbound for a long time. The patient denies any chest pain or shortness of breath. No syncope or palpitations.   PAST MEDICAL HISTORY:  1. History of seizure disorder per patient but not on medicine. He has not had seizures in more than a year.  2. Bipolar disorder. 3. Schizophrenia.  4. Left lower extremity popliteal vein nonocclusive DVT diagnosed in October of 2012. The patient's hypercoagulable work-up had been negative except for mild positive anticardiolipin IgG antibody. The patient was supposed to be on Coumadin, however, he did not complete his course of medicine.  5. Attention deficit hyperactivity disorder.   6. PTSD.  7. Insomnia.   PAST SURGICAL HISTORY:  1. Incision and drainage of boils on his back.  2. Ingrown toenail resection.   ALLERGIES: No known drug allergies.   MEDICATIONS:  1. Klonopin 1 mg t.i.d.  2. Depakote ER 1000 mg at bedtime.  3. The patient was supposed to be on Coumadin, however, has not taken it for about two months.   FAMILY HISTORY: Heart disease. No blood  disorder or clotting disorders.   SOCIAL HISTORY: Lives in [place]. Smokes about a pack every day. Denies any alcohol use. He uses marijuana.   REVIEW OF SYSTEMS: CONSTITUTIONAL: No fever, fatigue, weakness. EYES: No blurred or double vision. ENT: No tinnitus, ear pain, hearing loss. RESPIRATORY: No cough, wheeze, hemoptysis. CARDIOVASCULAR: No chest pain, orthopnea, edema. GI: No nausea, vomiting, diarrhea, or abdominal pain. GU: No dysuria or hematuria. ENDOCRINE: No polyuria or nocturia. HEMATOLOGY: No anemia or easy bruising. SKIN: No acne or rash. MUSCULOSKELETAL: Positive for left calf pain. NEUROLOGIC: No CVA or TIA. PSYCH: No anxiety or depression. Positive for bipolar schizophrenia. All other systems reviewed and negative.   PHYSICAL EXAMINATION:   GENERAL: The patient is awake, alert, and oriented x3 not in acute distress.   VITAL SIGNS: Afebrile, pulse 84, blood pressure 134/70, sats 97% on room air.   HEENT: Atraumatic, normocephalic. Pupils equal, round, and reactive to light and accommodation. Extraocular movements intact. Oral mucosa is moist.   NECK: Supple. No JVD. No carotid bruit.   RESPIRATORY: Clear to auscultation bilaterally. No rales, rhonchi, respiratory distress, or labored breathing.   CARDIOVASCULAR: Both the heart sounds are normal. Rate, rhythm is regular. PMI not lateralized. Chest nontender.   EXTREMITIES: Left calf tenderness present. There is some swelling present. No pitting edema. Good pedal pulses and femoral pulses bilaterally.   SKIN: Warm and dry.   NEUROLOGIC: Grossly nonfocal. No motor or sensory deficit.    PSYCH: The patient appears alert and oriented  x3. Mood and affect normal.   LABORATORY, DIAGNOSTIC, AND RADIOLOGICAL DATA: PT and INR 12.9 and 0.9. Comprehensive metabolic panel within normal limits. CBC within normal limits except white count of 11.9. Ultrasound of the lower extremity on the left shows interval increase in DVT now with  near occlusive thrombus in the mid and distal left femoral vein as well as occlusive thrombus in the popliteal vein.   ASSESSMENT AND PLAN: 34 year old Ricardo Wood with:  1. 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 The patient had a nonocclusive thrombus in the left popliteal vein in October 2012 and now has occlusive clot in the popliteal vein and new thrombus in the left mid and distal femoral vein. Thrombotic work-up was negative last admission. Will not resend it except anticardiolipin antibody. IgG was positive.  2. Bipolar schizophrenia.   PLAN:  1. Admit patient to medical floor.  2. Regular diet.  3. Will start patient on Lovenox 1 mg/kg b.i.d. and Coumadin.  4. Monitor PT and INR.  5. Will not send thrombotic work-up since it was sent three months ago.  6. Curbsided Hematology/Oncology who recommend indefinite Coumadin for now. I also discussed with Dr. Wyn Quaker given recurrence and extension of DVT in three months with medical noncompliance to medications. The patient is a candidate for IVC filter placement to avoid life-threatening PE and complications from DVT.  7. Bipolar disorder/schizophrenia. Will continue Depakote and Klonopin.  8. Further work-up according to the patient's clinical course.   Hospital admission plan was discussed with the patient. No family members were present in the Emergency Room.   TIME SPENT: 50 minutes   ____________________________ Jearl Klinefelter A. Allena Katz, MD sap:drc D: 10/11/2011 09:04:00 ET T: 10/11/2011 09:47:08 ET JOB#: [id number]  cc: Eimi Viney A. Allena Katz, MD, <Dictator> Willow Ora MD ELECTRONICALLY SIGNED 10/13/2011 15:24

## 2015-01-20 NOTE — Discharge Summary (Signed)
PATIENT NAME:  Ricardo Wood, [name] MR#:  [id number] DATE OF BIRTH:  1981-02-01  DATE OF ADMISSION:  10/11/2011 DATE OF DISCHARGE:  10/12/2011   PRIMARY CARE PHYSICIAN: Evelene CroonMeindert Niemeyer, MD   PRIMARY PSYCHIATRIST: [facility] .  REASON FOR ADMISSION: Left calf pain.   DISCHARGE DIAGNOSES:  1. 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 The patient is also status post IVC filter insertion.  2. Leukocytosis, felt to be stress induced.  3. History of left lower extremity popliteal vein nonocclusive deep venous thrombosis diagnosed October 2012.  4. History of bipolar disorder.  5. History of schizophrenia. 6. History of attention deficit hyperactivity disorder.  7. History of anxiety.  8. History of seizure disorder.  9. Tobacco abuse, ongoing.   DISCHARGE MEDICATIONS:  1. Lovenox 1 mg/kg (115 mg) subcutaneously q.12 hours until INR is therapeutic.  2. Coumadin 7.5 mg p.o. daily.  3. Depakote 1000 mg p.o. at bedtime. 4. Klonopin 1 mg p.o. t.i.d. p.r.n. anxiety.  5. Tylenol 325 mg 1 to 2 tablets p.o. q.4 to 6 hours p.r.n. pain.   DISCHARGE DISPOSITION: Home.   DISCHARGE CONDITION: Improved, stable.   DISCHARGE ACTIVITY: As tolerated.   DISCHARGE DIET: Regular.   DISCHARGE INSTRUCTIONS:  1. Take medications as prescribed. 2. Return to the Emergency Department for recurrence of symptoms.   FOLLOW-UP INSTRUCTIONS:  1. Follow-up with Dr. Lacie ScottsNiemeyer within one week. The patient needs repeat PT/INR in the next 2 to 3 days and primary MD to please instruct the patient to stop taking Lovenox once his INR is therapeutic. The patient also needs repeat CBC in one week to reassess his WBC count.  2. Follow-up with your psychiatrist as previously advised.   CONSULTS DURING THIS HOSPITALIZATION: Vascular Surgery, Dr. Wyn Quakerew    PROCEDURES:  1. IVC filter insertion by Dr. Wyn Quakerew 10/11/2011.  2. Left  lower extremity venous Doppler ultrasound 10/11/2011 interval increase in deep venous thrombosis now with new occlusive thrombus in the mid and distal left femoral vein as well as occlusive thrombus in the popliteal vein.   PERTINENT LABORATORY DATA: CMP normal on admission. CBC on admission WBC 11.9, hemoglobin 14.4, hematocrit 42.9, platelets 136. INR 0.9 on admission.   BRIEF HISTORY/HOSPITAL COURSE: The patient is a 34 year old male with past medical history of left lower extremity DVT, bipolar disorder, schizophrenia, attention deficit hyperactivity disorder, anxiety, seizure disorder, and tobacco abuse who presented to the Emergency Department with complaints of left calf pain. Please see dictated admission history and physical for pertinent details surrounding the onset of this hospitalization. Please see below for further details.  1. Recurrent left lower extremity DVT due to medication noncompliance as the patient was not taking Coumadin at the time of admission. Doppler ultrasound of the left lower extremity confirmed DVT. He was restarted on anticoagulation initially with Lovenox. Shortly thereafter Coumadin was reinitiated. Vascular Surgery consultation was obtained and Dr. Wyn Quakerew recommended IVC filter insertion. The patient underwent IVC filter insertion on date mentioned above without any complications. The patient has had a hypercoagulable work-up in the past which was negative except for mildly positive anticardiolipin IgG antibody. The patient was supposed to have been on Coumadin at the time of admission, however, he was not taking his medication as advised. Hematology/Oncology was curb-sided in the ER and recommendation was made to continue indefinite Coumadin therapy. The patient was already aware on how to self-administer Lovenox injections. Therefore, the patient will be discharged home  on Lovenox as a bridge to Coumadin and his PT/INR will need to be closely followed by his primary care  physician. The patient will need to stop taking Lovenox once his INR is therapeutic with goal INR between 2 to 3. The patient was strongly advised to remain compliant with his medications.   2. Leukocytosis, felt to be stress induced. The patient was afebrile and no obvious signs of infection. Recommend repeat WBC count per the patient's primary care physician within one week.  3. Bipolar disorder/schizophrenia/attention deficit hyperactivity disorder. The patient is not on any medications for these conditions at baseline and he will continue to follow-up with his psychiatrist upon hospital discharge. He denied any depression, suicidal or homicidal ideation when I asked him. 4. Anxiety. The patient to continue p.r.n. Klonopin. 5. History of seizure disorder. The patient states he has not experienced any seizures over the past year and he is not on any maintenance antiepileptics at baseline. He takes p.r.n. Klonopin for anxiety which should also help prevent seizures. 6. Tobacco abuse. The patient was strongly counseled on the importance of smoking cessation.   On 10/12/2011 the patient was hemodynamically stable and he was without any calf pain and he was felt to be stable for discharge home with close outpatient follow-up to which the patient was agreeable.   TIME SPENT ON DISCHARGE: Greater than 30 minutes.   ____________________________ Elon Alas, MD knl:drc D: 10/15/2011 19:11:02 ET T: 10/16/2011 09:22:47 ET JOB#: [id number]  cc: Elon Alas, MD, <Dictator> Meindert A. Lacie Scotts, MD Elon Alas MD ELECTRONICALLY SIGNED 10/27/2011 18:42

## 2015-04-05 ENCOUNTER — Encounter: Payer: Self-pay | Admitting: Emergency Medicine

## 2015-04-05 ENCOUNTER — Emergency Department
Admission: EM | Admit: 2015-04-05 | Discharge: 2015-04-05 | Disposition: A | Discharge status: Home / Self Care | Payer: Medicare Other | Attending: Emergency Medicine | Admitting: Emergency Medicine

## 2015-04-05 ENCOUNTER — Emergency Department: Payer: Medicare Other

## 2015-04-05 DIAGNOSIS — N200 Calculus of kidney: Secondary | ICD-10-CM | POA: Insufficient documentation

## 2015-04-05 DIAGNOSIS — IMO0002 Reserved for concepts with insufficient information to code with codable children: Secondary | ICD-10-CM

## 2015-04-05 DIAGNOSIS — Z87891 Personal history of nicotine dependence: Secondary | ICD-10-CM | POA: Insufficient documentation

## 2015-04-05 DIAGNOSIS — N508 Other specified disorders of male genital organs: Secondary | ICD-10-CM | POA: Diagnosis present

## 2015-04-05 HISTORY — DX: Acute embolism and thrombosis of unspecified deep veins of unspecified lower extremity: I82.409

## 2015-04-05 HISTORY — DX: Unspecified convulsions: R56.9

## 2015-04-05 LAB — URINALYSIS COMPLETE WITH MICROSCOPIC (ARMC ONLY)
BACTERIA UA: NONE SEEN
Bilirubin Urine: NEGATIVE
Glucose, UA: NEGATIVE mg/dL
Ketones, ur: NEGATIVE mg/dL
Leukocytes, UA: NEGATIVE
Nitrite: NEGATIVE
Protein, ur: NEGATIVE mg/dL
Specific Gravity, Urine: 1.026 (ref 1.005–1.030)
pH: 5 (ref 5.0–8.0)

## 2015-04-05 LAB — CBC WITH DIFFERENTIAL/PLATELET
Basophils Absolute: 0.1 10*3/uL (ref 0–0.1)
Basophils Relative: 1 %
EOS ABS: 0.2 10*3/uL (ref 0–0.7)
Eosinophils Relative: 1 %
HEMATOCRIT: 46 % (ref 40.0–52.0)
Hemoglobin: 15.1 g/dL (ref 13.0–18.0)
Lymphocytes Relative: 32 %
Lymphs Abs: 4.7 10*3/uL — ABNORMAL HIGH (ref 1.0–3.6)
MCH: 30.6 pg (ref 26.0–34.0)
MCHC: 32.8 g/dL (ref 32.0–36.0)
MCV: 93.3 fL (ref 80.0–100.0)
Monocytes Absolute: 0.7 10*3/uL (ref 0.2–1.0)
Monocytes Relative: 5 %
Neutro Abs: 9.2 10*3/uL — ABNORMAL HIGH (ref 1.4–6.5)
Neutrophils Relative %: 61 %
Platelets: 208 10*3/uL (ref 150–440)
RBC: 4.93 MIL/uL (ref 4.40–5.90)
RDW: 13.9 % (ref 11.5–14.5)
WBC: 15 10*3/uL — ABNORMAL HIGH (ref 3.8–10.6)

## 2015-04-05 LAB — COMPREHENSIVE METABOLIC PANEL
ALT: 66 U/L — ABNORMAL HIGH (ref 17–63)
AST: 48 U/L — AB (ref 15–41)
Albumin: 4.3 g/dL (ref 3.5–5.0)
Alkaline Phosphatase: 84 U/L (ref 38–126)
Anion gap: 10 (ref 5–15)
BILIRUBIN TOTAL: 0.6 mg/dL (ref 0.3–1.2)
BUN: 15 mg/dL (ref 6–20)
CO2: 25 mmol/L (ref 22–32)
CREATININE: 1.29 mg/dL — AB (ref 0.61–1.24)
Calcium: 8.9 mg/dL (ref 8.9–10.3)
Chloride: 102 mmol/L (ref 101–111)
GFR calc Af Amer: >60 mL/min (ref >60)
GFR calc non Af Amer: >60 mL/min (ref >60)
Glucose, Bld: 165 mg/dL — ABNORMAL HIGH (ref 65–99)
POTASSIUM: 4.2 mmol/L (ref 3.5–5.1)
Sodium: 137 mmol/L (ref 135–145)
Total Protein: 7.7 g/dL (ref 6.5–8.1)

## 2015-04-05 LAB — LIPASE, BLOOD: Lipase: 44 U/L (ref 22–51)

## 2015-04-05 LAB — TROPONIN I

## 2015-04-05 MED ORDER — HYDROMORPHONE HCL 1 MG/ML IJ SOLN
INTRAMUSCULAR | Status: AC
  Administered 2015-04-05: 1 mg via INTRAVENOUS
  Filled 2015-04-05: qty 1

## 2015-04-05 MED ORDER — IBUPROFEN 800 MG PO TABS: 800.0000 mg | ORAL_TABLET | Freq: Three times a day (TID) | ORAL | Status: DC | PRN

## 2015-04-05 MED ORDER — PROMETHAZINE HCL 25 MG PO TABS: 25.0000 mg | ORAL_TABLET | Freq: Four times a day (QID) | ORAL | Status: DC | PRN

## 2015-04-05 MED ORDER — HYDROMORPHONE HCL 1 MG/ML IJ SOLN
1.0000 mg | Freq: Once | INTRAMUSCULAR | Status: AC
  Administered 2015-04-05: 1 mg via INTRAVENOUS

## 2015-04-05 MED ORDER — OXYCODONE-ACETAMINOPHEN 5-325 MG PO TABS: 1.0000 | ORAL_TABLET | ORAL | Status: DC | PRN

## 2015-04-05 MED ORDER — PROMETHAZINE HCL 25 MG/ML IJ SOLN
INTRAMUSCULAR | Status: AC
  Administered 2015-04-05: 12.5 mg via INTRAVENOUS
  Filled 2015-04-05: qty 1

## 2015-04-05 MED ORDER — PROMETHAZINE HCL 25 MG/ML IJ SOLN
12.5000 mg | Freq: Once | INTRAMUSCULAR | Status: AC
  Administered 2015-04-05: 12.5 mg via INTRAVENOUS

## 2015-04-05 NOTE — Discharge Instructions (Signed)

## 2015-04-05 NOTE — ED Notes (Signed)
Pt ambulatory to triage with c/o right groin pain and lower abdominal pain. Pt also reports pain to right testicle x4-5 days. Pt reports he was doing yard work earlier today, loss of bowel control. Pt reports nausea and dizziness.

## 2015-04-05 NOTE — ED Provider Notes (Signed)
[facility] Emergency Department Provider Note  ____________________________________________  Time seen: Approximately 7:32 PM  I have reviewed the triage vital signs and the nursing notes.   HISTORY  Chief Complaint Groin Pain; Abdominal Pain; and Testicle Pain    HPI Ricardo Wood is a 34 y.o. male with a past medical history of renal calculi presents with lower abdominal testicular pain. Patient states that the pain started approximately 4-5 days ago. Positive nausea without vomiting. Pain is a 10 over 10.   Past Medical History  Diagnosis Date  . Seizures   . DVT (deep venous thrombosis)     There are no active problems to display for this patient.   Past Surgical History  Procedure Laterality Date  . Dvt filter      Current Outpatient Rx  Name  Route  Sig  Dispense  Refill  . ibuprofen (ADVIL,MOTRIN) 800 MG tablet   Oral   Take 1 tablet (800 mg total) by mouth every 8 (eight) hours as needed.   30 tablet   0   . oxyCODONE-acetaminophen (ROXICET) 5-325 MG per tablet   Oral   Take 1-2 tablets by mouth every 4 (four) hours as needed for severe pain.   15 tablet   0   . promethazine (PHENERGAN) 25 MG tablet   Oral   Take 1 tablet (25 mg total) by mouth every 6 (six) hours as needed for nausea or vomiting.   12 tablet   0     Allergies Review of patient's allergies indicates no known allergies.  No family history on file.  Social History History  Substance Use Topics  . Smoking status: Former Games developermoker  . Smokeless tobacco: Not on file  . Alcohol Use: No    Review of Systems Constitutional: No fever/chills Eyes: No visual changes. ENT: No sore throat. Cardiovascular: Denies chest pain. Respiratory: Denies shortness of breath. Gastrointestinal: Positive abdominal pain.  Positive nausea, no vomiting.  No diarrhea.  No constipation. Genitourinary: Negative for dysuria. As a for testicular pain. Musculoskeletal: Negative  for back pain. Mild right flank pain. Skin: Negative for rash. Neurological: Negative for headaches, focal weakness or numbness.  10-point ROS otherwise negative.  ____________________________________________   PHYSICAL EXAM:  VITAL SIGNS: ED Triage Vitals  Enc Vitals Group     BP 04/05/15 1607 145/89 mmHg     Pulse Rate 04/05/15 1607 112     Resp 04/05/15 1607 19     Temp 04/05/15 1607 97.7 F (36.5 C)     Temp Source 04/05/15 1607 Oral     SpO2 04/05/15 1607 99 %     Weight 04/05/15 1607 315 lb (142.883 kg)     Height 04/05/15 1607 6' (1.829 m)     Head Cir --      Peak Flow --      Pain Score 04/05/15 1608 4     Pain Loc --      Pain Edu? --      Excl. in GC? --     Constitutional: Alert and oriented. Well appearing and in no acute distress. Eyes: Conjunctivae are normal. PERRL. EOMI. Head: Atraumatic. Nose: No congestion/rhinnorhea. Mouth/Throat: Mucous membranes are moist.  Oropharynx non-erythematous. Neck: No stridor.   Cardiovascular: Normal rate, regular rhythm. Grossly normal heart sounds.  Good peripheral circulation. Respiratory: Normal respiratory effort.  No retractions. Lungs CTAB. Gastrointestinal: Soft and nontender. No distention. No abdominal bruits. No CVA tenderness. Genitourinary: Negative dysuria positive testicular scrotal pain. No  lumps or masses noted upon exam. Musculoskeletal: No lower extremity tenderness nor edema.  No joint effusions. Neurologic:  Normal speech and language. No gross focal neurologic deficits are appreciated. Speech is normal. No gait instability. Skin:  Skin is warm, dry and intact. No rash noted. Psychiatric: Mood and affect are normal. Speech and behavior are normal.  ____________________________________________   LABS (all labs ordered are listed, but only abnormal results are displayed)  Labs Reviewed  CBC WITH DIFFERENTIAL/PLATELET - Abnormal; Notable for the following:    WBC 15.0 (*)    Neutro Abs 9.2 (*)     Lymphs Abs 4.7 (*)    All other components within normal limits  COMPREHENSIVE METABOLIC PANEL - Abnormal; Notable for the following:    Glucose, Bld 165 (*)    Creatinine, Ser 1.29 (*)    AST 48 (*)    ALT 66 (*)    All other components within normal limits  URINALYSIS COMPLETEWITH MICROSCOPIC ([facility] ONLY) - Abnormal; Notable for the following:    Color, Urine YELLOW (*)    APPearance CLEAR (*)    Hgb urine dipstick 3+ (*)    Squamous Epithelial / LPF 0-5 (*)    All other components within normal limits  LIPASE, BLOOD  TROPONIN I   ____________________________________________  EKG  Deferred ____________________________________________  RADIOLOGY  Scrotal ultrasound negative interpreted by radiologist. Renal CT positive for renal calculi 5 mm right side. ____________________________________________   PROCEDURES  Procedure(s) performed: None  Critical Care performed: No  ____________________________________________   INITIAL IMPRESSION / ASSESSMENT AND PLAN / ED COURSE  Pertinent labs & imaging results that were available during my care of the patient were reviewed by me and considered in my medical decision making (see chart for details).  Patient given Dilaudid and Phenergan while in the ER. Rx given for Motrin and Percocet upon discharge. Urine strainer given. Instructions given and urine straining and to follow up with urology as directed. Patient voices no other emergency medical complaints at this time and will return to the ER with any worsening of symptoms. ____________________________________________   FINAL CLINICAL IMPRESSION(S) / ED DIAGNOSES  Final diagnoses:  Renal calculi      Evangeline Dakin, PA-C 04/05/15 2039  Arnaldo Natal, MD 04/05/15 2350

## 2016-06-28 IMAGING — US US ART/VEN ABD/PELV/SCROTUM DOPPLER LTD
1 series · 14 of 25 positions shown · non-contrast
Comparison: CT 10/19/2012.

CLINICAL DATA: Right-sided testicular pain for 4 days.

EXAM:
SCROTAL ULTRASOUND
DOPPLER ULTRASOUND OF THE TESTICLES
TECHNIQUE: Complete ultrasound examination of the testicles, epididymis, and
other scrotal structures was performed. Color and spectral Doppler
ultrasound were also utilized to evaluate blood flow to the
testicles.

[Series 1: us art/ven abd/pelv/scrotum doppler ltd · 0.07mm/px · 63 acquisitions, 14 frames shown]
[im 1/63]
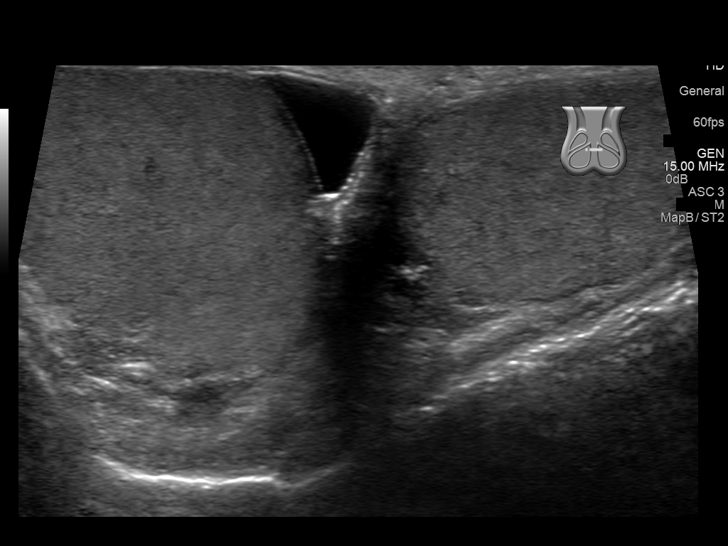
[im 6/63]
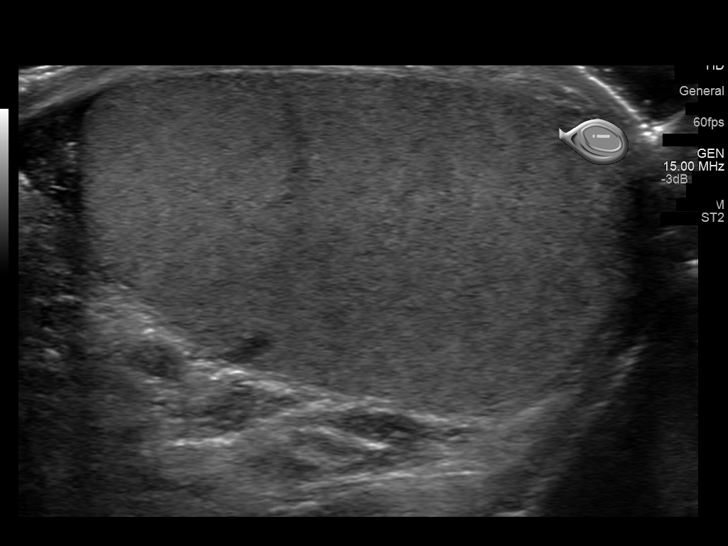
[im 11/63]
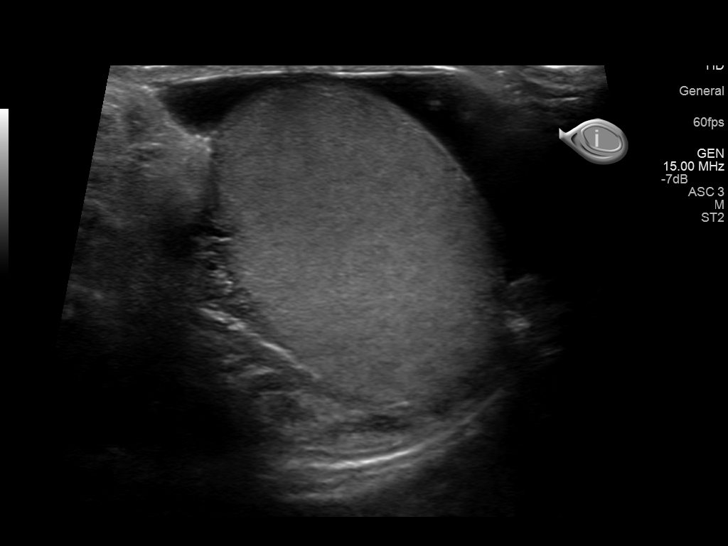
[im 16/63]
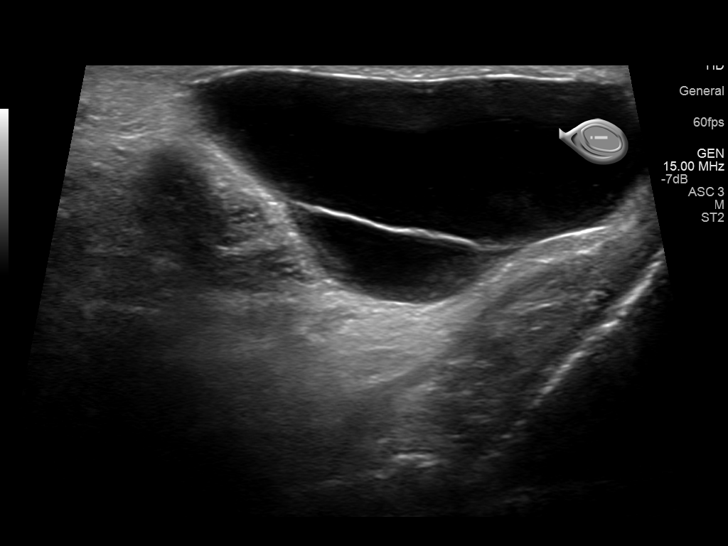
[im 21/63]
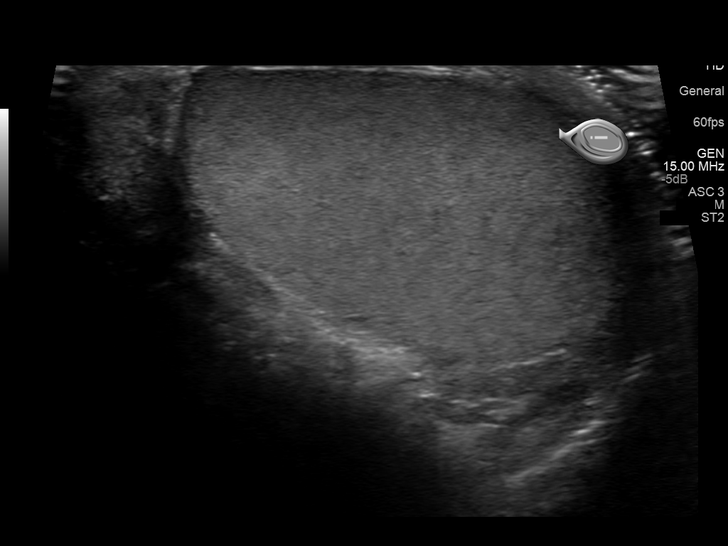
[im 24/63]
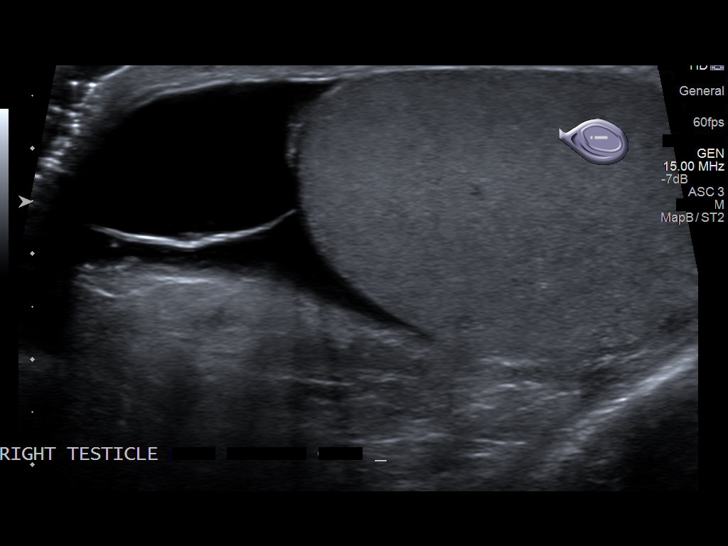
[im 29/63]
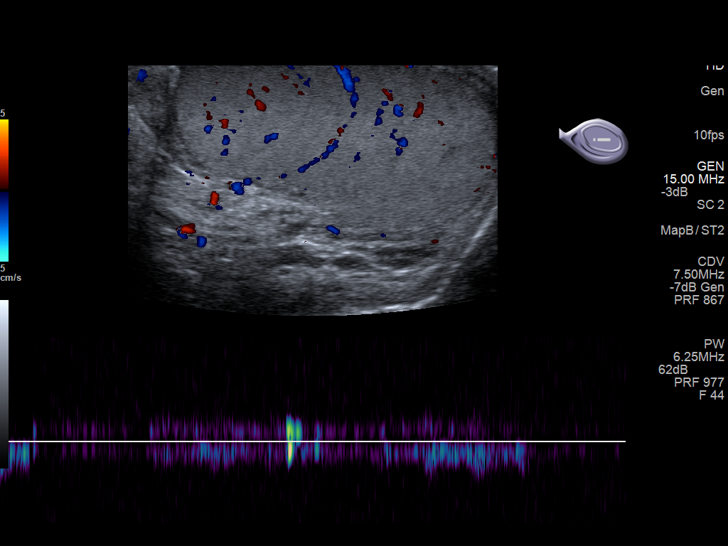
[im 34/63]
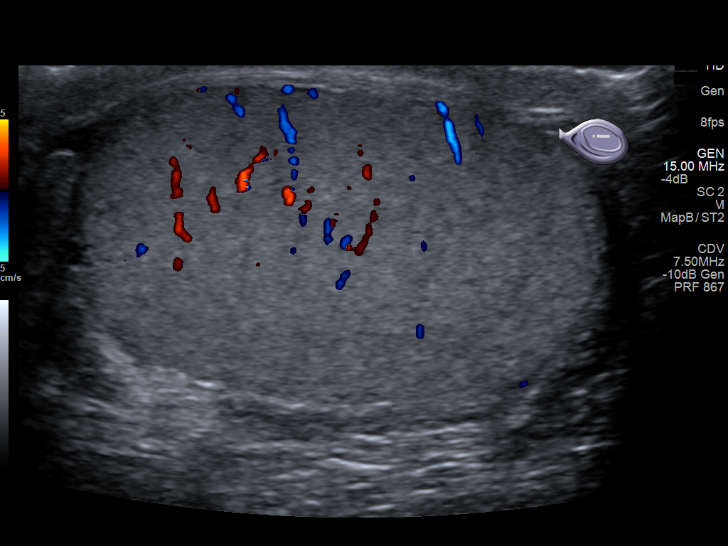
[im 39/63]
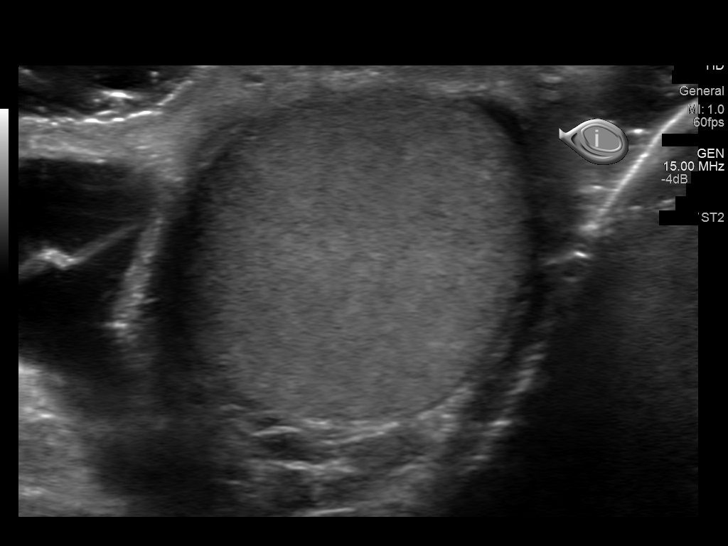
[im 42/63]
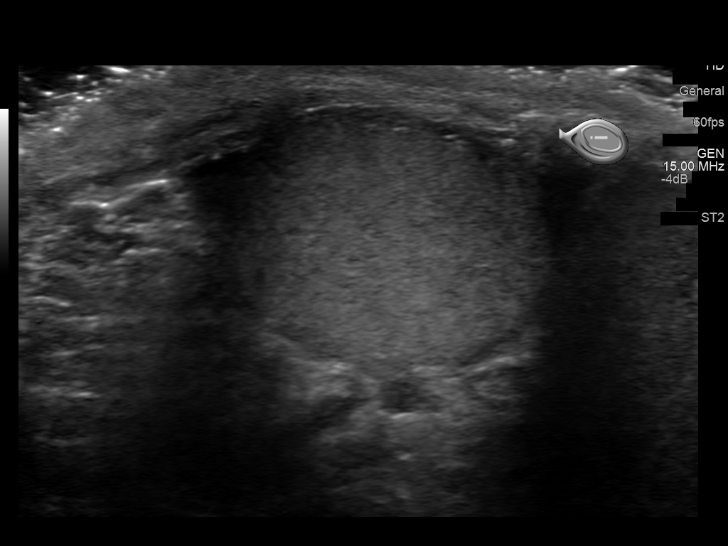
[im 47/63]
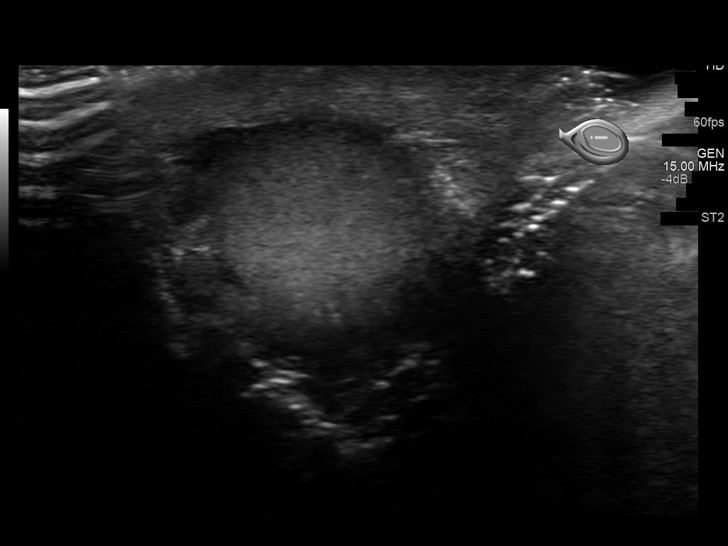
[im 52/63]
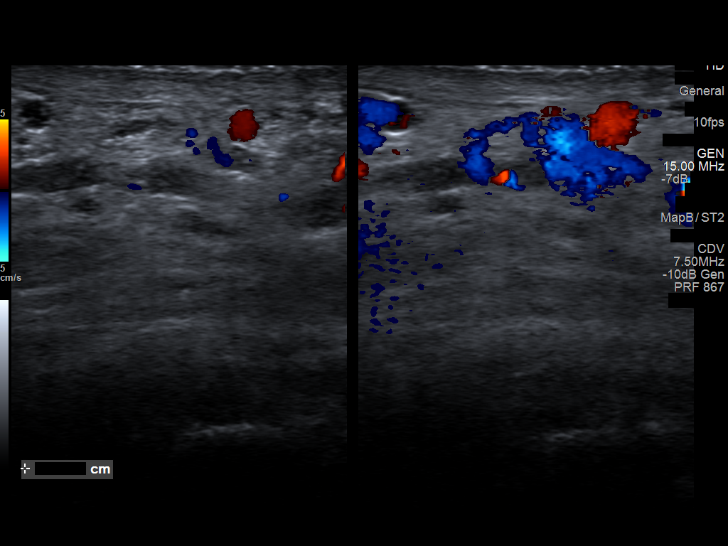
[im 57/63]
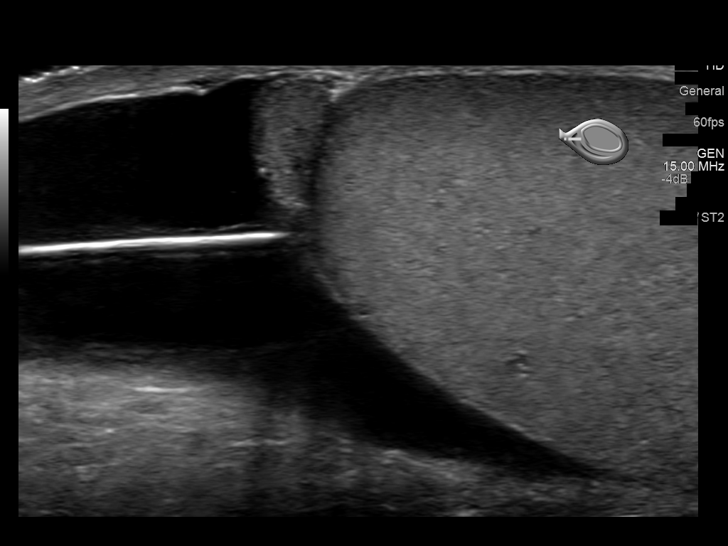
[im 63/63]
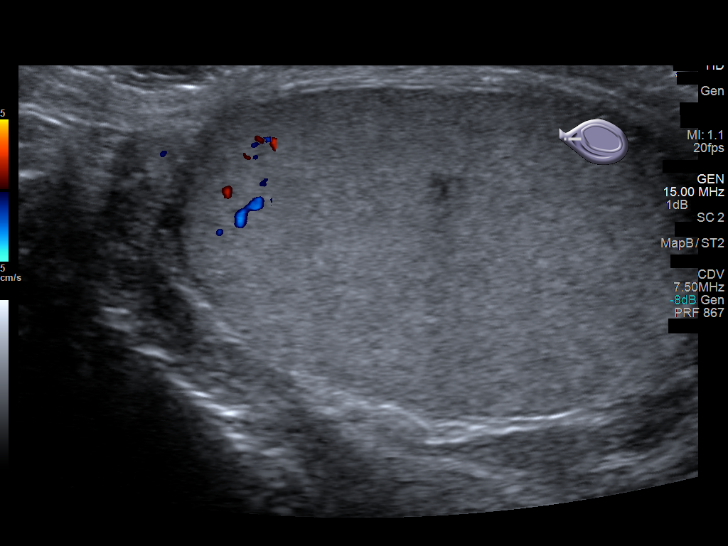

[14 of 25 positions shown; findings below may reference images not displayed]

FINDINGS: Right testicle

Measurements: 4.8 x 2.7 x 3.5 cm. Normal in morphology. Normal color
Doppler signal.

Left testicle

Measurements: 4.5 x 2.6 x 3.1 cm. Normal in morphology. Normal color
Doppler signal.

Right epididymis:  Normal in size and appearance.

Left epididymis:  Normal in size and appearance.

Hydrocele:  Mild volume , mildly complex right hydrocele.

Varicocele:  Absent

Pulsed Doppler interrogation of both testes demonstrates normal low
resistance arterial and venous waveforms bilaterally.
IMPRESSION: 1. No evidence of testicular torsion or epididymitis/orchitis.
2. A right-sided hydrocele demonstrates mild complexity,
nonspecific.

## 2017-01-11 IMAGING — CT CT RENAL STONE PROTOCOL
1 of 2 series · 15 of 32 positions shown, 19 images · non-contrast
Comparison: 10/19/2012

CLINICAL DATA: Right lower quadrant pain and right groin pain for 5
days. Nausea. Dizziness. Hematuria.

EXAM:
CT ABDOMEN AND PELVIS WITHOUT CONTRAST
TECHNIQUE: Multidetector CT imaging of the abdomen and pelvis was performed
following the standard protocol without IV contrast.

[Series 2: stone standard full · axial · 0.98mm/px · z∈[-626,-126]mm · 15 of 110 slices shown, 19 images]
[im 5/110  soft-tissue]
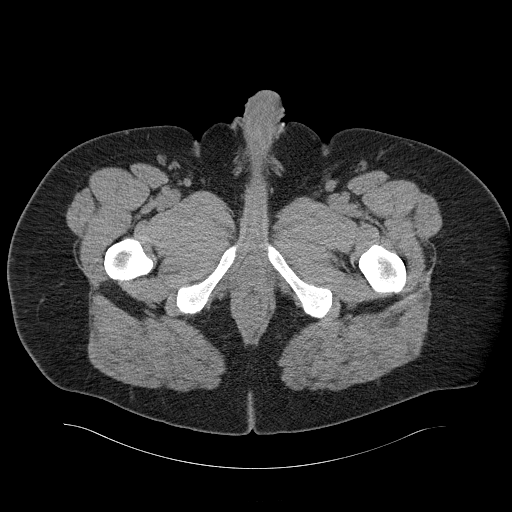
[im 5/110  bone]
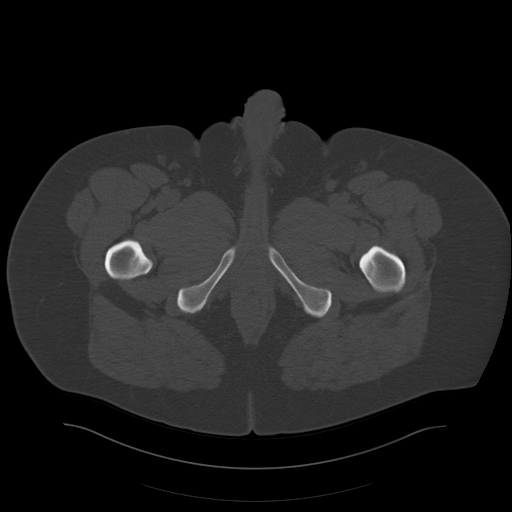
[im 14/110  soft-tissue]
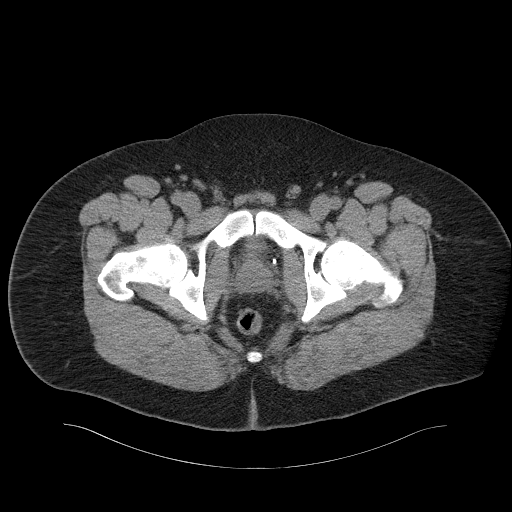
[im 22/110  soft-tissue]
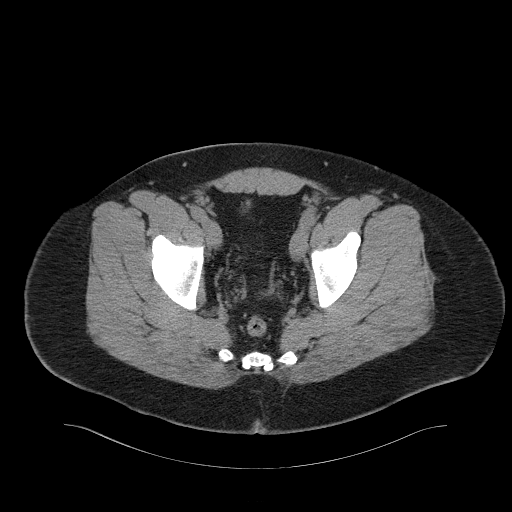
[im 31/110  soft-tissue]
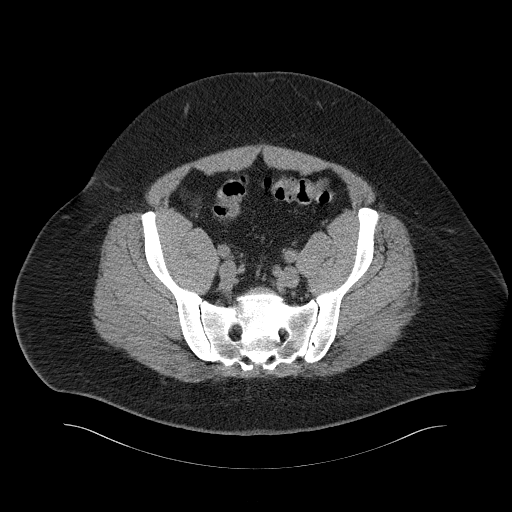
[im 40/110  soft-tissue]
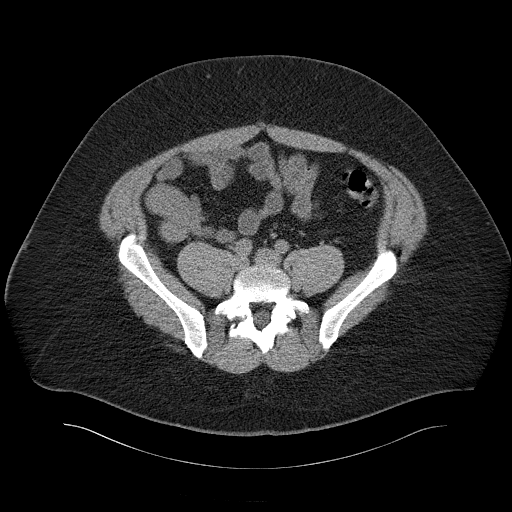
[im 48/110  soft-tissue]
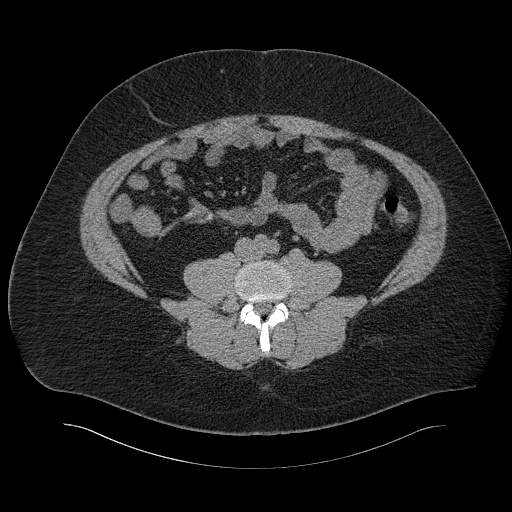
[im 57/110  soft-tissue]
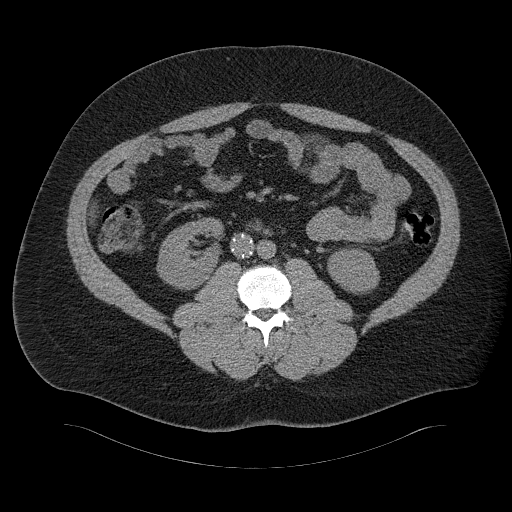
[im 62/110  soft-tissue]
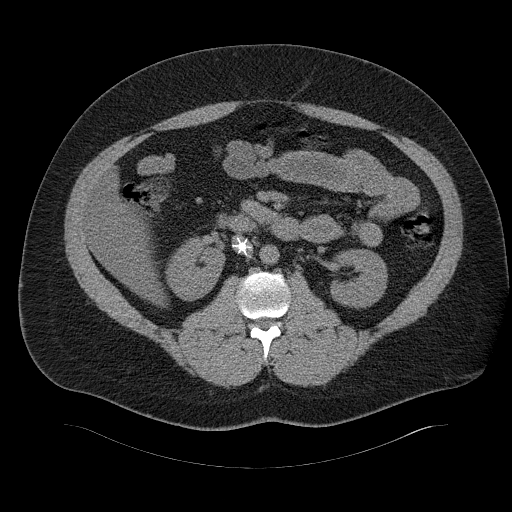
[im 70/110  soft-tissue]
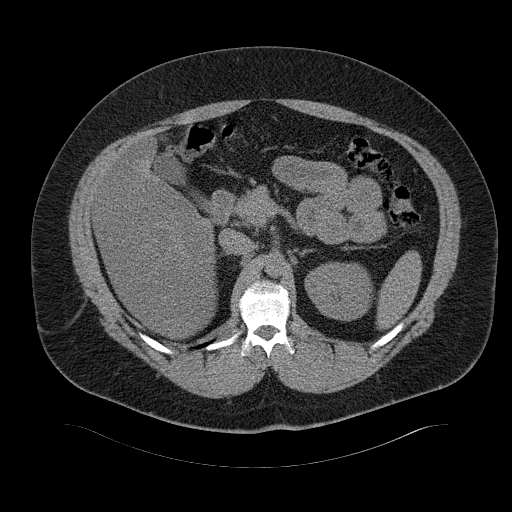
[im 70/110  bone]
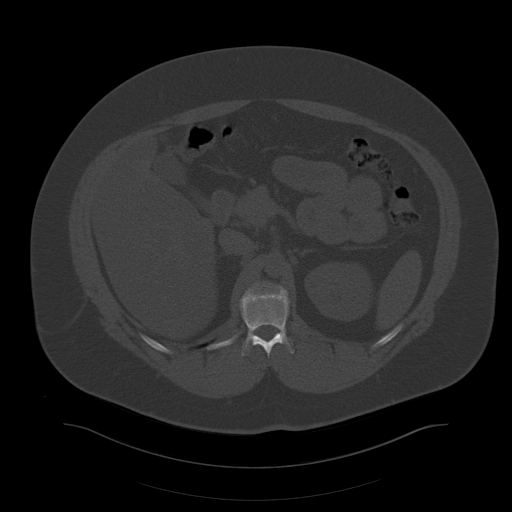
[im 79/110  soft-tissue]
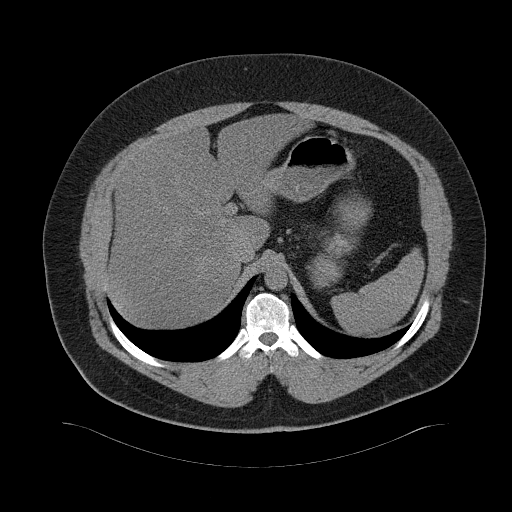
[im 88/110  soft-tissue]
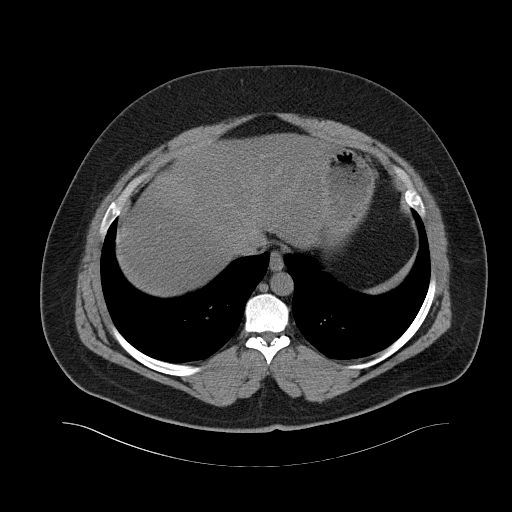
[im 92/110  lung]
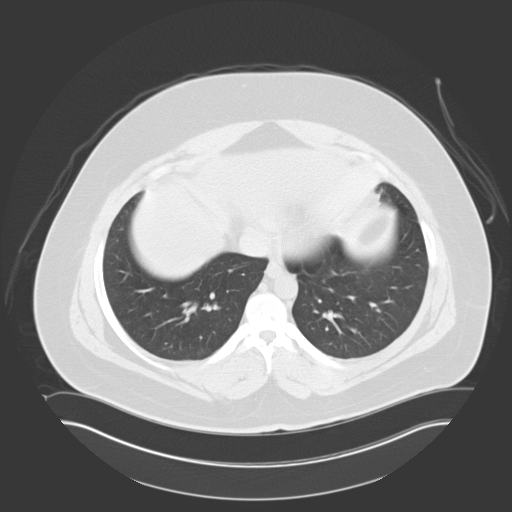
[im 96/110  soft-tissue]
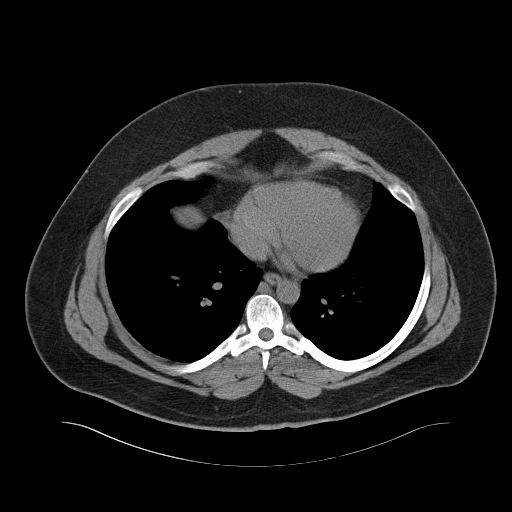
[im 96/110  lung]
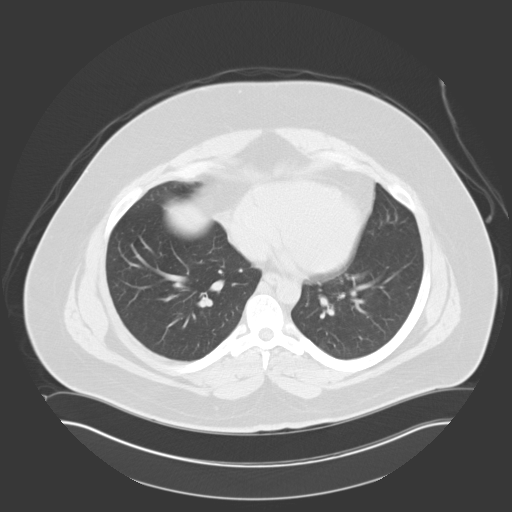
[im 101/110  lung]
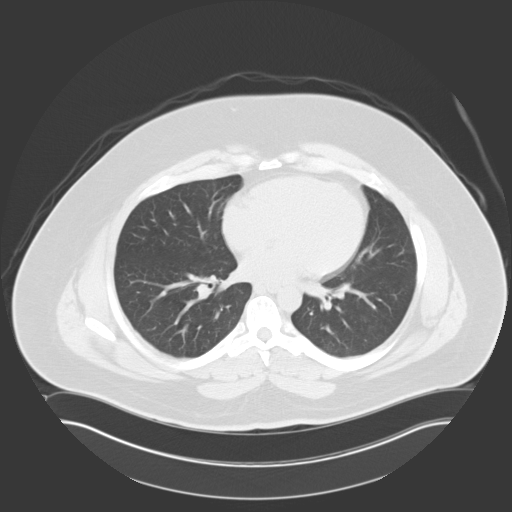
[im 105/110  soft-tissue]
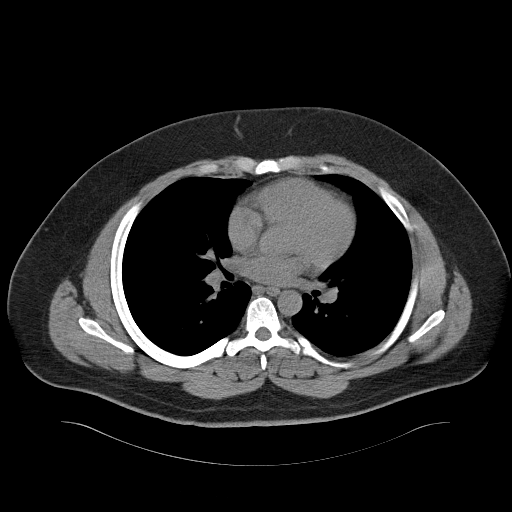
[im 105/110  lung]
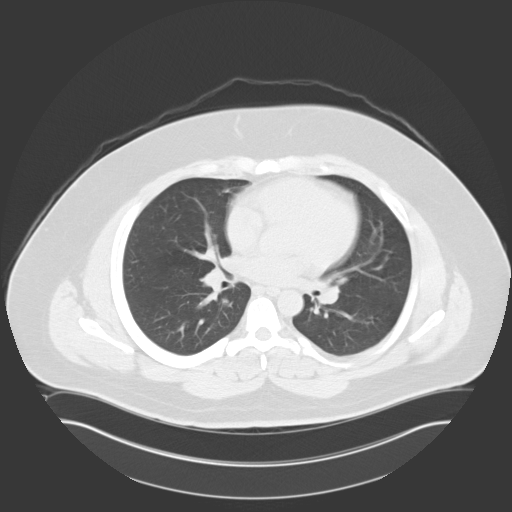

[15 of 32 positions shown; findings below may reference images not displayed]

FINDINGS: Lower chest: No acute findings.

Hepatobiliary: No mass visualized on this unenhanced exam. Diffuse
hepatic steatosis Again noted. Gallbladder is unremarkable.

Pancreas: No mass or inflammatory process visualized on this
unenhanced exam.

Spleen:  Within normal limits in size.

Adrenal Glands:  No masses identified.

Kidneys/Urinary tract: Tiny nonobstructive calculi noted in the mid
lower pole the right kidney, largest measuring 3 mm. No evidence
hydronephrosis. No evidence of ureteral calculi or dilatation. No
bladder calculi identified.

Stomach/Bowel/Peritoneum: Unremarkable. Normal appendix visualized.

Vascular/Lymphatic: No pathologically enlarged lymph nodes
identified. No abdominal aortic aneurysm or other significant
retroperitoneal abnormality demonstrated. IVC filter seen in
appropriate position.

Reproductive:  No mass or other significant abnormality noted.

Other:  None.

Musculoskeletal: No suspicious bone lesions identified. Bilateral L5
pars defects incidentally noted, without evidence of associated
spondylolisthesis.
IMPRESSION: Tiny nonobstructive right renal calculi. No evidence of ureteral
calculi, hydronephrosis, or other acute findings.

Diffuse hepatic steatosis.

## 2018-06-14 ENCOUNTER — Encounter: Payer: Self-pay | Admitting: Emergency Medicine

## 2018-06-14 ENCOUNTER — Emergency Department
Admission: EM | Admit: 2018-06-14 | Discharge: 2018-06-14 | Disposition: A | Discharge status: Home / Self Care | Payer: No Typology Code available for payment source | Attending: Emergency Medicine | Admitting: Emergency Medicine

## 2018-06-14 DIAGNOSIS — M7918 Myalgia, other site: Secondary | ICD-10-CM | POA: Insufficient documentation

## 2018-06-14 DIAGNOSIS — R600 Localized edema: Secondary | ICD-10-CM | POA: Insufficient documentation

## 2018-06-14 DIAGNOSIS — M545 Low back pain: Secondary | ICD-10-CM | POA: Insufficient documentation

## 2018-06-14 DIAGNOSIS — Z87891 Personal history of nicotine dependence: Secondary | ICD-10-CM | POA: Diagnosis not present

## 2018-06-14 DIAGNOSIS — Z79899 Other long term (current) drug therapy: Secondary | ICD-10-CM | POA: Insufficient documentation

## 2018-06-14 MED ORDER — ORPHENADRINE CITRATE ER 100 MG PO TB12: 100.0000 mg | ORAL_TABLET | Freq: Two times a day (BID) | ORAL | 0 refills | Status: DC

## 2018-06-14 MED ORDER — CYCLOBENZAPRINE HCL 10 MG PO TABS
10.0000 mg | ORAL_TABLET | Freq: Once | ORAL | Status: AC
  Administered 2018-06-14: 10 mg via ORAL
  Filled 2018-06-14: qty 1

## 2018-06-14 MED ORDER — OXYCODONE-ACETAMINOPHEN 7.5-325 MG PO TABS: 1.0000 | ORAL_TABLET | Freq: Four times a day (QID) | ORAL | 0 refills | Status: DC | PRN

## 2018-06-14 MED ORDER — OXYCODONE-ACETAMINOPHEN 5-325 MG PO TABS
1.0000 | ORAL_TABLET | Freq: Once | ORAL | Status: AC
  Administered 2018-06-14: 1 via ORAL
  Filled 2018-06-14: qty 1
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
## 2018-06-14 NOTE — Discharge Instructions (Signed)
Follow discharge care instruction take medication as directed.  Be advised of baseline measurements of the left calf is 45.5 cm.  Advised to monitor edema and record readings to discuss with your PCP.
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
## 2019-07-23 ENCOUNTER — Emergency Department: Payer: Medicare Other

## 2019-07-23 ENCOUNTER — Encounter: Payer: Self-pay | Admitting: Emergency Medicine

## 2019-07-23 ENCOUNTER — Other Ambulatory Visit: Payer: Self-pay

## 2019-07-23 ENCOUNTER — Emergency Department
Admission: EM | Admit: 2019-07-23 | Discharge: 2019-07-24 | Disposition: A | Discharge status: Home / Self Care | Payer: Medicare Other | Attending: Emergency Medicine | Admitting: Emergency Medicine

## 2019-07-23 DIAGNOSIS — Z79899 Other long term (current) drug therapy: Secondary | ICD-10-CM | POA: Insufficient documentation

## 2019-07-23 DIAGNOSIS — M79604 Pain in right leg: Secondary | ICD-10-CM

## 2019-07-23 DIAGNOSIS — M79661 Pain in right lower leg: Secondary | ICD-10-CM | POA: Diagnosis present

## 2019-07-23 DIAGNOSIS — I82431 Acute embolism and thrombosis of right popliteal vein: Secondary | ICD-10-CM

## 2019-07-23 DIAGNOSIS — Z87891 Personal history of nicotine dependence: Secondary | ICD-10-CM | POA: Insufficient documentation

## 2019-07-23 HISTORY — DX: Calculus of kidney: N20.0

## 2019-07-23 MED ORDER — APIXABAN 5 MG PO TABS: ORAL_TABLET | ORAL | 0 refills | Status: DC

## 2019-07-23 NOTE — ED Notes (Signed)
Pt c/o pain in right leg and difficulty walking, reports "lump" behind right knee and swelling to right ankle; pt reports hx of clot in left leg approx 8 years prior and is supposed to be on blood thinners but is not

## 2019-07-23 NOTE — ED Provider Notes (Signed)
[facility] Emergency Department Provider Note   ____________________________________________   First MD Initiated Contact with Patient 07/23/19 2306     (approximate)  I have reviewed the triage vital signs and the nursing notes.   HISTORY  Chief Complaint Leg Pain    HPI Ricardo Wood is a 38 y.o. male with past medical history of DVT presents to the ED complaining of leg pain.  Patient reports he has had approximately 48 hours of worsening pain in his right calf.  He also feels like his right leg is more swollen than usual.  He denies any trauma to this extremity, describes pain as similar to prior DVT.  The pain is described as a throbbing that is not exacerbated or alleviated by anything.  He denies any redness or skin changes to the area of pain.  He has not currently taking any anticoagulant, states he was on Lovenox in the past and did not want to go on Coumadin with this was suggested.  It is been multiple years since he has been on anticoagulation and states that his PCP stopped it and transitioned him to aspirin.  He is requesting referral to a new PCP.  He denies any chest pain or shortness of breath.        Past Medical History:  Diagnosis Date  . DVT (deep venous thrombosis) (HCC)   . Kidney stones   . Seizures (HCC)     There are no active problems to display for this patient.   Past Surgical History:  Procedure Laterality Date  . dvt filter      Prior to Admission medications   Medication Sig Start Date End Date Taking? Authorizing Provider  apixaban (ELIQUIS) 5 MG TABS tablet Take 2 tablets (10mg ) twice daily for 7 days, then 1 tablet (5mg ) twice daily 07/23/19   , MD  clonazePAM (KLONOPIN) 1 MG tablet Take 1 mg by mouth 4 (four) times daily.    [provider]  orphenadrine (NORFLEX) 100 MG tablet Take 1 tablet (100 mg total) by mouth 2 (two) times daily. 06/14/18   Chesley Noon, PA-C   oxyCODONE-acetaminophen (PERCOCET) 7.5-325 MG tablet Take 1 tablet by mouth every 6 (six) hours as needed. 06/14/18   01-26-1975, PA-C  oxyCODONE-acetaminophen (ROXICET) 5-325 MG per tablet Take 1-2 tablets by mouth every 4 (four) hours as needed for severe pain. 04/05/15   Beers, Joni Reining, PA-C  promethazine (PHENERGAN) 25 MG tablet Take 1 tablet (25 mg total) by mouth every 6 (six) hours as needed for nausea or vomiting. 04/05/15   Beers, Charmayne Sheer, PA-C    Allergies Patient has no known allergies.  History reviewed. No pertinent family history.  Social History Social History   Tobacco Use  . Smoking status: Former 06/06/15  . Smokeless tobacco: Never Used  Substance Use Topics  . Alcohol use: No  . Drug use: Yes    Types: Marijuana    Comment: last smoked about a week ago    Review of Systems  Constitutional: No fever/chills Eyes: No visual changes. ENT: No sore throat. Cardiovascular: Denies chest pain. Respiratory: Denies shortness of breath. Gastrointestinal: No abdominal pain.  No nausea, no vomiting.  No diarrhea.  No constipation. Genitourinary: Negative for dysuria. Musculoskeletal: Negative for back pain.  Positive for right calf pain and swelling. Skin: Negative for rash. Neurological: Negative for headaches, focal weakness or numbness.  ____________________________________________   PHYSICAL EXAM:  VITAL SIGNS: ED Triage  Vitals  Enc Vitals Group     BP 07/23/19 2004 128/82     Pulse Rate 07/23/19 2004 (!) 104     Resp 07/23/19 2004 17     Temp 07/23/19 2004 100 F (37.8 C)     Temp Source 07/23/19 2004 Oral     SpO2 07/23/19 2004 97 %     Weight 07/23/19 2006 (!) 303 lb (137.4 kg)     Height 07/23/19 2006 6' (1.829 m)     Head Circumference --      Peak Flow --      Pain Score 07/23/19 2005 5     Pain Loc --      Pain Edu? --      Excl. in [place]? --     Constitutional: Alert and oriented. Eyes: Conjunctivae are normal. Head: Atraumatic. Nose:  No congestion/rhinnorhea. Mouth/Throat: Mucous membranes are moist. Neck: Normal ROM Cardiovascular: Normal rate, regular rhythm. Grossly normal heart sounds. Respiratory: Normal respiratory effort.  No retractions. Lungs CTAB. Gastrointestinal: Soft and nontender. No distention. Genitourinary: deferred Musculoskeletal: 1+ edema to distal right lower extremity with tenderness to right calf, no left lower extremity tenderness or edema.  2+ DP pulses bilaterally. Neurologic:  Normal speech and language. No gross focal neurologic deficits are appreciated. Skin:  Skin is warm, dry and intact. No rash noted. Psychiatric: Mood and affect are normal. Speech and behavior are normal.  ____________________________________________   LABS (all labs ordered are listed, but only abnormal results are displayed)  Labs Reviewed - No data to display   PROCEDURES  Procedure(s) performed (including Critical Care):  Procedures   ____________________________________________   INITIAL IMPRESSION / ASSESSMENT AND PLAN / ED COURSE       38 year old male with history of DVT presents to the ED complaining of 2 days of increasing swelling and pain to his right lower extremity, particularly at his calf.  He is neurovascularly intact to his right lower extremity with pulses intact to both lower extremities, strength and sensation intact.  Both lower extremities are warm and well-perfused, with no signs of infectious process.  Ultrasound was obtained of both lower extremities, showing DVT reaching area of popliteal vein on right and no evidence of DVT on left.  Patient will need to go back on anticoagulation, will prescribe Eliquis starter pack and referral to PCP as well as vascular surgery.  Counseled patient to return to the ED for new or worsening symptoms, especially if he were to develop chest pain or shortness of breath.  Patient agrees with plan.      ____________________________________________    FINAL CLINICAL IMPRESSION(S) / ED DIAGNOSES  Final diagnoses:  Acute deep vein thrombosis (DVT) of popliteal vein of right lower extremity [facility])     ED Discharge Orders         Ordered    apixaban (ELIQUIS) 5 MG TABS tablet     07/23/19 2329           Note:  This document was prepared using Dragon voice recognition software and may include unintentional dictation errors.   Blake Divine, MD 07/23/19 2337

## 2019-07-23 NOTE — ED Triage Notes (Addendum)
Pt says he has known DVT to left leg; is supposed to be taking Lovenox or Coumadin but is currently taking nothing; left leg is feeling worse (increased pain and swelling) and now right leg is painful and swollen as well; pain in right leg is from calf to lower thigh area; worse with any movement; denies injury; pt denies shortness of breath; just having pain to both legs;

## 2019-07-23 NOTE — ED Notes (Signed)
Patient transported to Ultrasound 

## 2019-07-25 ENCOUNTER — Encounter: Payer: Self-pay | Admitting: Emergency Medicine

## 2019-07-25 ENCOUNTER — Other Ambulatory Visit: Payer: Self-pay

## 2019-07-25 ENCOUNTER — Observation Stay
Admission: EM | Admit: 2019-07-25 | Discharge: 2019-07-26 | Disposition: A | Discharge status: Home / Self Care | Payer: Medicare Other | Attending: Internal Medicine | Admitting: Internal Medicine

## 2019-07-25 ENCOUNTER — Emergency Department: Payer: Medicare Other

## 2019-07-25 DIAGNOSIS — R918 Other nonspecific abnormal finding of lung field: Secondary | ICD-10-CM | POA: Diagnosis not present

## 2019-07-25 DIAGNOSIS — F209 Schizophrenia, unspecified: Secondary | ICD-10-CM | POA: Diagnosis not present

## 2019-07-25 DIAGNOSIS — Z87442 Personal history of urinary calculi: Secondary | ICD-10-CM | POA: Insufficient documentation

## 2019-07-25 DIAGNOSIS — K760 Fatty (change of) liver, not elsewhere classified: Secondary | ICD-10-CM | POA: Diagnosis not present

## 2019-07-25 DIAGNOSIS — J441 Chronic obstructive pulmonary disease with (acute) exacerbation: Secondary | ICD-10-CM | POA: Diagnosis not present

## 2019-07-25 DIAGNOSIS — Z7901 Long term (current) use of anticoagulants: Secondary | ICD-10-CM | POA: Diagnosis not present

## 2019-07-25 DIAGNOSIS — R569 Unspecified convulsions: Secondary | ICD-10-CM | POA: Diagnosis not present

## 2019-07-25 DIAGNOSIS — I82491 Acute embolism and thrombosis of other specified deep vein of right lower extremity: Principal | ICD-10-CM | POA: Insufficient documentation

## 2019-07-25 DIAGNOSIS — R079 Chest pain, unspecified: Secondary | ICD-10-CM | POA: Diagnosis present

## 2019-07-25 DIAGNOSIS — Z87891 Personal history of nicotine dependence: Secondary | ICD-10-CM | POA: Diagnosis not present

## 2019-07-25 DIAGNOSIS — Z20828 Contact with and (suspected) exposure to other viral communicable diseases: Secondary | ICD-10-CM | POA: Insufficient documentation

## 2019-07-25 LAB — CBC
HCT: 47.1 % (ref 39.0–52.0)
Hemoglobin: 15.9 g/dL (ref 13.0–17.0)
MCH: 31.5 pg (ref 26.0–34.0)
MCHC: 33.8 g/dL (ref 30.0–36.0)
MCV: 93.3 fL (ref 80.0–100.0)
Platelets: 167 10*3/uL (ref 150–400)
RBC: 5.05 MIL/uL (ref 4.22–5.81)
RDW: 13.4 % (ref 11.5–15.5)
WBC: 15.4 10*3/uL — ABNORMAL HIGH (ref 4.0–10.5)
nRBC: 0 % (ref 0.0–0.2)

## 2019-07-25 LAB — BASIC METABOLIC PANEL
Anion gap: 13 (ref 5–15)
BUN: 16 mg/dL (ref 6–20)
CO2: 24 mmol/L (ref 22–32)
Calcium: 9.7 mg/dL (ref 8.9–10.3)
Chloride: 100 mmol/L (ref 98–111)
Creatinine, Ser: 1.43 mg/dL — ABNORMAL HIGH (ref 0.61–1.24)
GFR calc Af Amer: >60 mL/min (ref >60)
GFR calc non Af Amer: >60 mL/min (ref >60)
Glucose, Bld: 176 mg/dL — ABNORMAL HIGH (ref 70–99)
Potassium: 3.9 mmol/L (ref 3.5–5.1)
Sodium: 137 mmol/L (ref 135–145)

## 2019-07-25 LAB — TROPONIN I (HIGH SENSITIVITY)
Troponin I (High Sensitivity): 27 ng/L — ABNORMAL HIGH (ref <18)
Troponin I (High Sensitivity): 45 ng/L — ABNORMAL HIGH (ref <18)
Troponin I (High Sensitivity): 40 ng/L — ABNORMAL HIGH (ref <18)

## 2019-07-25 LAB — CK: Total CK: 111 U/L (ref 49–397)

## 2019-07-25 MED ORDER — IBUPROFEN 400 MG PO TABS: 600.0000 mg | ORAL_TABLET | Freq: Four times a day (QID) | ORAL | Status: DC | PRN

## 2019-07-25 MED ORDER — ASPIRIN 81 MG PO CHEW
324.0000 mg | CHEWABLE_TABLET | Freq: Once | ORAL | Status: AC
  Administered 2019-07-25: 324 mg via ORAL
  Filled 2019-07-25: qty 4

## 2019-07-25 MED ORDER — ONDANSETRON HCL 4 MG/2ML IJ SOLN: 4.0000 mg | Freq: Four times a day (QID) | INTRAMUSCULAR | Status: DC | PRN

## 2019-07-25 MED ORDER — ACETAMINOPHEN 325 MG PO TABS: 650.0000 mg | ORAL_TABLET | Freq: Four times a day (QID) | ORAL | Status: DC | PRN

## 2019-07-25 MED ORDER — ALBUTEROL SULFATE (2.5 MG/3ML) 0.083% IN NEBU: 2.5000 mg | INHALATION_SOLUTION | RESPIRATORY_TRACT | Status: DC | PRN

## 2019-07-25 MED ORDER — IOHEXOL 350 MG/ML SOLN
75.0000 mL | Freq: Once | INTRAVENOUS | Status: AC | PRN
  Administered 2019-07-25: 75 mL via INTRAVENOUS

## 2019-07-25 MED ORDER — ONDANSETRON HCL 4 MG PO TABS: 4.0000 mg | ORAL_TABLET | Freq: Four times a day (QID) | ORAL | Status: DC | PRN

## 2019-07-25 MED ORDER — IPRATROPIUM-ALBUTEROL 0.5-2.5 (3) MG/3ML IN SOLN
3.0000 mL | Freq: Once | RESPIRATORY_TRACT | Status: AC
  Administered 2019-07-25: 22:00:00 3 mL via RESPIRATORY_TRACT
  Filled 2019-07-25: qty 3

## 2019-07-25 MED ORDER — POLYETHYLENE GLYCOL 3350 17 G PO PACK: 17.0000 g | PACK | Freq: Every day | ORAL | Status: DC | PRN

## 2019-07-25 MED ORDER — METHYLPREDNISOLONE SODIUM SUCC 125 MG IJ SOLR
60.0000 mg | INTRAMUSCULAR | Status: DC
  Administered 2019-07-25: 60 mg via INTRAVENOUS
  Filled 2019-07-25: qty 2

## 2019-07-25 MED ORDER — ENOXAPARIN SODIUM 150 MG/ML ~~LOC~~ SOLN
1.0000 mg/kg | Freq: Once | SUBCUTANEOUS | Status: AC
  Administered 2019-07-26: 135 mg via SUBCUTANEOUS
  Filled 2019-07-25 (×2): qty 0.9

## 2019-07-25 MED ORDER — ACETAMINOPHEN 650 MG RE SUPP: 650.0000 mg | Freq: Four times a day (QID) | RECTAL | Status: DC | PRN

## 2019-07-25 MED ORDER — SODIUM CHLORIDE 0.9% FLUSH
3.0000 mL | Freq: Two times a day (BID) | INTRAVENOUS | Status: DC
  Administered 2019-07-26 (×2): 3 mL via INTRAVENOUS

## 2019-07-25 MED ORDER — ASPIRIN EC 81 MG PO TBEC
81.0000 mg | DELAYED_RELEASE_TABLET | Freq: Every day | ORAL | Status: DC
  Administered 2019-07-26: 81 mg via ORAL
  Filled 2019-07-25: qty 1

## 2019-07-25 NOTE — ED Triage Notes (Signed)
Pt reports a couple of days ago he was seen here and dx'd with DVT and d/c. Pt reports was not able to get his meds filled yet and now has CP and some SOB. Pt in police custody at this time.

## 2019-07-25 NOTE — H&P (Addendum)
[employer] - [place] at [facility]   PATIENT NAME: Ricardo [name]    MR#:  [id number]  DATE OF BIRTH:  1981/03/18  DATE OF ADMISSION:  07/25/2019  PRIMARY CARE PHYSICIAN: Armando Gang, FNP   REQUESTING/REFERRING PHYSICIAN: Dr. Roxan Hockey  CHIEF COMPLAINT:   Chief Complaint  Patient presents with  . Shortness of Breath    HISTORY OF PRESENT ILLNESS:  Ricardo [name]  is a 38 y.o. male with a known history of DVT, IVC filter, asthma, tobacco use presents to the emergency room complaining of chest tightness and shortness of breath.  Patient was found to have right lower extremity DVT on 07/23/2019.  Given prescription for Eliquis and discharged home.  He was unable to fill the prescription.  Today patient was caught by the police due to a pending warrant and was then brought to the emergency room with these complaints.  Here patient was found to have some wheezing.  Mildly elevated troponin which increased on repeat check.  CT of the chest showed no pulmonary embolism.  He does complain of some right leg pain with a DVT.  Continues to have mild chest tightness.  Was given aspirin.  EKG shows nothing acute.  PAST MEDICAL HISTORY:   Past Medical History:  Diagnosis Date  . DVT (deep venous thrombosis) (HCC)   . Kidney stones   . Seizures (HCC)     PAST SURGICAL HISTORY:   Past Surgical History:  Procedure Laterality Date  . dvt filter      SOCIAL HISTORY:   Social History   Tobacco Use  . Smoking status: Former Games developer  . Smokeless tobacco: Never Used  Substance Use Topics  . Alcohol use: No    FAMILY HISTORY:  Family history of CAD in his grandfather.  DRUG ALLERGIES:  No Known Allergies  REVIEW OF SYSTEMS:   Review of Systems  Constitutional: Positive for malaise/fatigue. Negative for chills and fever.  HENT: Negative for sore throat.   Eyes: Negative for blurred vision, double vision and pain.  Respiratory: Positive for cough, shortness  of breath and wheezing. Negative for hemoptysis.   Cardiovascular: Positive for chest pain. Negative for palpitations, orthopnea and leg swelling.  Gastrointestinal: Negative for abdominal pain, constipation, diarrhea, heartburn, nausea and vomiting.  Genitourinary: Negative for dysuria and hematuria.  Musculoskeletal: Negative for back pain and joint pain.  Skin: Negative for rash.  Neurological: Negative for sensory change, speech change, focal weakness and headaches.  Endo/Heme/Allergies: Does not bruise/bleed easily.  Psychiatric/Behavioral: Negative for depression. The patient is not nervous/anxious.     MEDICATIONS AT HOME:   Prior to Admission medications   Medication Sig Start Date End Date Taking? Authorizing Provider  apixaban (ELIQUIS) 5 MG TABS tablet Take 2 tablets ( ) twice daily for 7 days, then 1 tablet ( ) twice daily 07/23/19   Chesley Noon, MD  orphenadrine (NORFLEX) 100 MG tablet Take 1 tablet (100 mg total) by mouth 2 (two) times daily. Patient not taking: Reported on 07/25/2019 06/14/18   Joni Reining, PA-C  oxyCODONE-acetaminophen (PERCOCET) 7.5-325 MG tablet Take 1 tablet by mouth every 6 (six) hours as needed. Patient not taking: Reported on 07/25/2019 06/14/18   Joni Reining, PA-C     VITAL SIGNS:  Blood pressure 123/80, pulse 82, temperature 99 F (37.2 C), temperature source Oral, resp. rate 18, height 6' (1.829 m), weight (!) 137 kg, SpO2 95 %.  PHYSICAL EXAMINATION:  Physical Exam  GENERAL:  38 y.o.-year-old patient lying in  the bed with no acute distress.  EYES: Pupils equal, round, reactive to light and accommodation. No scleral icterus. Extraocular muscles intact.  HEENT: Head atraumatic, normocephalic. Oropharynx and nasopharynx clear. No oropharyngeal erythema, moist oral mucosa  NECK:  Supple, no jugular venous distention. No thyroid enlargement, no tenderness.  LUNGS: Bilateral wheezing with decreased air entry CARDIOVASCULAR: S1, S2  normal. No murmurs, rubs, or gallops.  ABDOMEN: Soft, nontender, nondistended. Bowel sounds present. No organomegaly or mass.  EXTREMITIES: No pedal edema, cyanosis, or clubbing. + 2 pedal & radial pulses b/l.   NEUROLOGIC: Cranial nerves II through XII are intact. No focal Motor or sensory deficits appreciated b/l PSYCHIATRIC: The patient is alert and oriented x 3. Good affect.  SKIN: No obvious rash, lesion, or ulcer.   LABORATORY PANEL:   CBC Recent Labs  Lab 07/25/19 1433  WBC 15.4*  HGB 15.9  HCT 47.1  PLT 167   ------------------------------------------------------------------------------------------------------------------  Chemistries  Recent Labs  Lab 07/25/19 1433  NA 137  K 3.9  CL 100  CO2 24  GLUCOSE 176*  BUN 16  CREATININE 1.43*  CALCIUM 9.7   ------------------------------------------------------------------------------------------------------------------  Cardiac Enzymes No results for input(s): TROPONINI in the last 168 hours. ------------------------------------------------------------------------------------------------------------------  RADIOLOGY:  Dg Chest 2 View  Result Date: 07/25/2019 CLINICAL DATA:  Shortness of breath EXAM: CHEST - 2 VIEW COMPARISON:  March 11, 2008 FINDINGS: The heart size and mediastinal contours are within normal limits. Both lungs are clear. The visualized skeletal structures are unremarkable. IMPRESSION: No active cardiopulmonary disease. Electronically Signed   By: Abelardo Diesel M.D.   On: 07/25/2019 15:06   Ct Angio Chest Pe W And/or Wo Contrast  Result Date: 07/25/2019 CLINICAL DATA:  Chest pain. Shortness of breath. Reported history of deep venous thrombosis EXAM: CT ANGIOGRAPHY CHEST WITH CONTRAST TECHNIQUE: Multidetector CT imaging of the chest was performed using the standard protocol during bolus administration of intravenous contrast. Multiplanar CT image reconstructions and MIPs were obtained to evaluate the  vascular anatomy. CONTRAST:  89mL OMNIPAQUE IOHEXOL 350 MG/ML SOLN COMPARISON:  Chest radiograph July 25, 2019 FINDINGS: Cardiovascular: There is no demonstrable pulmonary embolus. There is no thoracic aortic aneurysm or dissection. The visualized great vessels appear unremarkable. There is no appreciable pericardial effusion or pericardial thickening. Mediastinum/Nodes: Visualized thyroid appears unremarkable. No evident thoracic adenopathy. No esophageal lesions are evident. Lungs/Pleura: There is slight bibasilar atelectasis anteriorly. There is no evident edema or consolidation. There is no appreciable pleural effusion. On axial slice 41 series 6, there is a 2 mm nodular opacity in the anterior segment of the left upper lobe. Upper Abdomen: There is hepatic steatosis. Visualized upper abdominal structures otherwise appear unremarkable. Musculoskeletal: There are no blastic or lytic bone lesions. No chest wall lesions are demonstrable. Review of the MIP images confirms the above findings. IMPRESSION: 1. No demonstrable pulmonary embolus. No thoracic aortic aneurysm or dissection. 2. Slight atelectatic change. No edema or consolidation. 2 mm nodular opacity in the left upper lobe. No follow-up needed if patient is low-risk. Non-contrast chest CT can be considered in 12 months if patient is high-risk. This recommendation follows the consensus statement: Guidelines for Management of Incidental Pulmonary Nodules Detected on CT Images: From the Fleischner Society 2017; Radiology 2017; 284:228-243. 3.  No evident adenopathy. 4.  Hepatic steatosis. Electronically Signed   By: Lowella Grip III M.D.   On: 07/25/2019 18:02   US Venous Img Lower Bilateral  Result Date: 07/23/2019 CLINICAL DATA:  Lower extremity pain and swelling,  history of left DVT EXAM: BILATERAL LOWER EXTREMITY VENOUS DOPPLER ULTRASOUND TECHNIQUE: Gray-scale sonography with graded compression, as well as color Doppler and duplex ultrasound  were performed to evaluate the lower extremity deep venous systems from the level of the common femoral vein and including the common femoral, femoral, profunda femoral, popliteal and calf veins including the posterior tibial, peroneal and gastrocnemius veins when visible. The superficial great saphenous vein was also interrogated. Spectral Doppler was utilized to evaluate flow at rest and with distal augmentation maneuvers in the common femoral, femoral and popliteal veins. COMPARISON:  Doppler ultrasound 06/20/2012 FINDINGS: RIGHT LOWER EXTREMITY Common Femoral Vein: No evidence of thrombus. Normal compressibility, respiratory phasicity and response to augmentation. Saphenofemoral Junction: No evidence of thrombus. Normal compressibility and flow on color Doppler imaging. Profunda Femoral Vein: No evidence of thrombus. Normal compressibility and flow on color Doppler imaging. Femoral Vein: No evidence of thrombus. Normal compressibility, respiratory phasicity and response to augmentation. Popliteal Vein: No evidence of thrombus seen within the popliteal vein proper. Normal compressibility, respiratory phasicity and response to augmentation. There is however echogenic thrombus, partially occlusive thrombus which appears to arise from the lesser saphenous vein extending to the confluence with the popliteal vein. Calf Veins: No evidence of thrombus. Normal compressibility and flow on color Doppler imaging. Venous Reflux:  None. Other Findings:  None. LEFT LOWER EXTREMITY Common Femoral Vein: No evidence of thrombus. Normal compressibility, respiratory phasicity and response to augmentation. Saphenofemoral Junction: No evidence of thrombus. Normal compressibility and flow on color Doppler imaging. Profunda Femoral Vein: No evidence of thrombus. Normal compressibility and flow on color Doppler imaging. Femoral Vein: No evidence of acute or residual thrombus. Normal compressibility, respiratory phasicity and response to  augmentation. Popliteal Vein: No evidence of acute or residual thrombus. Normal compressibility, respiratory phasicity and response to augmentation. Calf Veins: No evidence of thrombus. Normal compressibility and flow on color Doppler imaging. Venous Reflux:  None. Other Findings:  None. IMPRESSION: Small amount of partially occlusive thrombus throughout the right lesser saphenous vein extending to the confluence with the right popliteal vein. No other venous thrombosis in the lower extremities. Electronically Signed   By: Kreg Shropshire M.D.   On: 07/23/2019 21:54     IMPRESSION AND PLAN:   *Chest pain with mildly elevated troponin on repeat.  Will repeat troponin again to trend.  Telemetry monitoring.  EKG with nothing acute.  Likely due to COPD exacerbation.  Check echocardiogram.  CTA chest with no PE.  Aspirin daily Patient was given dose of therapeutic subcutaneous Lovenox 1 dose in the ER.  *Right lower extremity DVT.  He has received a full dose Lovenox in the ER tonight.  Can start Eliquis orally tomorrow morning.  Will need coupon.  *Mild COPD exacerbation.  Likely cause of his chest pain.  Will give a dose of IV Solu-Medrol and put him on nebulizers.  Can change to prednisone tomorrow.  *Tobacco abuse.  Counseled to quit smoking for greater than 3 minutes  I was updated by the sheriff in the room that patient is being released and can be discharged home from the hospital.  All the records are reviewed and case discussed with ED provider. Management plans discussed with the patient, family and they are in agreement.  CODE STATUS: Full code  TOTAL TIME TAKING CARE OF THIS PATIENT: 40 minutes.   Molinda Bailiff Tory Mckissack M.D on 07/25/2019 at 8:28 PM  Between 7am to 6pm - Pager - [phone]  After 6pm go to [email or web address] -  password [email or web address] [facility]  [facility] [place] [facility]  Office  [phone]  CC: Primary care physician; Armando GangLindley, Cheryl P, FNP  Note: This dictation was prepared with  Dragon dictation along with smaller phrase technology. Any transcriptional errors that result from this process are unintentional.

## 2019-07-25 NOTE — ED Notes (Signed)
O2 tank replaced, pt is resting, calm with officer present in the family room

## 2019-07-25 NOTE — ED Provider Notes (Signed)
[facility] Emergency Department Provider Note    First MD Initiated Contact with Patient 07/25/19 1802     (approximate)  I have reviewed the triage vital signs and the nursing notes.   HISTORY  Chief Complaint Shortness of Breath    HPI Ricardo [name] is a 38 y.o. male with known DVT recently seen and started on Eliquis but patient did not get prescription filled.  States that he has insurance but thought that he was being given a starter pack from the ER.  Patient arrives in police custody.  States that when he was being detained patient became very anxious with chest pain and shortness of breath.  States he was having a panic attack.  He is denying any chest pain or shortness of breath at this time.  Does have some pain of his right leg.    Past Medical History:  Diagnosis Date  . DVT (deep venous thrombosis) (HCC)   . Kidney stones   . Seizures (HCC)    No family history on file. Past Surgical History:  Procedure Laterality Date  . dvt filter     There are no active problems to display for this patient.     Prior to Admission medications   Medication Sig Start Date End Date Taking? Authorizing Provider  apixaban (ELIQUIS) 5 MG TABS tablet Take 2 tablets (10mg ) twice daily for 7 days, then 1 tablet (5mg ) twice daily 07/23/19   Chesley NoonJessup, Charles, MD  clonazePAM (KLONOPIN) 1 MG tablet Take 1 mg by mouth 4 (four) times daily.    [provider]  orphenadrine (NORFLEX) 100 MG tablet Take 1 tablet (100 mg total) by mouth 2 (two) times daily. 06/14/18   Joni ReiningSmith, Ronald K, PA-C  oxyCODONE-acetaminophen (PERCOCET) 7.5-325 MG tablet Take 1 tablet by mouth every 6 (six) hours as needed. 06/14/18   Joni ReiningSmith, Ronald K, PA-C    Allergies Patient has no known allergies.    Social History Social History   Tobacco Use  . Smoking status: Former Games developermoker  . Smokeless tobacco: Never Used  Substance Use Topics  . Alcohol use: No  . Drug use: Yes   Types: Marijuana    Comment: last smoked about a week ago    Review of Systems Patient denies headaches, rhinorrhea, blurry vision, numbness, shortness of breath, chest pain, edema, cough, abdominal pain, nausea, vomiting, diarrhea, dysuria, fevers, rashes or hallucinations unless otherwise stated above in HPI. ____________________________________________   PHYSICAL EXAM:  VITAL SIGNS: Vitals:   07/25/19 1426 07/25/19 1924  BP: (!) 123/93 123/80  Pulse: (!) 115 82  Resp: 20 18  Temp: 99 F (37.2 C)   SpO2: 98% 95%    Constitutional: Alert and oriented.  Eyes: Conjunctivae are normal.  Head: Atraumatic. Nose: No congestion/rhinnorhea. Mouth/Throat: Mucous membranes are moist.   Neck: No stridor. Painless ROM.  Cardiovascular: Normal rate, regular rhythm. Grossly normal heart sounds.  Good peripheral circulation. Respiratory: Normal respiratory effort.  No retractions. Lungs CTAB. Gastrointestinal: Soft and nontender. No distention. No abdominal bruits. No CVA tenderness. Genitourinary:  Musculoskeletal:  RLE edema and ttp.  No cellulitis.  No joint effusions. Neurologic:  Normal speech and language. No gross focal neurologic deficits are appreciated. No facial droop Skin:  Skin is warm, dry and intact. No rash noted. Psychiatric: Mood and affect are normal. Speech and behavior are normal.  ____________________________________________   LABS (all labs ordered are listed, but only abnormal results are displayed)  Results for orders placed  or performed during the hospital encounter of 07/25/19 (from the past 24 hour(s))  Basic metabolic panel     Status: Abnormal   Collection Time: 07/25/19  2:33 PM  Result Value Ref Range   Sodium 137 135 - 145 mmol/L   Potassium 3.9 3.5 - 5.1 mmol/L   Chloride 100 98 - 111 mmol/L   CO2 24 22 - 32 mmol/L   Glucose, Bld 176 (H) 70 - 99 mg/dL   BUN 16 6 - 20 mg/dL   Creatinine, Ser 1.43 (H) 0.61 - 1.24 mg/dL   Calcium 9.7 8.9 - 10.3  mg/dL   GFR calc non Af Amer >60 >60 mL/min   GFR calc Af Amer >60 >60 mL/min   Anion gap 13 5 - 15  CBC     Status: Abnormal   Collection Time: 07/25/19  2:33 PM  Result Value Ref Range   WBC 15.4 (H) 4.0 - 10.5 K/uL   RBC 5.05 4.22 - 5.81 MIL/uL   Hemoglobin 15.9 13.0 - 17.0 g/dL   HCT 47.1 39.0 - 52.0 %   MCV 93.3 80.0 - 100.0 fL   MCH 31.5 26.0 - 34.0 pg   MCHC 33.8 30.0 - 36.0 g/dL   RDW 13.4 11.5 - 15.5 %   Platelets 167 150 - 400 K/uL   nRBC 0.0 0.0 - 0.2 %  Troponin I (High Sensitivity)     Status: Abnormal   Collection Time: 07/25/19  2:33 PM  Result Value Ref Range   Troponin I (High Sensitivity) 27 (H) <18 ng/L  Troponin I (High Sensitivity)     Status: Abnormal   Collection Time: 07/25/19  7:25 PM  Result Value Ref Range   Troponin I (High Sensitivity) 45 (H) <18 ng/L   ____________________________________________  EKG My review and personal interpretation at Time: 14:37   Indication: sob  Rate: 105  Rhythm: sinus Axis: normal Other: nonspecific st abn, no stemi ____________________________________________  RADIOLOGY  I personally reviewed all radiographic images ordered to evaluate for the above acute complaints and reviewed radiology reports and findings.  These findings were personally discussed with the patient.  Please [name] medical record for radiology report.  ____________________________________________   PROCEDURES  Procedure(s) performed:  .Critical Care Performed by: Merlyn Lot, MD Authorized by: Merlyn Lot, MD   Critical care provider statement:    Critical care time (minutes):  30   Critical care time was exclusive of:  Separately billable procedures and treating other patients   Critical care was necessary to treat or prevent imminent or life-threatening deterioration of the following conditions:  Cardiac failure   Critical care was time spent personally by me on the following activities:  Development of treatment plan with  patient or surrogate, discussions with consultants, evaluation of patient's response to treatment, examination of patient, obtaining history from patient or surrogate, ordering and performing treatments and interventions, ordering and review of laboratory studies, ordering and review of radiographic studies, pulse oximetry, re-evaluation of patient's condition and review of old charts      Critical Care performed: yes ____________________________________________   INITIAL IMPRESSION / [place] / ED COURSE  Pertinent labs & imaging results that were available during my care of the patient were reviewed by me and considered in my medical decision making ([name] chart for details).   DDX: ACS, pericarditis, esophagitis, boerhaaves, pe, dissection, pna, bronchitis, costochondritis   Ricardo [name] is a 38 y.o. who presents to the ED with symptoms as described above.  Imaging blood work will be ordered for blood differential.  Initial enzyme is mildly elevated.  EKG without any evidence of acute ischemia but in the setting of known DVT CTA was ordered.  No evidence of PE.  Does have some wheezing on exam.  Have a lower suspicion for COPD exacerbation or asthma as is not having any hypoxia.  Doubt pneumonia.  No evidence of CHF.  Will observe in the ER for serial enzymes to determine disposition.  Clinical Course as of Jul 25 2003  Tue Jul 25, 2019  1717 Creatinine(!): 1.43 [MF]  1958 Patient's troponin is rising.  He is not having significant pain right now.  Will give nebulizer he does have some wheezing on exam this may be demand ischemia in the setting of asthma or bronchitis.  Given the constellation of findings and his presentation will discuss with hospitalist for admission.   [PR]    Clinical Course User Index [MF] Concha Se, MD [PR] Willy Eddy, MD    The patient was evaluated in Emergency Department today for the symptoms described in the history of present  illness. He/she was evaluated in the context of the global COVID-19 pandemic, which necessitated consideration that the patient might be at risk for infection with the SARS-CoV-2 virus that causes COVID-19. Institutional protocols and algorithms that pertain to the evaluation of patients at risk for COVID-19 are in a state of rapid change based on information released by regulatory bodies including the CDC and federal and state organizations. These policies and algorithms were followed during the patient's care in the ED.  As part of my medical decision making, I reviewed the following data within the electronic MEDICAL RECORD NUMBER Nursing notes reviewed and incorporated, Labs reviewed, notes from prior ED visits and [place] Controlled Substance Database   ____________________________________________   FINAL CLINICAL IMPRESSION(S) / ED DIAGNOSES  Final diagnoses:  Chest pain, unspecified type  Acute deep vein thrombosis (DVT) of other specified vein of right lower extremity (HCC)      NEW MEDICATIONS STARTED DURING THIS VISIT:  New Prescriptions   No medications on file     Note:  This document was prepared using Dragon voice recognition software and may include unintentional dictation errors.    Willy Eddy, MD 07/25/19 2004

## 2019-07-25 NOTE — ED Triage Notes (Signed)
Pt comes into the ED via EMS under police custody with handcuffs in place. Pt was seen here on 10/25 and dx with DVT and is having increased SOB, 93%on RA, placed on 3L [place]. Pt is in NAD at present. Sports coach present.

## 2019-07-26 ENCOUNTER — Observation Stay (HOSPITAL_BASED_OUTPATIENT_CLINIC_OR_DEPARTMENT_OTHER)
Admit: 2019-07-26 | Discharge: 2019-07-26 | Disposition: A | Discharge status: Home / Self Care | Payer: Medicare Other | Attending: Internal Medicine | Admitting: Internal Medicine

## 2019-07-26 DIAGNOSIS — R079 Chest pain, unspecified: Secondary | ICD-10-CM | POA: Diagnosis not present

## 2019-07-26 DIAGNOSIS — I82491 Acute embolism and thrombosis of other specified deep vein of right lower extremity: Secondary | ICD-10-CM | POA: Diagnosis not present

## 2019-07-26 LAB — CBC
HCT: 45.9 % (ref 39.0–52.0)
Hemoglobin: 15.1 g/dL (ref 13.0–17.0)
MCH: 31.2 pg (ref 26.0–34.0)
MCHC: 32.9 g/dL (ref 30.0–36.0)
MCV: 94.8 fL (ref 80.0–100.0)
Platelets: 160 10*3/uL (ref 150–400)
RBC: 4.84 MIL/uL (ref 4.22–5.81)
RDW: 13.1 % (ref 11.5–15.5)
WBC: 12.8 10*3/uL — ABNORMAL HIGH (ref 4.0–10.5)
nRBC: 0 % (ref 0.0–0.2)

## 2019-07-26 LAB — BASIC METABOLIC PANEL
Anion gap: 10 (ref 5–15)
BUN: 15 mg/dL (ref 6–20)
CO2: 23 mmol/L (ref 22–32)
Calcium: 9.2 mg/dL (ref 8.9–10.3)
Chloride: 103 mmol/L (ref 98–111)
Creatinine, Ser: 1.17 mg/dL (ref 0.61–1.24)
GFR calc Af Amer: >60 mL/min (ref >60)
GFR calc non Af Amer: >60 mL/min (ref >60)
Glucose, Bld: 203 mg/dL — ABNORMAL HIGH (ref 70–99)
Potassium: 3.7 mmol/L (ref 3.5–5.1)
Sodium: 136 mmol/L (ref 135–145)

## 2019-07-26 LAB — SARS CORONAVIRUS 2 (TAT 6-24 HRS): SARS Coronavirus 2: NEGATIVE

## 2019-07-26 LAB — ECHOCARDIOGRAM COMPLETE
Height: 72 in
Weight: 4806.4 oz

## 2019-07-26 LAB — HIV ANTIBODY (ROUTINE TESTING W REFLEX): HIV Screen 4th Generation wRfx: NONREACTIVE

## 2019-07-26 MED ORDER — APIXABAN 5 MG PO TABS: 5.0000 mg | ORAL_TABLET | Freq: Two times a day (BID) | ORAL | Status: DC

## 2019-07-26 MED ORDER — APIXABAN 5 MG PO TABS
10.0000 mg | ORAL_TABLET | Freq: Two times a day (BID) | ORAL | Status: DC
  Administered 2019-07-26: 10 mg via ORAL
  Filled 2019-07-26: qty 2

## 2019-07-26 NOTE — Consult Note (Signed)
ANTICOAGULATION CONSULT NOTE - Initial Consult  Pharmacy Consult for Apixaban Indication: DVT  No Known Allergies  Patient Measurements: Height: 6' (182.9 cm) Weight: (!) 300 lb 6.4 oz (136.3 kg) IBW/kg (Calculated) : 77.6  Vital Signs: Temp: 98.3 F (36.8 C) (10/28 0529) Temp Source: Oral (10/28 0529) BP: 132/73 (10/28 0529) Pulse Rate: 85 (10/28 0529)  Labs: Recent Labs    07/25/19 1433 07/25/19 1925 07/25/19 2222 07/26/19 0434  HGB 15.9  --   --  15.1  HCT 47.1  --   --  45.9  PLT 167  --   --  160  CREATININE 1.43*  --   --  1.17  CKTOTAL  --  111  --   --   TROPONINIHS 27* 45* 40*  --     Estimated Creatinine Clearance: 124.8 mL/min (by C-G formula based on SCr of 1.17 mg/dL).   Medical History: Past Medical History:  Diagnosis Date  . DVT (deep venous thrombosis) ([place])   . Kidney stones   . Seizures ([place])     Medications:  Medications Prior to Admission  Medication Sig Dispense Refill Last Dose  . apixaban (ELIQUIS) 5 MG TABS tablet Take 2 tablets (10mg ) twice daily for 7 days, then 1 tablet (5mg ) twice daily 60 tablet 0   . orphenadrine (NORFLEX) 100 MG tablet Take 1 tablet (100 mg total) by mouth 2 (two) times daily. (Patient not taking: Reported on 07/25/2019) 10 tablet 0 Not Taking at Unknown time  . oxyCODONE-acetaminophen (PERCOCET) 7.5-325 MG tablet Take 1 tablet by mouth every 6 (six) hours as needed. (Patient not taking: Reported on 07/25/2019) 20 tablet 0 Not Taking at Unknown time   Scheduled:  . aspirin EC  81 mg Oral Daily  . methylPREDNISolone (SOLU-MEDROL) injection  60 mg Intravenous Q24H  . sodium chloride flush  3 mL Intravenous Q12H   Infusions:   PRN: acetaminophen **OR** acetaminophen, albuterol, ibuprofen, ondansetron **OR** ondansetron (ZOFRAN) IV, polyethylene glycol Anti-infectives (From admission, onward)   None      Assessment: Pharmacy consulted to start apixaban. [place] of leg showed a DVT on 10/25. Patient was started  on apixaban.   Goal of Therapy:  Monitor platelets by anticoagulation protocol: Yes   Plan:  Will give apixaban 10 mg BID for 7 days followed by apixaban 5 mg BID.   Oswald Hillock, PharmD, BCPS 07/26/2019,7:34 AM

## 2019-07-26 NOTE — Progress Notes (Signed)
Received to room 235 from ER via stretcher. Assisted to bed and positioned for comfort. Oriented to room, bed and unit.

## 2019-07-26 NOTE — Clinical Social Work Note (Signed)
CSW provided patient with Eliquis card and notified him of $3.90 copay after the first 30 day supply. Per MD, patient will discharge home today. No further concerns.  CSW signing off.  Dayton Scrape, Kimberly

## 2019-07-26 NOTE — Progress Notes (Signed)
Lanetta Inch to be D/C'd Home per MD order.  Discussed prescriptions and follow up appointments with the patient. Prescriptions given to patient, medication list explained in detail. Pt verbalized understanding.  Allergies as of 07/26/2019   No Known Allergies     Medication List    STOP taking these medications   orphenadrine 100 MG tablet Commonly known as: NORFLEX   oxyCODONE-acetaminophen 7.5-325 MG tablet Commonly known as: Percocet     TAKE these medications   apixaban 5 MG Tabs tablet Commonly known as: Eliquis Take 2 tablets (10mg ) twice daily for 7 days, then 1 tablet (5mg ) twice daily       Vitals:   07/26/19 0529 07/26/19 0803  BP: 132/73 136/88  Pulse: 85 89  Resp: 20 20  Temp: 98.3 F (36.8 C) 98 F (36.7 C)  SpO2: 93% 98%    Tele box removed and returned.Skin clean, dry and intact without evidence of skin break down, no evidence of skin tears noted. IV catheter discontinued intact. Site without signs and symptoms of complications. Dressing and pressure applied. Pt denies pain at this time. No complaints noted.  An After Visit Summary was printed and given to the patient. Patient escorted via [place], and D/C home via private auto.  Rolley Sims

## 2019-07-26 NOTE — TOC Benefit Eligibility Note (Signed)
Transition of Care [facility]) Benefit Eligibility Note    Patient Details  Name: Ricardo Wood MRN: [id number] Date of Birth: 1980-12-20   Medication/Dose: Eliquis - take two 10mg  tablets BID for seven days, then one 5mg  tablet BID going forward.  Covered?: Yes  Prescription Coverage Preferred Pharmacy: [facility] considered standard pharmacy, [place] preferred.  Spoke with Person/Company/Phone Number:: Mitzi Hansen with [facility] Medicare at [phone]  Co-Pay: $3.90 estimated copay  Prior Approval: No  Deductible: (No deductible.  Patient has LIS on plan.)   Dannette Barbara Phone Number: [phone] 07/26/2019, 10:46 AM

## 2019-07-26 NOTE — Discharge Summary (Signed)
[facility] -  at [facility]   PATIENT NAME: Ricardo Wood    MR#:  [id number]  DATE OF BIRTH:  05/22/1981  DATE OF ADMISSION:  07/25/2019 ADMITTING PHYSICIAN: Milagros Loll, MD  DATE OF DISCHARGE: 07/26/2019  PRIMARY CARE PHYSICIAN: Armando Gang, FNP    ADMISSION DIAGNOSIS:  Acute deep vein thrombosis (DVT) of other specified vein of right lower extremity (HCC) [I82.491] Chest pain, unspecified type [R07.9]  DISCHARGE DIAGNOSIS:  acute DVT right lower extremity  SECONDARY DIAGNOSIS:   Past Medical History:  Diagnosis Date  . DVT (deep venous thrombosis) (HCC)   . Kidney stones   . Seizures [facility])     HOSPITAL COURSE:  Ricardo Wood  is a 38 y.o. male with a known history of DVT, IVC filter, asthma, tobacco use presents to the emergency room complaining of chest tightness and shortness of breath.  Patient was found to have right lower extremity DVT on 07/23/2019.  Given prescription for Eliquis and discharged home.  He was unable to fill the prescription.  Today patient was caught by the police due to a pending warrant and was then brought to the emergency room with these complaints.    *Right lower extremity DVT.  He has received a full dose Lovenox in the ER yday -restart Eliquis orally today. CSW to provide coupon. Pt given new rx  *Chest pain with mildly elevated troponin on repeat.  Will repeat troponin again to trend.  Telemetry monitoring NSR -  EKG with nothing acute.  Likely due to COPD exacerbation.  -  CTA chest with no PE.   *Mild COPD exacerbation.  Likely cause of his chest pain.  Will give a dose of IV Solu-Medrol and put him on nebulizers.   -pt denies any CP or sob -no wheezing -saturations 98% on room air  *Tobacco abuse.  Counseled to quit smoking for greater than 3 minutes  *History of schizophrenia-- patient says he has disability through that. He follows up with [facility].  Overall feels better. Okay to go  home. Wife in the room. Discharge plan discussed.  Patient advised to get primary care physician in the area.  CONSULTS OBTAINED:    DRUG ALLERGIES:  No Known Allergies  DISCHARGE MEDICATIONS:   Allergies as of 07/26/2019   No Known Allergies     Medication List    STOP taking these medications   orphenadrine 100 MG tablet Commonly known as: NORFLEX   oxyCODONE-acetaminophen 7.5-325 MG tablet Commonly known as: Percocet     TAKE these medications   apixaban 5 MG Tabs tablet Commonly known as: Eliquis Take 2 tablets (10mg ) twice daily for 7 days, then 1 tablet (5mg ) twice daily       If you experience worsening of your admission symptoms, develop shortness of breath, life threatening emergency, suicidal or homicidal thoughts you must seek medical attention immediately by calling 911 or calling your MD immediately  if symptoms less severe.  You Must read complete instructions/literature along with all the possible adverse reactions/side effects for all the Medicines you take and that have been prescribed to you. Take any new Medicines after you have completely understood and accept all the possible adverse reactions/side effects.   Please note  You were cared for by a hospitalist during your hospital stay. If you have any questions about your discharge medications or the care you received while you were in the hospital after you are discharged, you can call the unit and asked to  speak with the hospitalist on call if the hospitalist that took care of you is not available. Once you are discharged, your primary care physician will handle any further medical issues. Please note that NO REFILLS for any discharge medications will be authorized once you are discharged, as it is imperative that you return to your primary care physician (or establish a relationship with a primary care physician if you do not have one) for your aftercare needs so that they can reassess your need for  medications and monitor your lab values. Today   SUBJECTIVE   Feels a whole lot better today. No shortness of breath cough or fever  VITAL SIGNS:  Blood pressure 136/88, pulse 89, temperature 98 F (36.7 C), temperature source Oral, resp. rate 20, height 6' (1.829 m), weight (!) 136.3 kg, SpO2 98 %.  I/O:    Intake/Output Summary (Last 24 hours) at 07/26/2019 1055 Last data filed at 07/26/2019 0951 Gross per 24 hour  Intake 240 ml  Output 0 ml  Net 240 ml    PHYSICAL EXAMINATION:  GENERAL:  38 y.o.-year-old patient lying in the bed with no acute distress. Obese EYES: Pupils equal, round, reactive to light and accommodation. No scleral icterus. Extraocular muscles intact.  HEENT: Head atraumatic, normocephalic. Oropharynx and nasopharynx clear.  NECK:  Supple, no jugular venous distention. No thyroid enlargement, no tenderness.  LUNGS: Normal breath sounds bilaterally, no wheezing, rales,rhonchi or crepitation. No use of accessory muscles of respiration.  CARDIOVASCULAR: S1, S2 normal. No murmurs, rubs, or gallops.  ABDOMEN: Soft, non-tender, non-distended. Bowel sounds present. No organomegaly or mass.  EXTREMITIES: No pedal edema, cyanosis, or clubbing.  NEUROLOGIC: Cranial nerves II through XII are intact. Muscle strength 5/5 in all extremities. Sensation intact. Gait not checked.  PSYCHIATRIC: The patient is alert and oriented x 3.  SKIN: No obvious rash, lesion, or ulcer.   DATA REVIEW:   CBC  Recent Labs  Lab 07/26/19 0434  WBC 12.8*  HGB 15.1  HCT 45.9  PLT 160    Chemistries  Recent Labs  Lab 07/26/19 0434  NA 136  K 3.7  CL 103  CO2 23  GLUCOSE 203*  BUN 15  CREATININE 1.17  CALCIUM 9.2    Microbiology Results   Recent Results (from the past 240 hour(s))  SARS CORONAVIRUS 2 (TAT 6-24 HRS) Nasopharyngeal Nasopharyngeal Swab     Status: None   Collection Time: 07/25/19  8:42 PM   Specimen: Nasopharyngeal Swab  Result Value Ref Range Status    SARS Coronavirus 2 NEGATIVE NEGATIVE Final    Comment: (NOTE) SARS-CoV-2 target nucleic acids are NOT DETECTED. The SARS-CoV-2 RNA is generally detectable in upper and lower respiratory specimens during the acute phase of infection. Negative results do not preclude SARS-CoV-2 infection, do not rule out co-infections with other pathogens, and should not be used as the sole basis for treatment or other patient management decisions. Negative results must be combined with clinical observations, patient history, and epidemiological information. The expected result is Negative. Fact Sheet for Patients: HairSlick.nohttps://www.fda.gov/media/138098/download Fact Sheet for Healthcare Providers: quierodirigir.comhttps://www.fda.gov/media/138095/download This test is not yet approved or cleared by the [place] FDA and  has been authorized for detection and/or diagnosis of SARS-CoV-2 by FDA under an Emergency Use Authorization (EUA). This EUA will remain  in effect (meaning this test can be used) for the duration of the COVID-19 declaration under Section 56 4(b)(1) of the Act, 21 U.S.C. section 360bbb-3(b)(1), unless the authorization is terminated or revoked  sooner. Performed at [facility], [place] [address], [place], [place] [address]     RADIOLOGY:  Dg Chest 2 View  Result Date: 07/25/2019 CLINICAL DATA:  Shortness of breath EXAM: CHEST - 2 VIEW COMPARISON:  March 11, 2008 FINDINGS: The heart size and mediastinal contours are within normal limits. Both lungs are clear. The visualized skeletal structures are unremarkable. IMPRESSION: No active cardiopulmonary disease. Electronically Signed   By: Abelardo Diesel M.D.   On: 07/25/2019 15:06   Ct Angio Chest Pe W And/or Wo Contrast  Result Date: 07/25/2019 CLINICAL DATA:  Chest pain. Shortness of breath. Reported history of deep venous thrombosis EXAM: CT ANGIOGRAPHY CHEST WITH CONTRAST TECHNIQUE: Multidetector CT imaging of the chest was performed using the  standard protocol during bolus administration of intravenous contrast. Multiplanar CT image reconstructions and MIPs were obtained to evaluate the vascular anatomy. CONTRAST:  80mL OMNIPAQUE IOHEXOL 350 MG/ML SOLN COMPARISON:  Chest radiograph July 25, 2019 FINDINGS: Cardiovascular: There is no demonstrable pulmonary embolus. There is no thoracic aortic aneurysm or dissection. The visualized great vessels appear unremarkable. There is no appreciable pericardial effusion or pericardial thickening. Mediastinum/Nodes: Visualized thyroid appears unremarkable. No evident thoracic adenopathy. No esophageal lesions are evident. Lungs/Pleura: There is slight bibasilar atelectasis anteriorly. There is no evident edema or consolidation. There is no appreciable pleural effusion. On axial slice 41 series 6, there is a 2 mm nodular opacity in the anterior segment of the left upper lobe. Upper Abdomen: There is hepatic steatosis. Visualized upper abdominal structures otherwise appear unremarkable. Musculoskeletal: There are no blastic or lytic bone lesions. No chest wall lesions are demonstrable. Review of the MIP images confirms the above findings. IMPRESSION: 1. No demonstrable pulmonary embolus. No thoracic aortic aneurysm or dissection. 2. Slight atelectatic change. No edema or consolidation. 2 mm nodular opacity in the left upper lobe. No follow-up needed if patient is low-risk. Non-contrast chest CT can be considered in 12 months if patient is high-risk. This recommendation follows the consensus statement: Guidelines for Management of Incidental Pulmonary Nodules Detected on CT Images: From the Fleischner Society 2017; Radiology 2017; 284:228-243. 3.  No evident adenopathy. 4.  Hepatic steatosis. Electronically Signed   By: Lowella Grip III M.D.   On: 07/25/2019 18:02     CODE STATUS:     Code Status Orders  (From admission, onward)         Start     Ordered   07/25/19 2025  Full code  Continuous      07/25/19 2028        Code Status History    This patient has a current code status but no historical code status.   Advance Care Planning Activity      TOTAL TIME TAKING CARE OF THIS PATIENT: *40* minutes.    Fritzi Mandes M.D on 07/26/2019 at 10:55 AM  Between 7am to 6pm - Pager - [phone] After 6pm go to [email or web address] - password [email or web address] [facility]  Office  [phone]  CC: Primary care physician; Remi Haggard, FNP

## 2019-07-26 NOTE — Progress Notes (Signed)
*  PRELIMINARY RESULTS* Echocardiogram 2D Echocardiogram has been performed.  Ricardo Wood 07/26/2019, 10:30 AM

## 2019-10-06 ENCOUNTER — Emergency Department
Admission: EM | Admit: 2019-10-06 | Discharge: 2019-10-06 | Disposition: A | Discharge status: Home / Self Care | Payer: Medicare Other | Attending: Emergency Medicine | Admitting: Emergency Medicine

## 2019-10-06 ENCOUNTER — Encounter: Payer: Self-pay | Admitting: Emergency Medicine

## 2019-10-06 ENCOUNTER — Other Ambulatory Visit: Payer: Self-pay

## 2019-10-06 DIAGNOSIS — Z87891 Personal history of nicotine dependence: Secondary | ICD-10-CM | POA: Insufficient documentation

## 2019-10-06 DIAGNOSIS — Z7901 Long term (current) use of anticoagulants: Secondary | ICD-10-CM | POA: Insufficient documentation

## 2019-10-06 DIAGNOSIS — K029 Dental caries, unspecified: Secondary | ICD-10-CM | POA: Diagnosis not present

## 2019-10-06 DIAGNOSIS — K047 Periapical abscess without sinus: Secondary | ICD-10-CM | POA: Diagnosis not present

## 2019-10-06 DIAGNOSIS — K0889 Other specified disorders of teeth and supporting structures: Secondary | ICD-10-CM | POA: Diagnosis present

## 2019-10-06 MED ORDER — KETOROLAC TROMETHAMINE 30 MG/ML IJ SOLN
30.0000 mg | Freq: Once | INTRAMUSCULAR | Status: AC
  Administered 2019-10-06: 18:00:00 30 mg via INTRAMUSCULAR
  Filled 2019-10-06: qty 1

## 2019-10-06 MED ORDER — AMOXICILLIN 500 MG PO CAPS: 500.0000 mg | ORAL_CAPSULE | Freq: Three times a day (TID) | ORAL | 0 refills | Status: DC

## 2019-10-06 MED ORDER — KETOROLAC TROMETHAMINE 30 MG/ML IJ SOLN: 30.0000 mg | Freq: Once | INTRAMUSCULAR | Status: DC

## 2019-10-06 MED ORDER — AMOXICILLIN 500 MG PO CAPS
500.0000 mg | ORAL_CAPSULE | Freq: Once | ORAL | Status: AC
  Administered 2019-10-06: 18:00:00 500 mg via ORAL
  Filled 2019-10-06: qty 1

## 2019-10-06 MED ORDER — TRAMADOL HCL 50 MG PO TABS: 50.0000 mg | ORAL_TABLET | Freq: Three times a day (TID) | ORAL | 0 refills | Status: AC | PRN

## 2019-10-06 MED ORDER — ACETAMINOPHEN 325 MG PO TABS
650.0000 mg | ORAL_TABLET | Freq: Once | ORAL | Status: AC
  Administered 2019-10-06: 18:00:00 650 mg via ORAL
  Filled 2019-10-06: qty 2

## 2019-10-06 MED ORDER — LIDOCAINE VISCOUS HCL 2 % MT SOLN
15.0000 mL | Freq: Once | OROMUCOSAL | Status: AC
  Administered 2019-10-06: 18:00:00 15 mL via OROMUCOSAL
  Filled 2019-10-06: qty 15

## 2019-10-06 MED ORDER — LIDOCAINE VISCOUS HCL 2 % MT SOLN: 10.0000 mL | OROMUCOSAL | 0 refills | Status: DC | PRN

## 2019-10-06 MED ORDER — KETOROLAC TROMETHAMINE 30 MG/ML IJ SOLN: 15.0000 mg | Freq: Once | INTRAMUSCULAR | Status: DC

## 2019-10-06 NOTE — ED Notes (Signed)
See triage note  Presents with possible dental abscess       States developed pain to left side of jaw  Min swelling noted

## 2019-10-06 NOTE — ED Provider Notes (Signed)
[facility] Emergency Department Provider Note  ____________________________________________  Time seen: Approximately 5:10 PM  I have reviewed the triage vital signs and the nursing notes.   HISTORY  Chief Complaint Dental Abscess    HPI Ricardo Wood is a 39 y.o. male that presents to the emergency department for evaluation of right bottom dental pain for 2 days.  Patient states that all of his teeth on the bottom on that side are broken.  Pain is radiating into his ear.  Patient states that he has an appointment with [facility] in 3 weeks but would like to find a different dentist.  No fevers.   Past Medical History:  Diagnosis Date  . DVT (deep venous thrombosis) (HCC)   . Kidney stones   . Seizures [facility])     Patient Active Problem List   Diagnosis Date Noted  . Chest pain 07/25/2019    Past Surgical History:  Procedure Laterality Date  . dvt filter      Prior to Admission medications   Medication Sig Start Date End Date Taking? Authorizing Provider  amoxicillin (AMOXIL) 500 MG capsule Take 1 capsule (500 mg total) by mouth 3 (three) times daily. 10/06/19   Enid Derry, PA-C  apixaban (ELIQUIS) 5 MG TABS tablet Take 2 tablets (10mg ) twice daily for 7 days, then 1 tablet (5mg ) twice daily 07/23/19   , MD  lidocaine (XYLOCAINE) 2 % solution Use as directed 10 mLs in the mouth or throat as needed. 10/06/19   Chesley Noon, PA-C  traMADol (ULTRAM) 50 MG tablet Take 1 tablet (50 mg total) by mouth every 8 (eight) hours as needed for up to 2 days. 10/06/19 10/08/19  12/04/19, PA-C    Allergies Patient has no known allergies.  History reviewed. No pertinent family history.  Social History Social History   Tobacco Use  . Smoking status: Former 12/06/19  . Smokeless tobacco: Never Used  Substance Use Topics  . Alcohol use: No  . Drug use: Yes    Types: Marijuana    Comment: last smoked about a week ago      Review of Systems  Constitutional: No fever/chills Respiratory: No SOB. Musculoskeletal: Negative for musculoskeletal pain. Skin: Negative for rash, abrasions, lacerations, ecchymosis. Neurological: Negative for headaches   ____________________________________________   PHYSICAL EXAM:  VITAL SIGNS: ED Triage Vitals [10/06/19 1527]  Enc Vitals Group     BP (!) 152/105     Pulse Rate 76     Resp 20     Temp 99 F (37.2 C)     Temp Source Oral     SpO2 99 %     Weight 300 lb (136.1 kg)     Height 6' (1.829 m)     Head Circumference      Peak Flow      Pain Score 9     Pain Loc      Pain Edu?      Excl. in GC?      Constitutional: Alert and oriented. Well appearing and in no acute distress. Eyes: Conjunctivae are normal. PERRL. EOMI. Head: Atraumatic. ENT:      Ears:      Nose: No congestion/rhinnorhea.      Mouth/Throat: Mucous membranes are moist.  Bottom left molars decayed to the gumline.  Full range of motion of jaw.  Minimal swelling. Neck: No stridor.  Cardiovascular: Normal rate, regular rhythm.  Good peripheral circulation. Respiratory: Normal respiratory effort without  tachypnea or retractions. Lungs CTAB. Good air entry to the bases with no decreased or absent breath sounds. Musculoskeletal: Full range of motion to all extremities. No gross deformities appreciated. Neurologic:  Normal speech and language. No gross focal neurologic deficits are appreciated.  Skin:  Skin is warm, dry and intact. No rash noted. Psychiatric: Mood and affect are normal. Speech and behavior are normal. Patient exhibits appropriate insight and judgement.   ____________________________________________   LABS (all labs ordered are listed, but only abnormal results are displayed)  Labs Reviewed - No data to display ____________________________________________  EKG   ____________________________________________  RADIOLOGY  No results  found.  ____________________________________________    PROCEDURES  Procedure(s) performed:    Procedures    Medications  lidocaine (XYLOCAINE) 2 % viscous mouth solution 15 mL (15 mLs Mouth/Throat Given 10/06/19 1801)  amoxicillin (AMOXIL) capsule 500 mg (500 mg Oral Given 10/06/19 1800)  acetaminophen (TYLENOL) tablet 650 mg (650 mg Oral Given 10/06/19 1800)  ketorolac (TORADOL) 30 MG/ML injection 30 mg (30 mg Intramuscular Given 10/06/19 1801)     ____________________________________________   INITIAL IMPRESSION / ASSESSMENT AND PLAN / ED COURSE  Pertinent labs & imaging results that were available during my care of the patient were reviewed by me and considered in my medical decision making (see chart for details).  Review of the [place] CSRS was performed in accordance of the [place] prior to dispensing any controlled drugs.     Patient's diagnosis is consistent with dental abscess. Patient will be discharged home with prescriptions for viscous lidocaine,  Amoxicillin, and a short course of tramadol. Patient is to follow up with dentist as directed. Patient is given ED precautions to return to the ED for any worsening or new symptoms.  Ricardo Wood was evaluated in Emergency Department on 10/06/2019 for the symptoms described in the history of present illness. He was evaluated in the context of the global COVID-19 pandemic, which necessitated consideration that the patient might be at risk for infection with the SARS-CoV-2 virus that causes COVID-19. Institutional protocols and algorithms that pertain to the evaluation of patients at risk for COVID-19 are in a state of rapid change based on information released by regulatory bodies including the CDC and federal and state organizations. These policies and algorithms were followed during the patient's care in the ED.   ____________________________________________  FINAL CLINICAL IMPRESSION(S) / ED DIAGNOSES  Final diagnoses:   Dental abscess      NEW MEDICATIONS STARTED DURING THIS VISIT:  ED Discharge Orders         Ordered    amoxicillin (AMOXIL) 500 MG capsule  3 times daily     10/06/19 1725    traMADol (ULTRAM) 50 MG tablet  Every 8 hours PRN     10/06/19 1725    lidocaine (XYLOCAINE) 2 % solution  As needed     10/06/19 1725              This chart was dictated using voice recognition software/Dragon. Despite best efforts to proofread, errors can occur which can change the meaning. Any change was purely unintentional.    Laban Emperor, PA-C 10/06/19 1851    Earleen Newport, MD 10/06/19 2116

## 2019-10-06 NOTE — ED Triage Notes (Signed)
Pt presents to ED via POV with c/o dental abscess to L side of jaw. Pt states pain x 2 days that is progressively getting worse at this time. Pt states has several cracked teeth in the L side of his mouth.

## 2020-01-31 ENCOUNTER — Other Ambulatory Visit: Payer: Self-pay

## 2020-01-31 ENCOUNTER — Encounter: Payer: Self-pay | Admitting: Emergency Medicine

## 2020-01-31 ENCOUNTER — Emergency Department
Admission: EM | Admit: 2020-01-31 | Discharge: 2020-01-31 | Disposition: A | Discharge status: Home / Self Care | Payer: Medicare Other | Attending: Emergency Medicine | Admitting: Emergency Medicine

## 2020-01-31 DIAGNOSIS — Z87891 Personal history of nicotine dependence: Secondary | ICD-10-CM | POA: Insufficient documentation

## 2020-01-31 DIAGNOSIS — L578 Other skin changes due to chronic exposure to nonionizing radiation: Secondary | ICD-10-CM | POA: Insufficient documentation

## 2020-01-31 DIAGNOSIS — K047 Periapical abscess without sinus: Secondary | ICD-10-CM | POA: Diagnosis present

## 2020-01-31 DIAGNOSIS — X32XXXA Exposure to sunlight, initial encounter: Secondary | ICD-10-CM | POA: Diagnosis not present

## 2020-01-31 DIAGNOSIS — Z79899 Other long term (current) drug therapy: Secondary | ICD-10-CM | POA: Diagnosis not present

## 2020-01-31 MED ORDER — HYDROXYZINE HCL 25 MG PO TABS: 25.0000 mg | ORAL_TABLET | Freq: Three times a day (TID) | ORAL | 0 refills | Status: DC | PRN

## 2020-01-31 MED ORDER — AMOXICILLIN 500 MG PO CAPS: 500.0000 mg | ORAL_CAPSULE | Freq: Three times a day (TID) | ORAL | 0 refills | Status: DC

## 2020-01-31 MED ORDER — HYDROCORTISONE 2.5 % EX LOTN: TOPICAL_LOTION | Freq: Two times a day (BID) | CUTANEOUS | 0 refills | Status: DC

## 2020-01-31 MED ORDER — MAGIC MOUTHWASH W/LIDOCAINE: 10.0000 mL | Freq: Four times a day (QID) | ORAL | 0 refills | Status: DC | PRN

## 2020-01-31 NOTE — Discharge Instructions (Signed)
Please follow-up with the dentist as soon as possible.  Return to the emergency department for symptoms of concern if you are unable to see a dentist.  Make sure you wear sunscreen when you are outside.

## 2020-01-31 NOTE — ED Triage Notes (Signed)
Pt presents via pov with c/o abscess around broken teeth on lower gum. Tooth decay noted to lower back left dental plate. Pt states he was supposed to follow up with dentist, but is in process of finding new one. Pt requesting antibiotics and lidocaine mouth wash if possible. Pt afebrile, alert and oriented x4. Pt talking in complete sentences, no distress noted.

## 2020-01-31 NOTE — ED Provider Notes (Signed)
[facility] Emergency Department Provider Note  ____________________________________________  Time seen: Approximately 6:52 PM  I have reviewed the triage vital signs and the nursing notes.   HISTORY  Chief Complaint Abscess   HPI Ricardo Wood is a 39 y.o. male presents to the emergency department for evalauation of pain in the left lower jaw concerning for dental abscess. He has had similar symptoms in the past and used Orajel with relief. This time it is not working and the pain is now increasing and moving up to the left ear. This morning he noticed a "bump" on his gum.  Past Medical History:  Diagnosis Date  . DVT (deep venous thrombosis) ([place])   . Kidney stones   . Seizures [facility])     Patient Active Problem List   Diagnosis Date Noted  . Chest pain 07/25/2019    Past Surgical History:  Procedure Laterality Date  . dvt filter      Prior to Admission medications   Medication Sig Start Date End Date Taking? Authorizing Provider  amoxicillin (AMOXIL) 500 MG capsule Take 1 capsule (500 mg total) by mouth 3 (three) times daily. 01/31/20   Jaki Hammerschmidt, Dessa Phi, FNP  apixaban (ELIQUIS) 5 MG TABS tablet Take 2 tablets (10mg ) twice daily for 7 days, then 1 tablet (5mg ) twice daily 07/23/19   Blake Divine, MD  hydrocortisone 2.5 % lotion Apply topically 2 (two) times daily. 01/31/20   Rocky Gladden, Johnette Abraham B, FNP  hydrOXYzine (ATARAX/VISTARIL) 25 MG tablet Take 1 tablet (25 mg total) by mouth 3 (three) times daily as needed for itching. 01/31/20   Petros Ahart B, FNP  lidocaine (XYLOCAINE) 2 % solution Use as directed 10 mLs in the mouth or throat as needed. 10/06/19   Laban Emperor, PA-C  magic mouthwash w/lidocaine SOLN Take 10 mLs by mouth 4 (four) times daily as needed for mouth pain. 01/31/20   Victorino Dike, FNP    Allergies Patient has no known allergies.  History reviewed. No pertinent family history.  Social History Social History   Tobacco Use   . Smoking status: Former Research scientist (life sciences)  . Smokeless tobacco: Never Used  Substance Use Topics  . Alcohol use: No  . Drug use: Yes    Types: Marijuana    Comment: last smoked about a week ago    Review of Systems  Constitutional: Negative for fever. Respiratory: Negative for cough or shortness of breath.  Musculoskeletal: Negative for myalgias Skin: Positive for maculopapular rash on upper extremities. First degree sunburn on exposed skin.  Neurological: Negative for numbness or paresthesias. ____________________________________________   PHYSICAL EXAM:  VITAL SIGNS: ED Triage Vitals  Enc Vitals Group     BP 01/31/20 1811 (!) 146/89     Pulse Rate 01/31/20 1811 99     Resp 01/31/20 1811 16     Temp 01/31/20 1811 98.6 F (37 C)     Temp Source 01/31/20 1811 Oral     SpO2 01/31/20 1811 97 %     Weight 01/31/20 1812 (!) 315 lb (142.9 kg)     Height 01/31/20 1812 6' (1.829 m)     Head Circumference --      Peak Flow --      Pain Score 01/31/20 1811 4     Pain Loc --      Pain Edu? --      Excl. in [place]? --      Constitutional: Overall well appearing. Eyes: Conjunctivae are clear without discharge or  drainage. Nose: No rhinorrhea noted. Mouth/Throat: Airway is patent. Dental decay with gingival erythema and edema on the left lower jaw. Neck: No stridor. Unrestricted range of motion observed. Cardiovascular: Capillary refill is <3 seconds.  Respiratory: Respirations are even and unlabored.. Musculoskeletal: Unrestricted range of motion observed. Neurologic: Awake, alert, and oriented x 4.  Skin: Maculopapular rash overlying first degree sunburn on arms, neck, and face.  ____________________________________________   LABS (all labs ordered are listed, but only abnormal results are displayed)  Labs Reviewed - No data to display ____________________________________________  EKG  Not indicated. ____________________________________________  RADIOLOGY  Not  indicated. ____________________________________________   PROCEDURES  Procedures ____________________________________________   INITIAL IMPRESSION / ASSESSMENT AND PLAN / ED COURSE  Ricardo Wood is a 39 y.o. male presents to the emergency department for treatment and evaluation of dental pain and rash overlying first-degree sunburn.  Patient states that he has been treating dental pain with Orajel without any relief.  Sunburn occurred yesterday while he was out side cleaning out some debris in the yard.  He will be discharged home with amoxicillin, Magic mouthwash, and hydrocortisone cream.   Medications - No data to display   Pertinent labs & imaging results that were available during my care of the patient were reviewed by me and considered in my medical decision making (see chart for details).  ____________________________________________   FINAL CLINICAL IMPRESSION(S) / ED DIAGNOSES  Final diagnoses:  Dental abscess  Dermatitis due to sunburn    ED Discharge Orders         Ordered    amoxicillin (AMOXIL) 500 MG capsule  3 times daily     01/31/20 1959    hydrocortisone 2.5 % lotion  2 times daily     01/31/20 1959    magic mouthwash w/lidocaine SOLN  4 times daily PRN    Note to Pharmacy: Diphenhydramine HCL, Alum & Mag Hydroxide-Simeth, Nystatin, Lidocaine HCL  Mix 1:1:1 with 20 ml of lidocaine   01/31/20 1959    hydrOXYzine (ATARAX/VISTARIL) 25 MG tablet  3 times daily PRN     01/31/20 1959           Note:  This document was prepared using Dragon voice recognition software and may include unintentional dictation errors.   Chinita Pester, FNP 01/31/20 Cletis Athens, MD 01/31/20 2123

## 2020-01-31 NOTE — ED Notes (Signed)
Pt states pain to the left lower jaw. Pt states multiple cavities with a "knot" forming. Pt states going through 5 things of oral lidocaine and alternating tylenol and motrin. Pt states the pain never fully goes away but will go down to a 1/10. Pt states a hard time closing his teeth.   Pt also states sunburns with eczema that he would like a lotion or cream for.

## 2020-03-06 ENCOUNTER — Other Ambulatory Visit: Payer: Self-pay

## 2020-03-06 ENCOUNTER — Emergency Department: Payer: Medicare Other

## 2020-03-06 ENCOUNTER — Emergency Department
Admission: EM | Admit: 2020-03-06 | Discharge: 2020-03-06 | Disposition: A | Discharge status: Home / Self Care | Payer: Medicare Other | Attending: Emergency Medicine | Admitting: Emergency Medicine

## 2020-03-06 DIAGNOSIS — Z87891 Personal history of nicotine dependence: Secondary | ICD-10-CM | POA: Diagnosis not present

## 2020-03-06 DIAGNOSIS — Z86718 Personal history of other venous thrombosis and embolism: Secondary | ICD-10-CM | POA: Insufficient documentation

## 2020-03-06 DIAGNOSIS — F1290 Cannabis use, unspecified, uncomplicated: Secondary | ICD-10-CM | POA: Diagnosis not present

## 2020-03-06 DIAGNOSIS — R0602 Shortness of breath: Secondary | ICD-10-CM | POA: Diagnosis not present

## 2020-03-06 DIAGNOSIS — I82422 Acute embolism and thrombosis of left iliac vein: Secondary | ICD-10-CM | POA: Insufficient documentation

## 2020-03-06 DIAGNOSIS — I824Z2 Acute embolism and thrombosis of unspecified deep veins of left distal lower extremity: Secondary | ICD-10-CM

## 2020-03-06 DIAGNOSIS — L539 Erythematous condition, unspecified: Secondary | ICD-10-CM | POA: Diagnosis present

## 2020-03-06 LAB — COMPREHENSIVE METABOLIC PANEL
ALT: 29 U/L (ref 0–44)
AST: 25 U/L (ref 15–41)
Albumin: 3.7 g/dL (ref 3.5–5.0)
Alkaline Phosphatase: 76 U/L (ref 38–126)
Anion gap: 8 (ref 5–15)
BUN: 14 mg/dL (ref 6–20)
CO2: 27 mmol/L (ref 22–32)
Calcium: 8.9 mg/dL (ref 8.9–10.3)
Chloride: 104 mmol/L (ref 98–111)
Creatinine, Ser: 1.2 mg/dL (ref 0.61–1.24)
GFR calc Af Amer: >60 mL/min (ref >60)
GFR calc non Af Amer: >60 mL/min (ref >60)
Glucose, Bld: 214 mg/dL — ABNORMAL HIGH (ref 70–99)
Potassium: 4.1 mmol/L (ref 3.5–5.1)
Sodium: 139 mmol/L (ref 135–145)
Total Bilirubin: 0.6 mg/dL (ref 0.3–1.2)
Total Protein: 7.2 g/dL (ref 6.5–8.1)

## 2020-03-06 LAB — CBC WITH DIFFERENTIAL/PLATELET
Abs Immature Granulocytes: 0.06 10*3/uL (ref 0.00–0.07)
Basophils Absolute: 0.1 10*3/uL (ref 0.0–0.1)
Basophils Relative: 1 %
Eosinophils Absolute: 0.5 10*3/uL (ref 0.0–0.5)
Eosinophils Relative: 4 %
HCT: 44.2 % (ref 39.0–52.0)
Hemoglobin: 14.9 g/dL (ref 13.0–17.0)
Immature Granulocytes: 1 %
Lymphocytes Relative: 39 %
Lymphs Abs: 4.5 10*3/uL — ABNORMAL HIGH (ref 0.7–4.0)
MCH: 31.4 pg (ref 26.0–34.0)
MCHC: 33.7 g/dL (ref 30.0–36.0)
MCV: 93.1 fL (ref 80.0–100.0)
Monocytes Absolute: 0.6 10*3/uL (ref 0.1–1.0)
Monocytes Relative: 5 %
Neutro Abs: 5.8 10*3/uL (ref 1.7–7.7)
Neutrophils Relative %: 50 %
Platelets: 208 10*3/uL (ref 150–400)
RBC: 4.75 MIL/uL (ref 4.22–5.81)
RDW: 12.9 % (ref 11.5–15.5)
Smear Review: NORMAL
WBC: 11.6 10*3/uL — ABNORMAL HIGH (ref 4.0–10.5)
nRBC: 0 % (ref 0.0–0.2)

## 2020-03-06 LAB — FIBRIN DERIVATIVES D-DIMER (ARMC ONLY): Fibrin derivatives D-dimer (ARMC): 530.63 ng/mL (FEU) — ABNORMAL HIGH (ref 0.00–499.00)

## 2020-03-06 LAB — APTT: aPTT: 26 seconds (ref 24–36)

## 2020-03-06 LAB — TROPONIN I (HIGH SENSITIVITY): Troponin I (High Sensitivity): 6 ng/L (ref <18)

## 2020-03-06 MED ORDER — IOHEXOL 350 MG/ML SOLN
100.0000 mL | Freq: Once | INTRAVENOUS | Status: AC | PRN
  Administered 2020-03-06: 100 mL via INTRAVENOUS
  Filled 2020-03-06: qty 100

## 2020-03-06 MED ORDER — APIXABAN 5 MG PO TABS: ORAL_TABLET | ORAL | 6 refills | Status: DC

## 2020-03-06 NOTE — ED Provider Notes (Signed)
Emergency Department Provider Note  ____________________________________________  Time seen: Approximately 10:46 PM  I have reviewed the triage vital signs and the nursing notes.   HISTORY  Chief Complaint Shortness of Breath   Historian Patient    HPI Ricardo Wood is a 39 y.o. male presents to the emergency department with concern for new region of left calf erythema.  Patient states that he has a history of prior DVT and became concerned.  He is also noted some mild shortness of breath with prolonged ambulation.  No current chest pain or chest tightness.  He denies recent prolonged immobilization or daily smoking.  He does have a history of obesity.  No recent surgery.  Patient states that he was prescribed a blood thinner in the past but stopped taking it due to cost.  Patient states that he is now secured insurance and should not have a problem obtaining prescription for anticoagulant if needed.  Past Medical History:  Diagnosis Date  . DVT (deep venous thrombosis) ([place])   . Kidney stones   . Seizures ([place])      Immunizations up to date:  Yes.     Past Medical History:  Diagnosis Date  . DVT (deep venous thrombosis) ([place])   . Kidney stones   . Seizures [facility])     Patient Active Problem List   Diagnosis Date Noted  . Chest pain 07/25/2019    Past Surgical History:  Procedure Laterality Date  . dvt filter      Prior to Admission medications   Medication Sig Start Date End Date Taking? Authorizing Provider  amoxicillin (AMOXIL) 500 MG capsule Take 1 capsule (500 mg total) by mouth 3 (three) times daily. 01/31/20   Triplett, Johnette Abraham B, FNP  apixaban (ELIQUIS) 5 MG TABS tablet Take 10 mg twice daily for the next seven days. Then take 5 mg twice daily for the next six months. 03/06/20   Lannie Fields, PA-C  hydrocortisone 2.5 % lotion Apply topically 2 (two) times daily. 01/31/20   Triplett, Johnette Abraham B, FNP  hydrOXYzine (ATARAX/VISTARIL) 25 MG tablet Take 1 tablet (25  mg total) by mouth 3 (three) times daily as needed for itching. 01/31/20   Triplett, Cari B, FNP  lidocaine (XYLOCAINE) 2 % solution Use as directed 10 mLs in the mouth or throat as needed. 10/06/19   Laban Emperor, PA-C  magic mouthwash w/lidocaine SOLN Take 10 mLs by mouth 4 (four) times daily as needed for mouth pain. 01/31/20   Victorino Dike, FNP    Allergies Patient has no known allergies.  No family history on file.  Social History Social History   Tobacco Use  . Smoking status: Former Research scientist (life sciences)  . Smokeless tobacco: Never Used  Substance Use Topics  . Alcohol use: No  . Drug use: Yes    Types: Marijuana    Comment: last smoked about a week ago     Review of Systems  Constitutional: No fever/chills Eyes:  No discharge ENT: No upper respiratory complaints. Respiratory: no cough. No SOB/ use of accessory muscles to breath. Patient has mild shortness of breath.  Gastrointestinal:   No nausea, no vomiting.  No diarrhea.  No constipation. Musculoskeletal: Negative for musculoskeletal pain. Skin: Patient has erythema of left calf.  ____________________________________________   PHYSICAL EXAM:  VITAL SIGNS: ED Triage Vitals  Enc Vitals Group     BP 03/06/20 1909 (!) 154/99     Pulse Rate 03/06/20 1909 95     Resp 03/06/20  1909 18     Temp 03/06/20 1909 99 F (37.2 C)     Temp Source 03/06/20 1909 Oral     SpO2 03/06/20 1909 97 %     Weight 03/06/20 1913 (!) 320 lb (145.2 kg)     Height 03/06/20 1913 6' (1.829 m)     Head Circumference --      Peak Flow --      Pain Score 03/06/20 1910 4     Pain Loc --      Pain Edu? --      Excl. in [place]? --      Constitutional: Alert and oriented. Well appearing and in no acute distress. Eyes: Conjunctivae are normal. PERRL. EOMI. Head: Atraumatic. ENT:      Nose: No congestion/rhinnorhea.      Mouth/Throat: Mucous membranes are moist.  Neck: No stridor.  No cervical spine tenderness to palpation.  Cardiovascular: Normal  rate, regular rhythm. Normal S1 and S2.  Good peripheral circulation. Respiratory: Normal respiratory effort without tachypnea or retractions. Lungs CTAB. Good air entry to the bases with no decreased or absent breath sounds Gastrointestinal: Bowel sounds x 4 quadrants. Soft and nontender to palpation. No guarding or rigidity. No distention. Musculoskeletal: Full range of motion to all extremities. No obvious deformities noted Neurologic:  Normal for age. No gross focal neurologic deficits are appreciated.  Skin: Patient has a 3 cm x 3 cm region of erythema along left calf with 1+ pitting edema. Psychiatric: Mood and affect are normal for age. Speech and behavior are normal.   ____________________________________________   LABS (all labs ordered are listed, but only abnormal results are displayed)  Labs Reviewed  CBC WITH DIFFERENTIAL/PLATELET - Abnormal; Notable for the following components:      Result Value   WBC 11.6 (*)    Lymphs Abs 4.5 (*)    All other components within normal limits  COMPREHENSIVE METABOLIC PANEL - Abnormal; Notable for the following components:   Glucose, Bld 214 (*)    All other components within normal limits  FIBRIN DERIVATIVES D-DIMER ([facility] ONLY) - Abnormal; Notable for the following components:   Fibrin derivatives D-dimer ([facility]) 530.63 (*)    All other components within normal limits  APTT  TROPONIN I (HIGH SENSITIVITY)  TROPONIN I (HIGH SENSITIVITY)   ____________________________________________  EKG   ____________________________________________  RADIOLOGY Geraldo Pitter, personally viewed and evaluated these images (plain radiographs) as part of my medical decision making, as well as reviewing the written report by the radiologist.  CT Angio Chest PE W and/or Wo Contrast  Result Date: 03/06/2020 CLINICAL DATA:  Shortness of breath. EXAM: CT ANGIOGRAPHY CHEST WITH CONTRAST TECHNIQUE: Multidetector CT imaging of the chest was performed using  the standard protocol during bolus administration of intravenous contrast. Multiplanar CT image reconstructions and MIPs were obtained to evaluate the vascular anatomy. CONTRAST:  OMNIPAQUE IOHEXOL 350 MG/ML SOLN COMPARISON:  None. 07/25/2019. FINDINGS: Cardiovascular: There is no evidence for an acute pulmonary embolism, however evaluation is limited by respiratory motion artifact and suboptimal contrast bolus timing. The heart size is normal. There is no significant pericardial effusion. No evidence for thoracic aortic dissection or aneurysm. Mediastinum/Nodes: No enlarged mediastinal, hilar, or axillary lymph nodes. Thyroid gland, trachea, and esophagus demonstrate no significant findings. Lungs/Pleura: Lungs are clear. No pleural effusion or pneumothorax. Upper Abdomen: There is hepatic steatosis. Musculoskeletal: No chest wall abnormality. No acute or significant osseous findings. Review of the MIP images confirms the above findings.  IMPRESSION: 1. No evidence for an acute pulmonary embolism, however evaluation is limited by respiratory motion artifact and suboptimal contrast bolus timing. 2. No acute intrathoracic process. 3. Hepatic steatosis. Electronically Signed   By: Katherine Mantle M.D.   On: 03/06/2020 21:57   US Venous Img Lower Unilateral Left  Addendum Date: 03/06/2020   ADDENDUM REPORT: 03/06/2020 20:29 ADDENDUM: Study discussed by telephone with Dr. Fuller Plan in the ED on 03/06/2020 at 2022 hours. Electronically Signed   By: Odessa Fleming M.D.   On: 03/06/2020 20:29   Result Date: 03/06/2020 CLINICAL DATA:  39 year old male with left leg pain for 2 days. EXAM: LEFT LOWER EXTREMITY VENOUS DOPPLER ULTRASOUND TECHNIQUE: Gray-scale sonography with compression, as well as color and duplex ultrasound, were performed to evaluate the deep venous system(s) from the level of the common femoral vein through the popliteal and proximal calf veins. COMPARISON:  Lower extremity Doppler 06/20/2012. FINDINGS:  VENOUS The left common femoral vein, junction with the greater saphenous vein, left femoral vein, left profundus femoral vein, and visible deep calf veins demonstrate preserved compressibility, color Doppler flow and spectral Doppler augmentation. However, there is echogenic thrombus in the left popliteal vein which is incompletely compressible (images 25 and 26) with partial preservation of flow. Furthermore, there are thrombosed superficial veins demonstrated in the calf on images 37 through 39. Limited views of the contralateral common femoral vein are unremarkable. OTHER None. Limitations: none IMPRESSION: 1. Positive for nonocclusive thrombus of the left popliteal vein, and superimposed left calf superficial venous thrombophlebitis. 2. No other left lower extremity DVT. Electronically Signed: By: Odessa Fleming M.D. On: 03/06/2020 20:16    ____________________________________________    PROCEDURES  Procedure(s) performed:     Procedures     Medications  iohexol (OMNIPAQUE) 350 MG/ML injection 100 mL (100 mLs Intravenous Contrast Given 03/06/20 2151)     ____________________________________________   INITIAL IMPRESSION / ASSESSMENT AND PLAN / ED COURSE  Pertinent labs & imaging results that were available during my care of the patient were reviewed by me and considered in my medical decision making (see chart for details).      Assessment and plan DVT 38 year old male presents to the emergency department with concern for erythema of the left lower extremity.  He was hypertensive at triage but vital signs were otherwise reassuring.  He was satting at 100% on room air.  There is no increased work of breathing on exam.  He had a small region of erythema along the left lower extremity with 1+ pitting edema.  Venous ultrasound was concerning for a nonocclusive thrombus of the left popliteal vein.  CTA revealed no evidence of PE.  Patient was started on Eliquis.  He was advised to  follow-up with his primary care provider and inform her/him of new diagnosis of DVT.  Return precautions were given to return with new or worsening symptoms. ____________________________________________  FINAL CLINICAL IMPRESSION(S) / ED DIAGNOSES  Final diagnoses:  Acute deep vein thrombosis (DVT) of distal vein of left lower extremity ([facility])      NEW MEDICATIONS STARTED DURING THIS VISIT:  ED Discharge Orders         Ordered    apixaban (ELIQUIS) 5 MG TABS tablet     03/06/20 2234              This chart was dictated using voice recognition software/Dragon. Despite best efforts to proofread, errors can occur which can change the meaning. Any change was purely unintentional.  Pia Mau Uriah, PA-C 03/06/20 2251    Concha Se, MD 03/10/20 1535

## 2020-03-06 NOTE — Discharge Instructions (Signed)
Start Eliquis by taking 10 mg twice daily for 1 week.  You can then take 5 mg twice daily for the next 6 months.  I would like for you to follow-up with primary care and inform her of your diagnosis of DVT.

## 2020-03-06 NOTE — ED Triage Notes (Signed)
Pt to the er for intermittent SOB with ambulation. Pt is more concerned with left leg knot on the inner aspect of the calf. Pt reports pain. Pt has a hx of DVT. Pt stopped taking blood thinners due to money.

## 2020-06-17 ENCOUNTER — Emergency Department: Payer: Medicare Other

## 2020-06-17 ENCOUNTER — Other Ambulatory Visit: Payer: Self-pay

## 2020-06-17 ENCOUNTER — Emergency Department
Admission: EM | Admit: 2020-06-17 | Discharge: 2020-06-17 | Disposition: A | Discharge status: Home / Self Care | Payer: Medicare Other | Attending: Emergency Medicine | Admitting: Emergency Medicine

## 2020-06-17 ENCOUNTER — Emergency Department
Admission: EM | Admit: 2020-06-17 | Discharge: 2020-06-18 | Disposition: A | Discharge status: Home / Self Care | Payer: Medicare Other | Source: Home / Self Care | Attending: Emergency Medicine | Admitting: Emergency Medicine

## 2020-06-17 DIAGNOSIS — Z5321 Procedure and treatment not carried out due to patient leaving prior to being seen by health care provider: Secondary | ICD-10-CM | POA: Insufficient documentation

## 2020-06-17 DIAGNOSIS — Z87891 Personal history of nicotine dependence: Secondary | ICD-10-CM | POA: Insufficient documentation

## 2020-06-17 DIAGNOSIS — R739 Hyperglycemia, unspecified: Secondary | ICD-10-CM | POA: Insufficient documentation

## 2020-06-17 DIAGNOSIS — M549 Dorsalgia, unspecified: Secondary | ICD-10-CM | POA: Diagnosis present

## 2020-06-17 DIAGNOSIS — I824Y1 Acute embolism and thrombosis of unspecified deep veins of right proximal lower extremity: Secondary | ICD-10-CM | POA: Insufficient documentation

## 2020-06-17 DIAGNOSIS — M545 Low back pain, unspecified: Secondary | ICD-10-CM

## 2020-06-17 DIAGNOSIS — M79604 Pain in right leg: Secondary | ICD-10-CM | POA: Diagnosis not present

## 2020-06-17 LAB — BASIC METABOLIC PANEL
Anion gap: 10 (ref 5–15)
BUN: 14 mg/dL (ref 6–20)
CO2: 25 mmol/L (ref 22–32)
Calcium: 8.9 mg/dL (ref 8.9–10.3)
Chloride: 100 mmol/L (ref 98–111)
Creatinine, Ser: 1.19 mg/dL (ref 0.61–1.24)
GFR calc Af Amer: >60 mL/min (ref >60)
GFR calc non Af Amer: >60 mL/min (ref >60)
Glucose, Bld: 260 mg/dL — ABNORMAL HIGH (ref 70–99)
Potassium: 3.9 mmol/L (ref 3.5–5.1)
Sodium: 135 mmol/L (ref 135–145)

## 2020-06-17 MED ORDER — RIVAROXABAN (XARELTO) VTE STARTER PACK (15 & 20 MG): ORAL_TABLET | ORAL | 0 refills | Status: DC

## 2020-06-17 MED ORDER — NAPROXEN 500 MG PO TABS
500.0000 mg | ORAL_TABLET | Freq: Once | ORAL | Status: AC
  Administered 2020-06-17: 500 mg via ORAL
  Filled 2020-06-17: qty 1

## 2020-06-17 MED ORDER — ACETAMINOPHEN 500 MG PO TABS
1000.0000 mg | ORAL_TABLET | Freq: Once | ORAL | Status: AC
  Administered 2020-06-17: 1000 mg via ORAL
  Filled 2020-06-17: qty 2

## 2020-06-17 NOTE — ED Notes (Signed)
Called pt on cell phone and encouraged him to come back to ED due to abnormal u/s. verbalized understanding.

## 2020-06-17 NOTE — ED Provider Notes (Signed)
[facility] Emergency Department Provider Note  ____________________________________________   First MD Initiated Contact with Patient 06/17/20 2332     (approximate)  I have reviewed the triage vital signs and the nursing notes.   HISTORY  Chief Complaint DVT   HPI Ricardo Wood is a 39 y.o. male with a past medical history of seizure disorder, renal disorder, kidney stones, asthma, and recurrent DVTs status post DVT filter recently discontinued Eliquis due to side effects who presents for assessment of lower back pain and right posterior calf pain that began after a fall earlier today.  Patient states he was lifting heavy object when he fell onto his lower back.  He states he merely had some pain in his lower back and back of his right leg.  Denies striking his head or any other acute pain including in his right hip, right knee, left lower extremity, upper extremities, chest, abdomen, or upper back.  Did not strike his head and denies LOC.  States he was previously on Coumadin as well as Lovenox but discontinued these because he did not like the side effects of Coumadin did not like inject himself twice a day with Lovenox.  He states he discontinued the Eliquis because of multitude of side effects and has not yet restarted on another anticoagulant.  He does endorse tobacco abuse but denies EtOH or illicit drug use.  No other acute concerns at this time.         Past Medical History:  Diagnosis Date  . DVT (deep venous thrombosis) (HCC)   . Kidney stones   . Seizures [facility])     Patient Active Problem List   Diagnosis Date Noted  . Chest pain 07/25/2019    Past Surgical History:  Procedure Laterality Date  . dvt filter      Prior to Admission medications   Medication Sig Start Date End Date Taking? Authorizing Provider  amoxicillin (AMOXIL) 500 MG capsule Take 1 capsule (500 mg total) by mouth 3 (three) times daily. 01/31/20   Triplett, Cari B, FNP   hydrocortisone 2.5 % lotion Apply topically 2 (two) times daily. 01/31/20   Triplett, Rulon Eisenmenger B, FNP  hydrOXYzine (ATARAX/VISTARIL) 25 MG tablet Take 1 tablet (25 mg total) by mouth 3 (three) times daily as needed for itching. 01/31/20   Triplett, Cari B, FNP  lidocaine (XYLOCAINE) 2 % solution Use as directed 10 mLs in the mouth or throat as needed. 10/06/19   Enid Derry, PA-C  magic mouthwash w/lidocaine SOLN Take 10 mLs by mouth 4 (four) times daily as needed for mouth pain. 01/31/20   Triplett, Kasandra Knudsen, FNP  RIVAROXABAN (XARELTO) VTE STARTER PACK (15 & 20 MG TABLETS) Follow package directions: Take one 15mg  tablet by mouth twice a day. On day 22, switch to one 20mg  tablet once a day. Take with food. 06/17/20   , MD    Allergies Patient has no known allergies.  No family history on file.  Social History Social History   Tobacco Use  . Smoking status: Former 06/19/20  . Smokeless tobacco: Never Used  Substance Use Topics  . Alcohol use: No  . Drug use: Yes    Types: Marijuana    Comment: last smoked about a week ago    Review of Systems  Review of Systems  Constitutional: Negative for chills and fever.  HENT: Negative for sore throat.   Eyes: Negative for pain.  Respiratory: Negative for cough and stridor.  Cardiovascular: Negative for chest pain.  Gastrointestinal: Negative for vomiting.  Genitourinary: Negative for dysuria.  Musculoskeletal: Positive for back pain and myalgias ( back of R lower leg).  Skin: Negative for rash.  Neurological: Negative for seizures, loss of consciousness and headaches.  Psychiatric/Behavioral: Negative for suicidal ideas.  All other systems reviewed and are negative.     ____________________________________________   PHYSICAL EXAM:  VITAL SIGNS: ED Triage Vitals  Enc Vitals Group     BP 06/17/20 2315 140/89     Pulse Rate 06/17/20 2315 (!) 110     Resp 06/17/20 2315 18     Temp 06/17/20 2315 99.6 F (37.6 C)     Temp  Source 06/17/20 2315 Oral     SpO2 06/17/20 2315 97 %     Weight 06/17/20 2333 (!) 320 lb (145.2 kg)     Height 06/17/20 2333 6' (1.829 m)     Head Circumference --      Peak Flow --      Pain Score 06/17/20 2332 4     Pain Loc --      Pain Edu? --      Excl. in GC? --    Vitals:   06/17/20 2315  BP: 140/89  Pulse: (!) 110  Resp: 18  Temp: 99.6 F (37.6 C)  SpO2: 97%   Physical Exam Vitals and nursing note reviewed.  Constitutional:      Appearance: He is well-developed. He is obese.  HENT:     Head: Normocephalic and atraumatic.     Right Ear: External ear normal.     Left Ear: External ear normal.     Nose: Nose normal.     Mouth/Throat:     Mouth: Mucous membranes are moist.  Eyes:     Conjunctiva/sclera: Conjunctivae normal.  Cardiovascular:     Rate and Rhythm: Normal rate and regular rhythm.     Heart sounds: No murmur heard.   Pulmonary:     Effort: Pulmonary effort is normal. No respiratory distress.     Breath sounds: Normal breath sounds.  Abdominal:     Palpations: Abdomen is soft.     Tenderness: There is no abdominal tenderness.  Musculoskeletal:     Cervical back: Neck supple.  Skin:    General: Skin is warm and dry.     Capillary Refill: Capillary refill takes less than 2 seconds.  Neurological:     Mental Status: He is alert and oriented to person, place, and time.  Psychiatric:        Mood and Affect: Mood normal.     No tenderness over the C or T-spine but there is some tenderness over the L-spine as well as the bilateral paralumbar muscles.  2+ bilateral radial and DP pulses.  Patient has full strength in his bilateral lower extremities.  He does have some subtle edema and tenderness over the posterior right calf.  Remainder of bilateral lower extremities are unremarkable without other evidence of trauma. ____________________________________________   LABS (all labs ordered are listed, but only abnormal results are displayed)  Labs  Reviewed  BASIC METABOLIC PANEL - Abnormal; Notable for the following components:      Result Value   Glucose, Bld 260 (*)    All other components within normal limits  CBC WITH DIFFERENTIAL/PLATELET - Abnormal; Notable for the following components:   WBC 13.0 (*)    Platelets 146 (*)    Lymphs Abs 4.3 (*)    Eosinophils  Absolute 0.8 (*)    All other components within normal limits  PROTIME-INR  APTT   ____________________________________________ ____________________________________________  RADIOLOGY  ED MD interpretation: Ultrasound right lower extremity positive for DVT.  Official radiology report(s): DG Lumbar Spine 2-3 Views  Result Date: 06/17/2020 CLINICAL DATA:  Back pain. EXAM: LUMBAR SPINE - 2-3 VIEW COMPARISON:  Feb 25, 2012 FINDINGS: There is no evidence of an acute lumbar spine fracture. A chronic deformity is seen along the anterior aspect of the inferior endplate of the T12 vertebral body. Alignment is normal. Intervertebral disc spaces are maintained. An inferior vena cava filter is noted. IMPRESSION: 1. No evidence of acute lumbar spine fracture or subluxation. 2. Inferior vena cava filter. 3. Chronic deformity involving the inferior endplate of the T12 vertebral body which may represent a Schmorl's node Electronically Signed   By: Aram Candela M.D.   On: 06/17/2020 23:58   US Venous Img Lower Unilateral Right  Result Date: 06/17/2020 CLINICAL DATA:  Initial evaluation for acute leg pressure, pain. History of prior DVT. EXAM: RIGHT LOWER EXTREMITY VENOUS DOPPLER ULTRASOUND TECHNIQUE: Gray-scale sonography with graded compression, as well as color Doppler and duplex ultrasound were performed to evaluate the lower extremity deep venous systems from the level of the common femoral vein and including the common femoral, femoral, profunda femoral, popliteal and calf veins including the posterior tibial, peroneal and gastrocnemius veins when visible. The superficial great  saphenous vein was also interrogated. Spectral Doppler was utilized to evaluate flow at rest and with distal augmentation maneuvers in the common femoral, femoral and popliteal veins. COMPARISON:  None. FINDINGS: Contralateral Common Femoral Vein: Respiratory phasicity is normal and symmetric with the symptomatic side. No evidence of thrombus. Normal compressibility. Common Femoral Vein: No evidence of thrombus. Normal compressibility, respiratory phasicity and response to augmentation. Saphenofemoral Junction: No evidence of thrombus. Normal compressibility and flow on color Doppler imaging. Profunda Femoral Vein: No evidence of thrombus. Normal compressibility and flow on color Doppler imaging. Femoral Vein: Echogenic thrombus seen within the distal right femoral vein with near occlusion. Popliteal Vein: Occlusive echogenic thrombus seen throughout the right popliteal vein. Calf Veins: Occlusive echogenic thrombus seen within the right peroneal vein. The right posterior tibial vein appears patent. Superficial Great Saphenous Vein: No evidence of thrombus. Normal compressibility. Venous Reflux:  None. Other Findings:  None. IMPRESSION: Positive study with occlusive DVT extending from the distal right femoral vein through the popliteal vein, and into the right calf. Electronically Signed   By: Rise Mu M.D.   On: 06/17/2020 18:54    ____________________________________________   PROCEDURES  Procedure(s) performed (including Critical Care):  Procedures   ____________________________________________   INITIAL IMPRESSION / ASSESSMENT AND PLAN / ED COURSE        Patient presents with [place] to history exam for assessment of lower back pain as well as right posterior calf pain after a mechanical ground-level fall that occurred earlier today.  Patient is slightly tachycardic otherwise stable vital signs on room air.  Exam as above.  Ultrasound obtained in triage is positive for right lower  extremity DVT.  Given acute onset of pain as well as possible contusion associated with ground-level fall is possible this is likely acute DVT.  X-ray of L-spine shows no evidence of acute fracture impression is contusion.  Patient otherwise has no chest pain or shortness of breath or other symptoms to suggest ACS, PE, acute infectious process, or significant metabolic derangement.  Screening labs were sent which does show hyperkalemia  although patient notes he did have some ice cream before coming to the ED.  I advised patient in addition to following up with his PCP for his diagnosis of DVT and reinitiation of anticoagulation he should follow-up to have his blood sugar rechecked as it could be he is a diabetic.  Patient voiced understanding and agreement this plan.  Discharged stable condition.  Strict return precautions advised and discussed.  ____________________________________________   FINAL CLINICAL IMPRESSION(S) / ED DIAGNOSES  Final diagnoses:  Acute deep vein thrombosis (DVT) of proximal vein of right lower extremity (HCC)  Acute midline low back pain without sciatica  Hyperglycemia    Medications  Rivaroxaban (XARELTO) tablet 15 mg (has no administration in time range)  acetaminophen (TYLENOL) tablet 1,000 mg (1,000 mg Oral Given 06/17/20 2354)  naproxen (NAPROSYN) tablet 500 mg (500 mg Oral Given 06/17/20 2355)     ED Discharge Orders         Ordered    RIVAROXABAN (XARELTO) VTE STARTER PACK (15 & 20 MG TABLETS)        06/17/20 2348           Note:  This document was prepared using Dragon voice recognition software and may include unintentional dictation errors.   Gilles Chiquito, MD 06/18/20 [phone]

## 2020-06-17 NOTE — ED Notes (Signed)
Called pt to be taken to exam room. Unable to locate pt at this time.

## 2020-06-17 NOTE — ED Notes (Signed)
EDP Smith at bedside  

## 2020-06-17 NOTE — ED Triage Notes (Signed)
See triage note from earlier. PT left before being seen, called by first nurse to come back d/t abnormal [place]. PT still c/o back pain and leg pain and swelling.

## 2020-06-17 NOTE — ED Notes (Signed)
Pt reports pain in right leg and back pain.  Pt fell today moving a cast iron tub.  Hx dvt.  No blood thinners at this time.  Nonsmoker   No chest pain or sob.  Iv started, labs sent   meds given

## 2020-06-17 NOTE — ED Triage Notes (Signed)
Pt to ED via POV c/o back pain and leg pain after stepping wrong while moving a cast iron tub. PT has hx of sciatica and also DVT's in both legs, pt states R leg feels like pressure, first noticed it last night and got worse with incident today. PT is switching blood thinners this week.

## 2020-06-17 NOTE — ED Notes (Signed)
Pt to xray

## 2020-06-18 DIAGNOSIS — M549 Dorsalgia, unspecified: Secondary | ICD-10-CM | POA: Diagnosis not present

## 2020-06-18 LAB — CBC WITH DIFFERENTIAL/PLATELET
Abs Immature Granulocytes: 0.06 10*3/uL (ref 0.00–0.07)
Basophils Absolute: 0.1 10*3/uL (ref 0.0–0.1)
Basophils Relative: 1 %
Eosinophils Absolute: 0.8 10*3/uL — ABNORMAL HIGH (ref 0.0–0.5)
Eosinophils Relative: 6 %
HCT: 41.4 % (ref 39.0–52.0)
Hemoglobin: 13.8 g/dL (ref 13.0–17.0)
Immature Granulocytes: 1 %
Lymphocytes Relative: 33 %
Lymphs Abs: 4.3 10*3/uL — ABNORMAL HIGH (ref 0.7–4.0)
MCH: 31.7 pg (ref 26.0–34.0)
MCHC: 33.3 g/dL (ref 30.0–36.0)
MCV: 95 fL (ref 80.0–100.0)
Monocytes Absolute: 0.8 10*3/uL (ref 0.1–1.0)
Monocytes Relative: 6 %
Neutro Abs: 7.1 10*3/uL (ref 1.7–7.7)
Neutrophils Relative %: 53 %
Platelets: 146 10*3/uL — ABNORMAL LOW (ref 150–400)
RBC: 4.36 MIL/uL (ref 4.22–5.81)
RDW: 13.5 % (ref 11.5–15.5)
WBC: 13 10*3/uL — ABNORMAL HIGH (ref 4.0–10.5)
nRBC: 0.2 % (ref 0.0–0.2)

## 2020-06-18 LAB — APTT: aPTT: 28 seconds (ref 24–36)

## 2020-06-18 LAB — PROTIME-INR
INR: 1 (ref 0.8–1.2)
Prothrombin Time: 12.8 seconds (ref 11.4–15.2)

## 2020-06-18 MED ORDER — RIVAROXABAN 15 MG PO TABS
15.0000 mg | ORAL_TABLET | Freq: Once | ORAL | Status: AC
  Administered 2020-06-18: 15 mg via ORAL
  Filled 2020-06-18: qty 1

## 2020-08-16 ENCOUNTER — Other Ambulatory Visit (INDEPENDENT_AMBULATORY_CARE_PROVIDER_SITE_OTHER): Payer: Self-pay | Admitting: Vascular Surgery

## 2020-08-16 DIAGNOSIS — Z86718 Personal history of other venous thrombosis and embolism: Secondary | ICD-10-CM

## 2020-08-16 DIAGNOSIS — I82509 Chronic embolism and thrombosis of unspecified deep veins of unspecified lower extremity: Secondary | ICD-10-CM

## 2020-08-19 ENCOUNTER — Ambulatory Visit (INDEPENDENT_AMBULATORY_CARE_PROVIDER_SITE_OTHER): Payer: Self-pay | Admitting: Vascular Surgery

## 2020-08-19 ENCOUNTER — Encounter (INDEPENDENT_AMBULATORY_CARE_PROVIDER_SITE_OTHER): Payer: Self-pay

## 2020-10-14 DIAGNOSIS — I82509 Chronic embolism and thrombosis of unspecified deep veins of unspecified lower extremity: Secondary | ICD-10-CM | POA: Insufficient documentation

## 2020-10-14 NOTE — Progress Notes (Signed)
MRN : [id number]  Ricardo Wood is a 40 y.o. (10-Apr-1981) male who presents with chief complaint of blood clot in my leg.  History of Present Illness:   The patient presents to the office for evaluation of DVT.  DVT was identified at [facility] by Duplex ultrasound in September 2021 but he has a long term history of DVT. The initial symptoms were pain and swelling in the lower extremity.  The patient notes the leg continues to be somewhat painful with dependency and swells quite a bite.  The amount of swelling by the end of the day is his biggest concern.  Symptoms are much better with elevation.  The patient notes minimal edema in the morning which steadily worsens throughout the day.    The patient has not been using compression therapy at this point.  No SOB or pleuritic chest pains.  No cough or hemoptysis.  No blood per rectum or blood in any sputum.  No excessive bruising per the patient.   Duplex ultrasound shows chronic nonocclusive changes in the right popliteal vein  No outpatient medications have been marked as taking for the 10/17/20 encounter (Appointment) with Gilda Crease, Latina Craver, MD.    Past Medical History:  Diagnosis Date   DVT (deep venous thrombosis) (HCC)    Kidney stones    Seizures (HCC)     Past Surgical History:  Procedure Laterality Date   dvt filter      Social History Social History   Tobacco Use   Smoking status: Former Smoker   Smokeless tobacco: Never Used  Substance Use Topics   Alcohol use: No   Drug use: Yes    Types: Marijuana    Comment: last smoked about a week ago    Family History No family history of bleeding/clotting disorders, porphyria or autoimmune disease   No Known Allergies   REVIEW OF SYSTEMS (Negative unless checked)  Constitutional: [] Weight loss  [] Fever  [] Chills Cardiac: [] Chest pain   [] Chest pressure   [] Palpitations   [] Shortness of breath when laying flat   [] Shortness of breath with  exertion. Vascular:  [] Pain in legs with walking   [x] Pain in legs at rest  [x] History of DVT   [] Phlebitis   [x] Swelling in legs   [] Varicose veins   [] Non-healing ulcers Pulmonary:   [] Uses home oxygen   [] Productive cough   [] Hemoptysis   [] Wheeze  [] COPD   [] Asthma Neurologic:  [] Dizziness   [] Seizures   [] History of stroke   [] History of TIA  [] Aphasia   [] Vissual changes   [] Weakness or numbness in arm   [] Weakness or numbness in leg Musculoskeletal:   [] Joint swelling   [] Joint pain   [] Low back pain Hematologic:  [] Easy bruising  [] Easy bleeding   [] Hypercoagulable state   [] Anemic Gastrointestinal:  [] Diarrhea   [] Vomiting  [] Gastroesophageal reflux/heartburn   [] Difficulty swallowing. Genitourinary:  [] Chronic kidney disease   [] Difficult urination  [] Frequent urination   [] Blood in urine Skin:  [] Rashes   [] Ulcers  Psychological:  [] History of anxiety   []  History of major depression.  Physical Examination  There were no vitals filed for this visit. There is no height or weight on file to calculate BMI. Gen: WD/WN, NAD Head: [place]/AT, No temporalis wasting.  Ear/Nose/Throat: Hearing grossly intact, nares w/o erythema or drainage, poor dentition Eyes: PER, EOMI, sclera nonicteric.  Neck: Supple, no masses.  No bruit or JVD.  Pulmonary:  Good air movement, clear to auscultation bilaterally, no use  of accessory muscles.  Cardiac: RRR, normal S1, S2, no Murmurs. Vascular: scattered varicosities present bilaterally.  Mild venous stasis changes to the legs bilaterally.  2-3+ soft pitting edema right >> left Vessel Right Left  Radial Palpable Palpable  PT Palpable Palpable  DP Palpable Palpable  Gastrointestinal: soft, non-distended. No guarding/no peritoneal signs.  Musculoskeletal: M/S 5/5 throughout.  No deformity or atrophy.  Neurologic: CN 2-12 intact. Pain and light touch intact in extremities.  Symmetrical.  Speech is fluent. Motor exam as listed above. Psychiatric: Judgment  intact, Mood & affect appropriate for pt's clinical situation. Dermatologic: Venous rashes no ulcers noted.  No changes consistent with cellulitis.   CBC Lab Results  Component Value Date   WBC 13.0 (H) 06/17/2020   HGB 13.8 06/17/2020   HCT 41.4 06/17/2020   MCV 95.0 06/17/2020   PLT 146 (L) 06/17/2020    BMET    Component Value Date/Time   NA 135 06/17/2020 2334   NA 138 10/19/2012 1051   K 3.9 06/17/2020 2334   K 4.2 10/19/2012 1051   CL 100 06/17/2020 2334   CL 106 10/19/2012 1051   CO2 25 06/17/2020 2334   CO2 27 10/19/2012 1051   GLUCOSE 260 (H) 06/17/2020 2334   GLUCOSE 127 (H) 10/19/2012 1051   BUN 14 06/17/2020 2334   BUN 14 10/19/2012 1051   CREATININE 1.19 06/17/2020 2334   CREATININE 1.30 10/19/2012 1051   CALCIUM 8.9 06/17/2020 2334   CALCIUM 8.7 10/19/2012 1051   GFRNONAA >60 06/17/2020 2334   GFRNONAA >60 10/19/2012 1051   GFRAA >60 06/17/2020 2334   GFRAA >60 10/19/2012 1051   CrCl cannot be calculated (Patient's most recent lab result is older than the maximum 21 days allowed.).  COAG Lab Results  Component Value Date   INR 1.0 06/17/2020   INR 1.1 10/19/2012   INR 0.9 06/20/2012    Radiology No results found.   Assessment/Plan 1. Chronic deep vein thrombosis (DVT) of proximal vein of lower extremity, unspecified laterality (HCC) Recommend:  No surgery or intervention at this point in time.    I have reviewed my previous discussion with the patient regarding swelling and why it causes symptoms.  Patient will continue wearing graduated compression stockings class 1 (20-30 mmHg) on a daily basis. The patient will  beginning wearing the stockings first thing in the morning and removing them in the evening. The patient is instructed specifically not to sleep in the stockings.    In addition, behavioral modification including several periods of elevation of the lower extremities during the day will be continued.  This was reviewed with the  patient during the initial visit.  The patient will also continue routine exercise, especially walking on a daily basis as was discussed during the initial visit.    I believe that a lymph pump should be added to improve the control of the patient's lymphedema and my final decision will be made after conservative therapy is assessed.  Additionally, a lymph pump is warranted because it will reduce the risk of cellulitis and ulceration in the future.  Patient should follow-up in 2-3 months      Levora Dredge, MD  10/14/2020 3:43 PM

## 2020-10-17 ENCOUNTER — Ambulatory Visit (INDEPENDENT_AMBULATORY_CARE_PROVIDER_SITE_OTHER): Payer: Medicare Other

## 2020-10-17 ENCOUNTER — Encounter (INDEPENDENT_AMBULATORY_CARE_PROVIDER_SITE_OTHER): Payer: Self-pay | Admitting: Vascular Surgery

## 2020-10-17 ENCOUNTER — Ambulatory Visit (INDEPENDENT_AMBULATORY_CARE_PROVIDER_SITE_OTHER): Payer: Medicare Other | Admitting: Vascular Surgery

## 2020-10-17 ENCOUNTER — Other Ambulatory Visit: Payer: Self-pay

## 2020-10-17 DIAGNOSIS — I825Y9 Chronic embolism and thrombosis of unspecified deep veins of unspecified proximal lower extremity: Secondary | ICD-10-CM | POA: Diagnosis not present

## 2020-10-17 DIAGNOSIS — Z86718 Personal history of other venous thrombosis and embolism: Secondary | ICD-10-CM | POA: Diagnosis not present

## 2020-10-17 DIAGNOSIS — I82509 Chronic embolism and thrombosis of unspecified deep veins of unspecified lower extremity: Secondary | ICD-10-CM

## 2020-12-11 DIAGNOSIS — J449 Chronic obstructive pulmonary disease, unspecified: Secondary | ICD-10-CM | POA: Insufficient documentation

## 2020-12-11 DIAGNOSIS — I890 Lymphedema, not elsewhere classified: Secondary | ICD-10-CM | POA: Insufficient documentation

## 2020-12-11 NOTE — Progress Notes (Deleted)
MRN : [id number]  Ricardo Wood is a 40 y.o. (19-Jun-1981) male who presents with chief complaint of No chief complaint on file. Marland Kitchen  History of Present Illness:   The patient presents to the office for follow up evaluation of DVT and leg swelling.  DVT was identified at [facility] by Duplex ultrasound in September 2021 but he has a long term history of DVT. The initial symptoms were pain and swelling in the lower extremity.  The patient notes the leg continues to be painful with dependency and swells quite a bite despite compression.  The amount of swelling by the end of the day is his biggest concern.  Symptoms are much better with elevation.  The patient notes minimal edema in the morning which steadily worsens throughout the day.    The patient has not been using compression therapy at this point.  No SOB or pleuritic chest pains.  No cough or hemoptysis.  No blood per rectum or blood in any sputum.  No excessive bruising per the patient.   Previous duplex ultrasound shows chronic nonocclusive changes in the right popliteal vein   No outpatient medications have been marked as taking for the 12/12/20 encounter (Appointment) with Gilda Crease, Latina Craver, MD.    Past Medical History:  Diagnosis Date  . DVT (deep venous thrombosis) (HCC)   . Kidney stones   . Seizures (HCC)     Past Surgical History:  Procedure Laterality Date  . dvt filter      Social History Social History   Tobacco Use  . Smoking status: Former Games developer  . Smokeless tobacco: Never Used  Substance Use Topics  . Alcohol use: No  . Drug use: Yes    Types: Marijuana    Comment: last smoked about a week ago    Family History No family history on file.  Allergies  Allergen Reactions  . Onion      REVIEW OF SYSTEMS (Negative unless checked)  Constitutional: [] Weight loss  [] Fever  [] Chills Cardiac: [] Chest pain   [] Chest pressure   [] Palpitations   [] Shortness of breath when laying flat    [] Shortness of breath with exertion. Vascular:  [] Pain in legs with walking   [] Pain in legs at rest  [x] History of DVT   [] Phlebitis   [x] Swelling in legs   [] Varicose veins   [] Non-healing ulcers Pulmonary:   [] Uses home oxygen   [] Productive cough   [] Hemoptysis   [] Wheeze  [] COPD   [] Asthma Neurologic:  [] Dizziness   [] Seizures   [] History of stroke   [] History of TIA  [] Aphasia   [] Vissual changes   [] Weakness or numbness in arm   [] Weakness or numbness in leg Musculoskeletal:   [] Joint swelling   [] Joint pain   [] Low back pain Hematologic:  [] Easy bruising  [] Easy bleeding   [] Hypercoagulable state   [] Anemic Gastrointestinal:  [] Diarrhea   [] Vomiting  [] Gastroesophageal reflux/heartburn   [] Difficulty swallowing. Genitourinary:  [] Chronic kidney disease   [] Difficult urination  [] Frequent urination   [] Blood in urine Skin:  [] Rashes   [] Ulcers  Psychological:  [] History of anxiety   []  History of major depression.  Physical Examination  There were no vitals filed for this visit. There is no height or weight on file to calculate BMI. Gen: WD/WN, NAD Head: [place]/AT, No temporalis wasting.  Ear/Nose/Throat: Hearing grossly intact, nares w/o erythema or drainage Eyes: PER, EOMI, sclera nonicteric.  Neck: Supple, no large masses.   Pulmonary:  Good air movement, no  audible wheezing bilaterally, no use of accessory muscles.  Cardiac: RRR, no JVD Vascular: scattered varicosities present bilaterally.  Mild venous stasis changes to the legs bilaterally.  3-4+ soft pitting edema Vessel Right Left  Radial Palpable Palpable  PT Palpable Palpable  DP Palpable Palpable  Gastrointestinal: Non-distended. No guarding/no peritoneal signs.  Musculoskeletal: M/S 5/5 throughout.  No deformity or atrophy.  Neurologic: CN 2-12 intact. Symmetrical.  Speech is fluent. Motor exam as listed above. Psychiatric: Judgment intact, Mood & affect appropriate for pt's clinical situation. Dermatologic:Venous rashes  no ulcers noted.  No changes consistent with cellulitis. Lymph : + lichenification / skin changes of chronic lymphedema.  CBC Lab Results  Component Value Date   WBC 13.0 (H) 06/17/2020   HGB 13.8 06/17/2020   HCT 41.4 06/17/2020   MCV 95.0 06/17/2020   PLT 146 (L) 06/17/2020    BMET    Component Value Date/Time   NA 135 06/17/2020 2334   NA 138 10/19/2012 1051   K 3.9 06/17/2020 2334   K 4.2 10/19/2012 1051   CL 100 06/17/2020 2334   CL 106 10/19/2012 1051   CO2 25 06/17/2020 2334   CO2 27 10/19/2012 1051   GLUCOSE 260 (H) 06/17/2020 2334   GLUCOSE 127 (H) 10/19/2012 1051   BUN 14 06/17/2020 2334   BUN 14 10/19/2012 1051   CREATININE 1.19 06/17/2020 2334   CREATININE 1.30 10/19/2012 1051   CALCIUM 8.9 06/17/2020 2334   CALCIUM 8.7 10/19/2012 1051   GFRNONAA >60 06/17/2020 2334   GFRNONAA >60 10/19/2012 1051   GFRAA >60 06/17/2020 2334   GFRAA >60 10/19/2012 1051   CrCl cannot be calculated (Patient's most recent lab result is older than the maximum 21 days allowed.).  COAG Lab Results  Component Value Date   INR 1.0 06/17/2020   INR 1.1 10/19/2012   INR 0.9 06/20/2012    Radiology No results found.   Assessment/Plan There are no diagnoses linked to this encounter.   Levora Dredge, MD  12/11/2020 9:12 PM

## 2020-12-12 ENCOUNTER — Ambulatory Visit (INDEPENDENT_AMBULATORY_CARE_PROVIDER_SITE_OTHER): Payer: Medicare Other | Admitting: Vascular Surgery

## 2020-12-12 DIAGNOSIS — I890 Lymphedema, not elsewhere classified: Secondary | ICD-10-CM

## 2020-12-12 DIAGNOSIS — I825Y9 Chronic embolism and thrombosis of unspecified deep veins of unspecified proximal lower extremity: Secondary | ICD-10-CM

## 2020-12-12 DIAGNOSIS — J449 Chronic obstructive pulmonary disease, unspecified: Secondary | ICD-10-CM

## 2021-05-02 IMAGING — CR DG CHEST 2V
1 series · 2 of 2 positions shown · non-contrast
Comparison: March 11, 2008

CLINICAL DATA: Shortness of breath

EXAM:
CHEST - 2 VIEW

[Series 1: dg chest 2 view · 0.14mm/px · 2 of 2 slices shown]
[im 1/2]
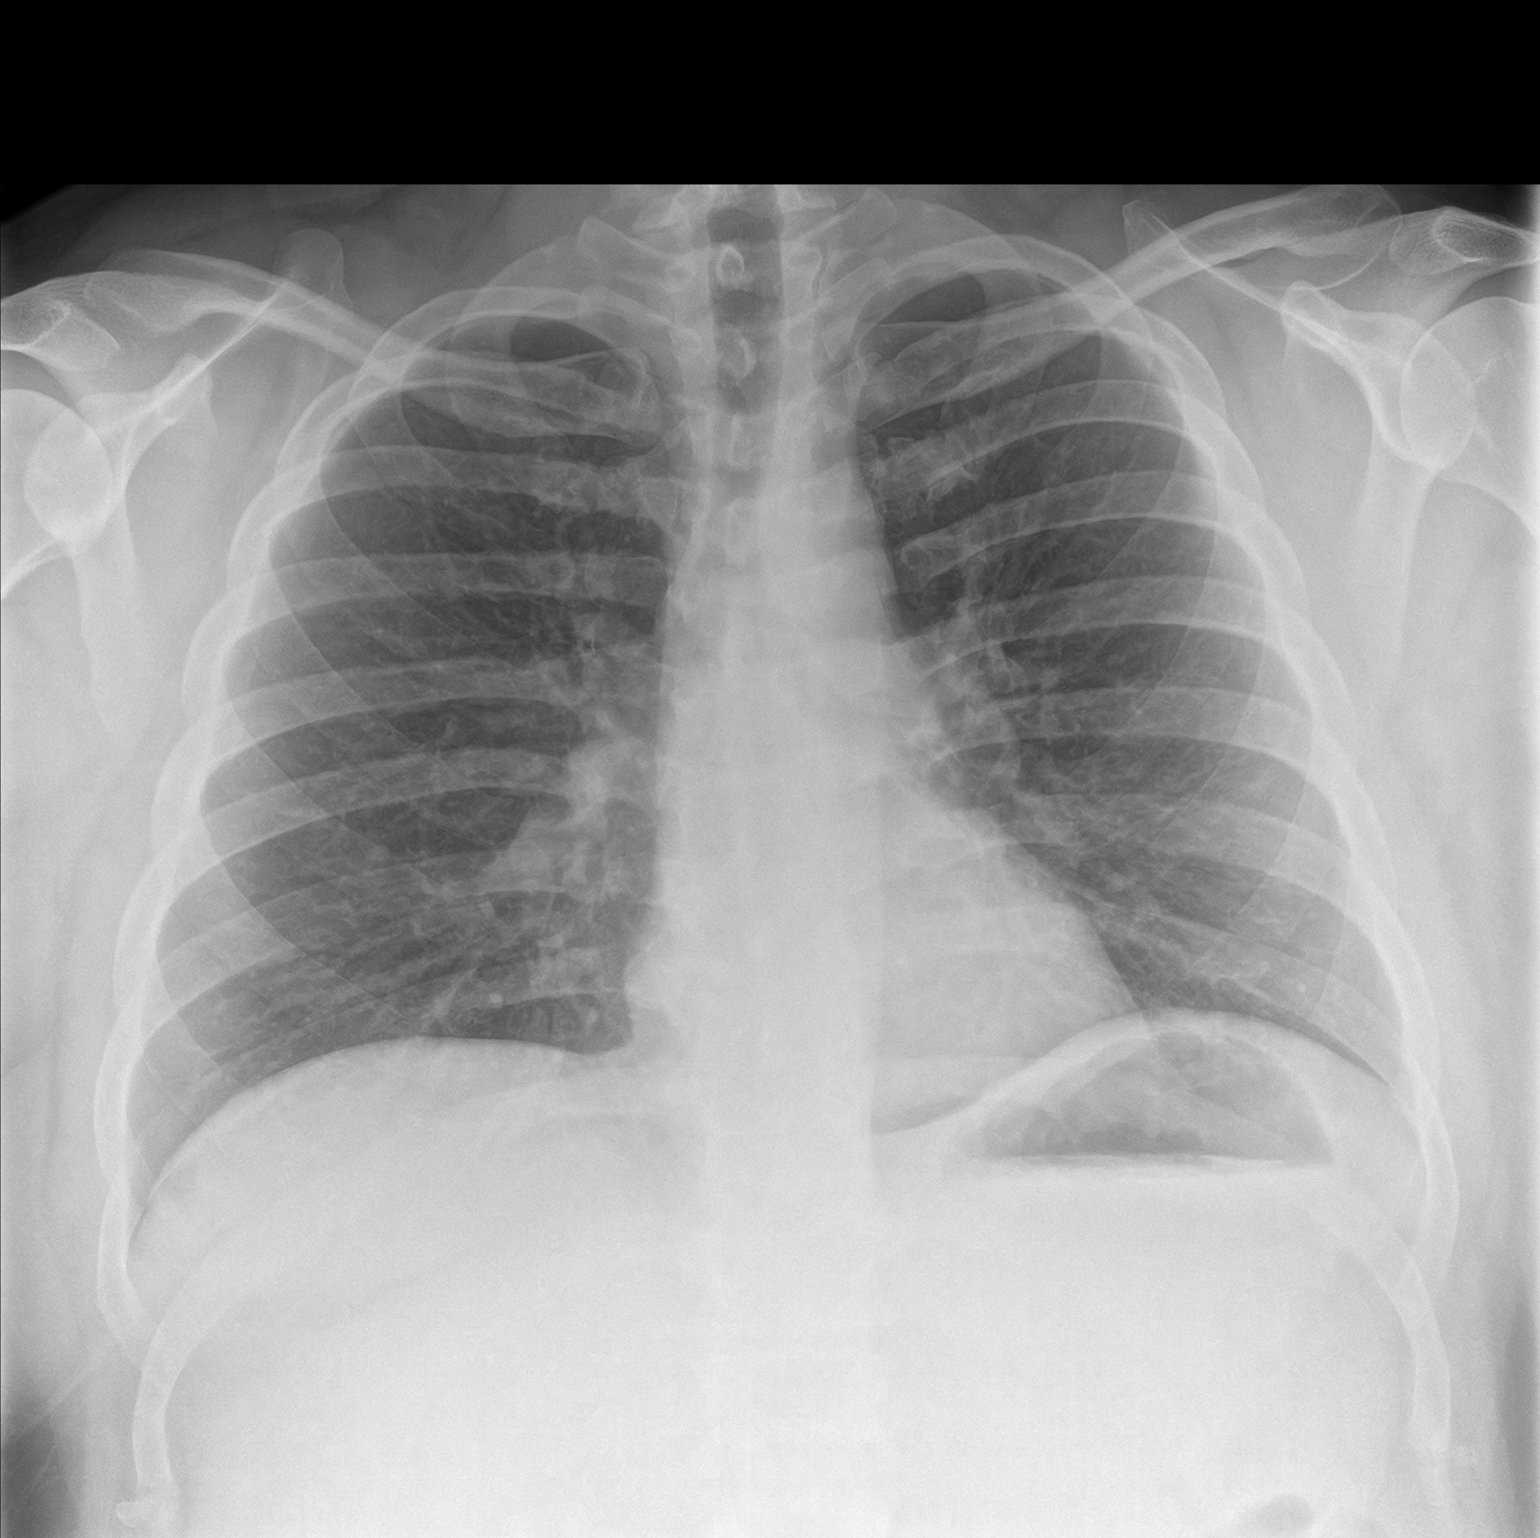
[im 2/2]
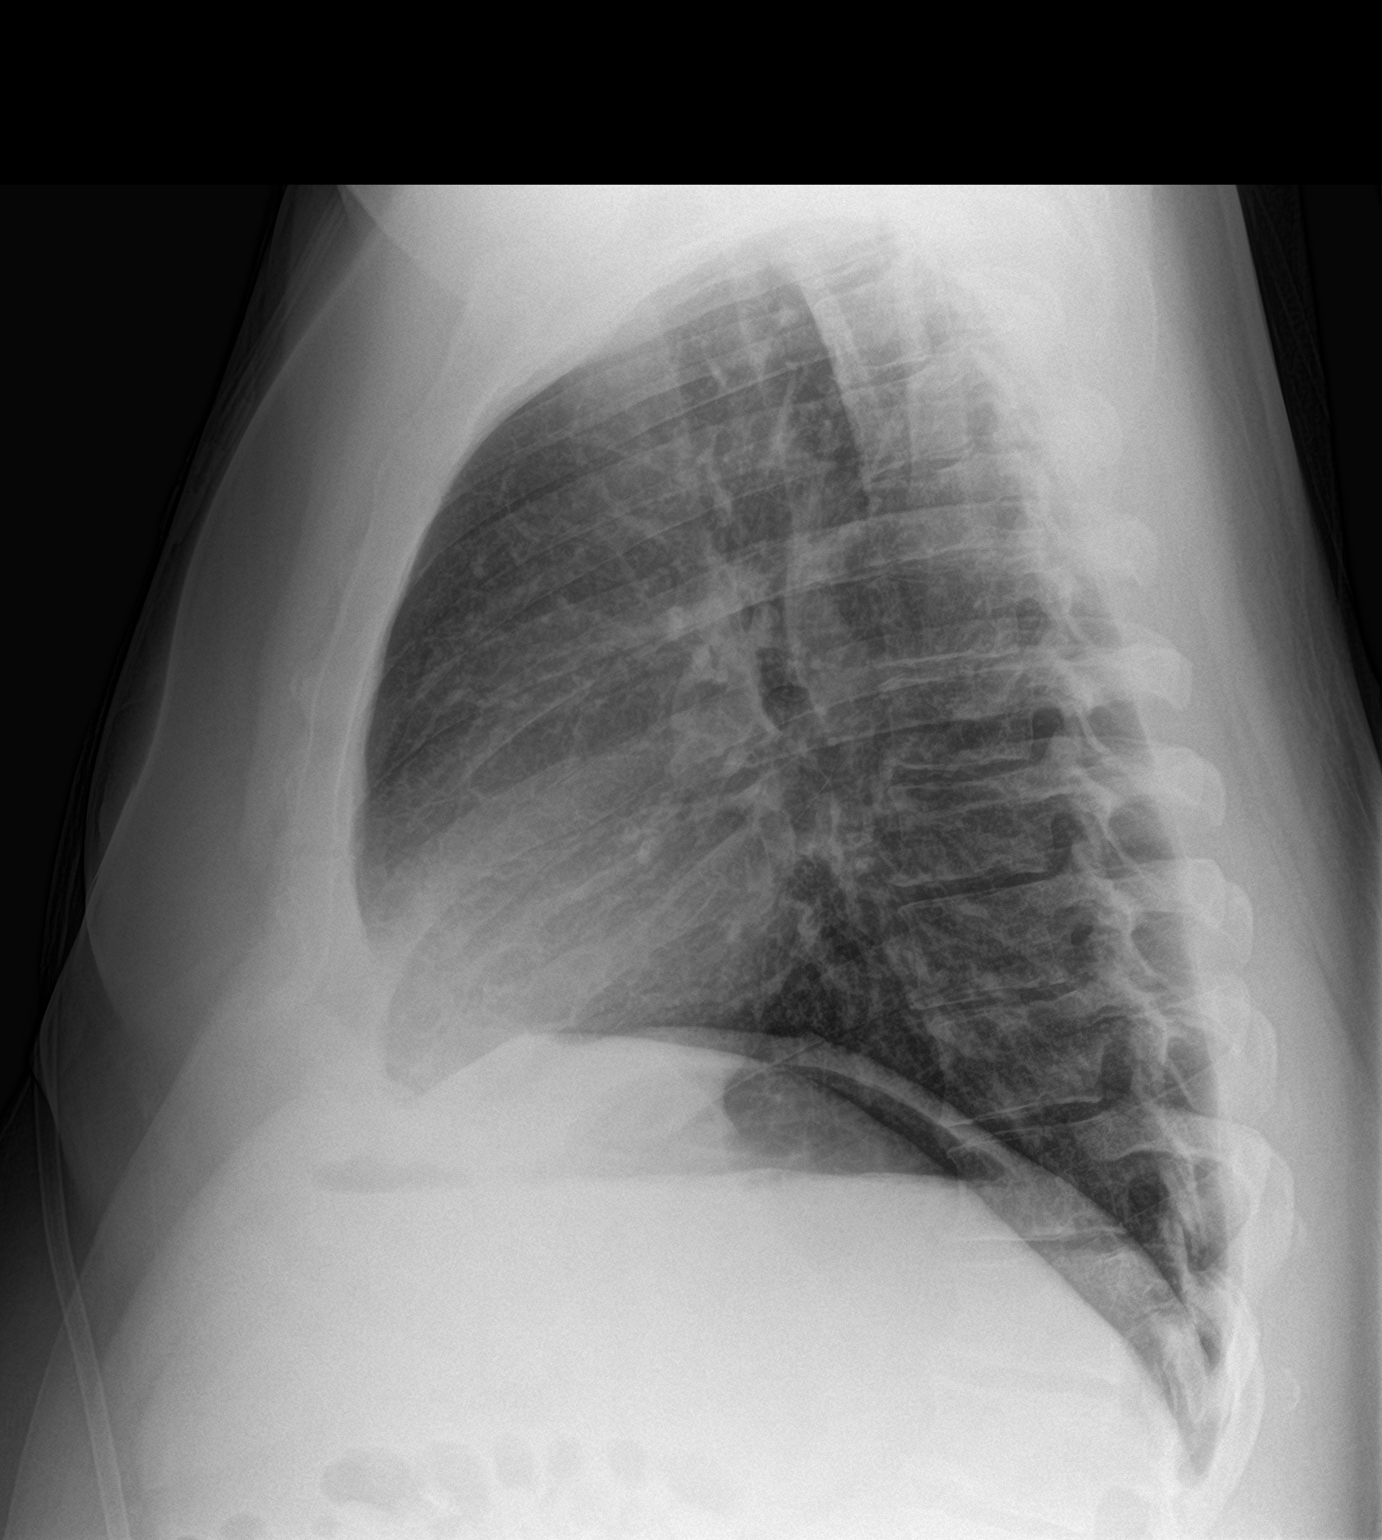

[2 of 2 positions shown; findings below may reference images not displayed]

FINDINGS: The heart size and mediastinal contours are within normal limits.
Both lungs are clear. The visualized skeletal structures are
unremarkable.
IMPRESSION: No active cardiopulmonary disease.

## 2021-05-02 IMAGING — CT CT ANGIO CHEST
2 of 6 series · 18 of 46 positions shown · IV contrast (APPLIED)
Comparison: Chest radiograph July 25, 2019

CLINICAL DATA: Chest pain. Shortness of breath. Reported history of
deep venous thrombosis

EXAM:
CT ANGIOGRAPHY CHEST WITH CONTRAST
TECHNIQUE: Multidetector CT imaging of the chest was performed using the
standard protocol during bolus administration of intravenous
contrast. Multiplanar CT image reconstructions and MIPs were
obtained to evaluate the vascular anatomy.
CONTRAST:  75mL OMNIPAQUE IOHEXOL 350 MG/ML SOLN

[Series 5: thins · axial · 0.85mm/px · z∈[+1067,+1336]mm · 16 of 295 slices shown]
[im 13/295  lung]
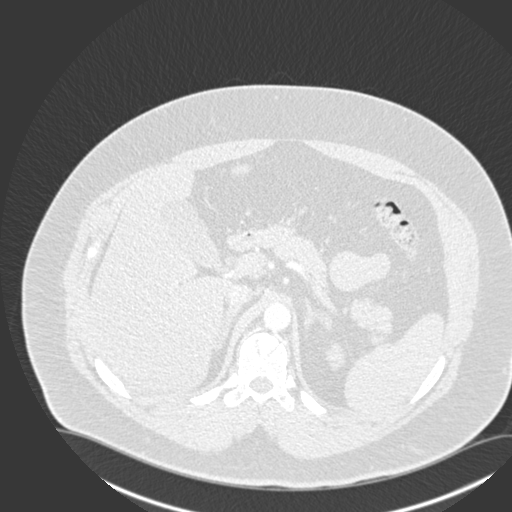
[im 39/295  soft-tissue]
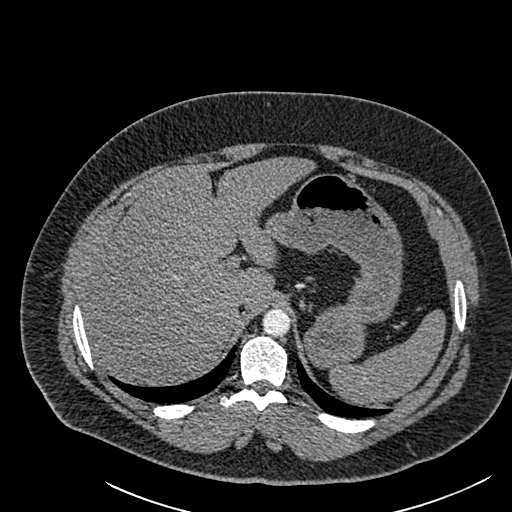
[im 52/295  lung]
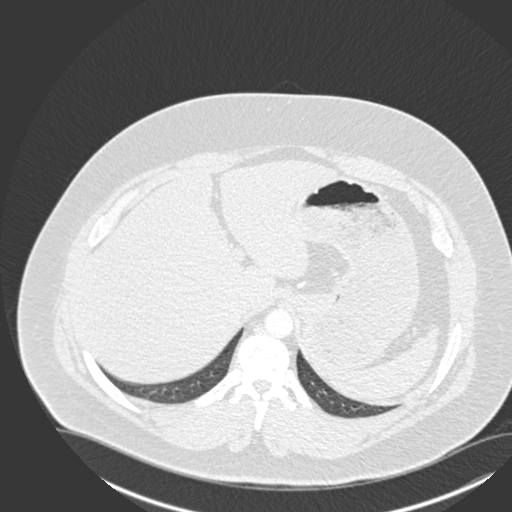
[im 64/295  soft-tissue]
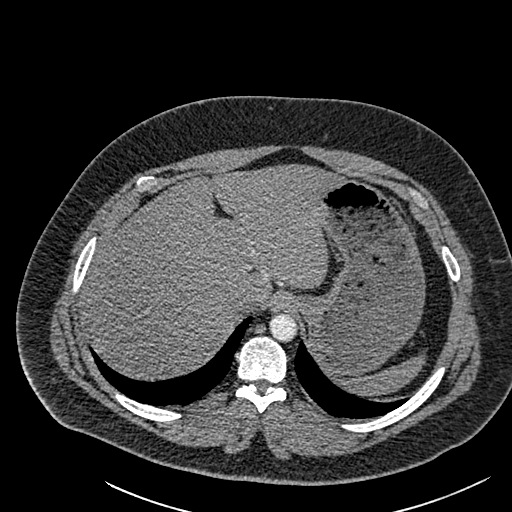
[im 90/295  lung]
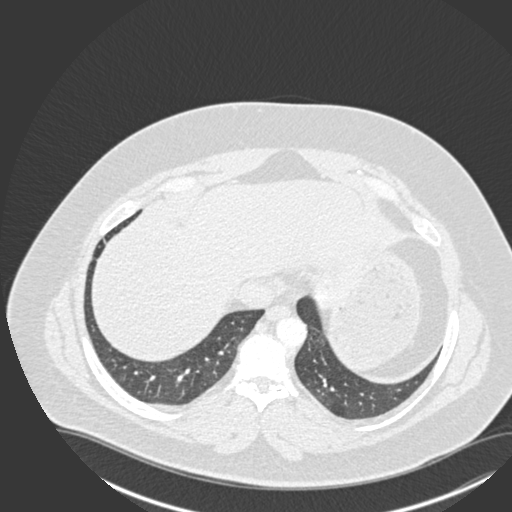
[im 103/295  soft-tissue]
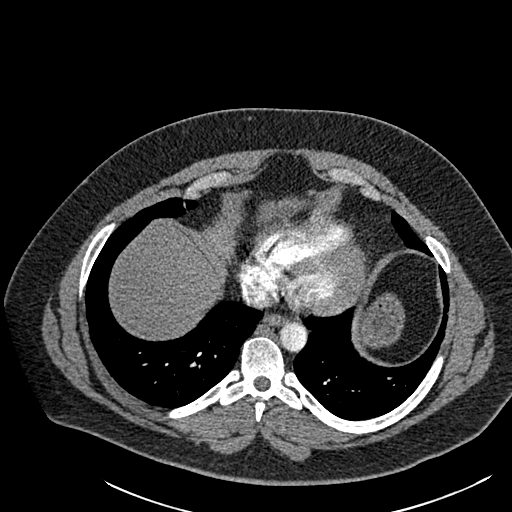
[im 116/295  lung]
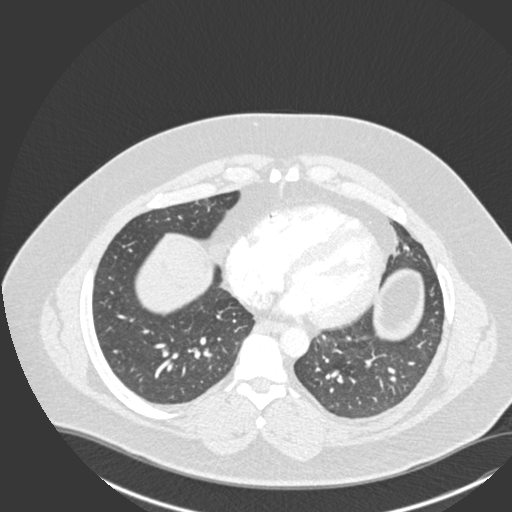
[im 141/295  soft-tissue]
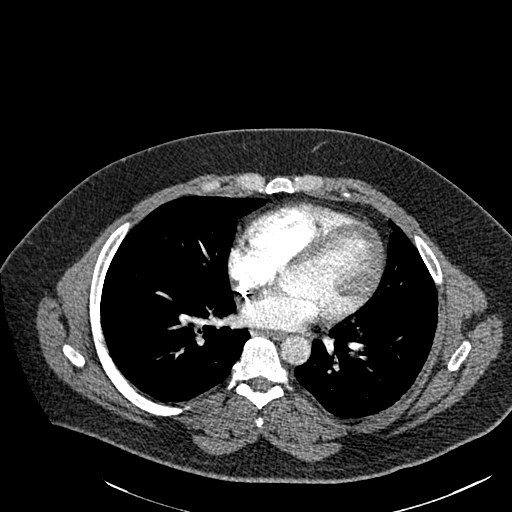
[im 154/295  lung]
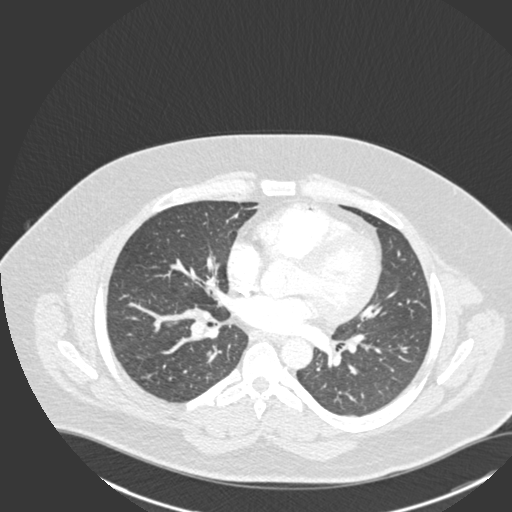
[im 179/295  soft-tissue]
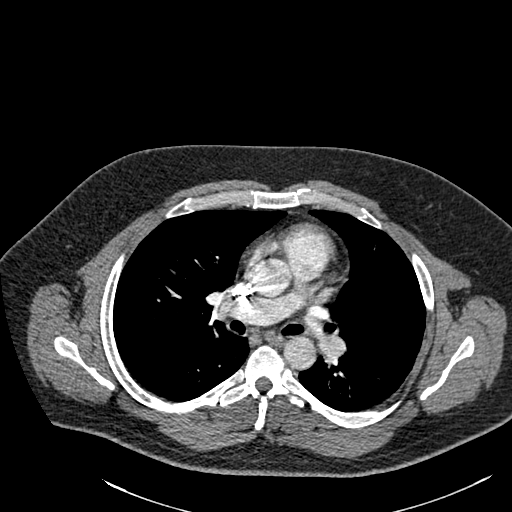
[im 192/295  lung]
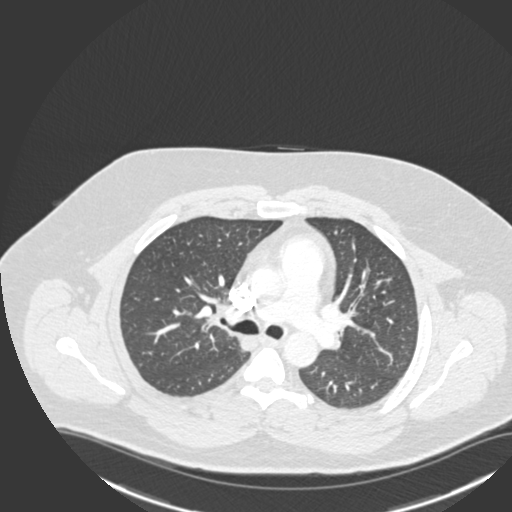
[im 205/295  soft-tissue]
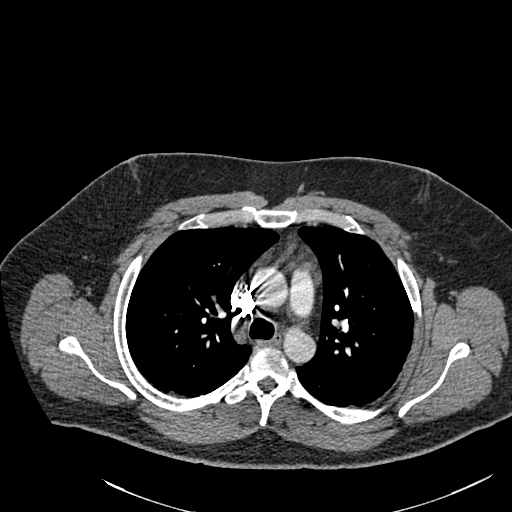
[im 231/295  lung]
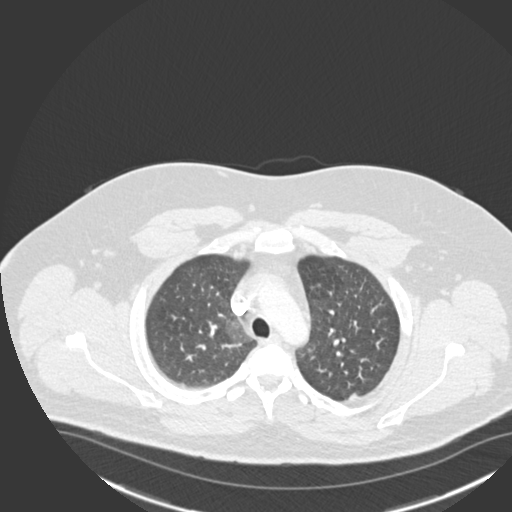
[im 243/295  soft-tissue]
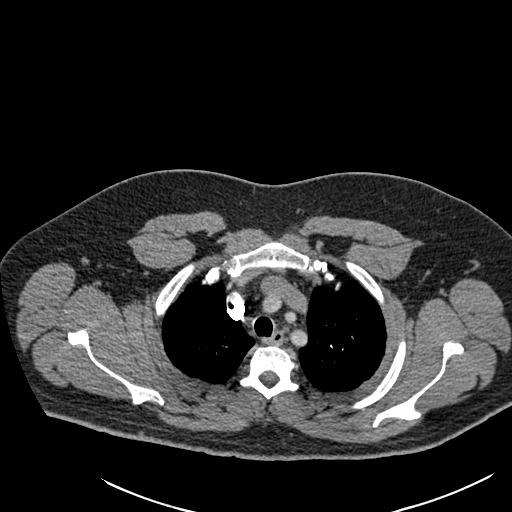
[im 256/295  lung]
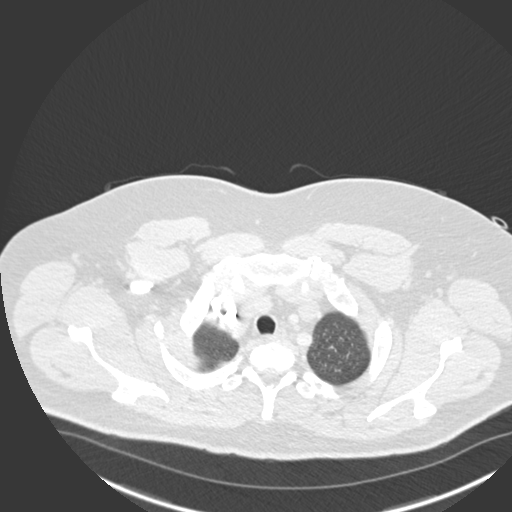
[im 282/295  soft-tissue]
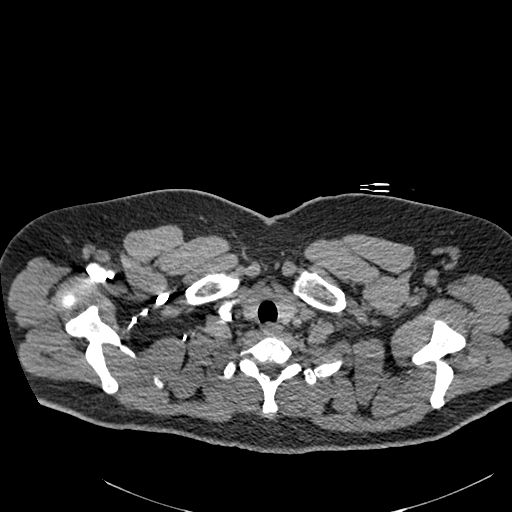

[Series 7: coronal mpr · coronal · 0.58mm/px · 2 of 97 slices shown]
[im 33/97  soft-tissue]
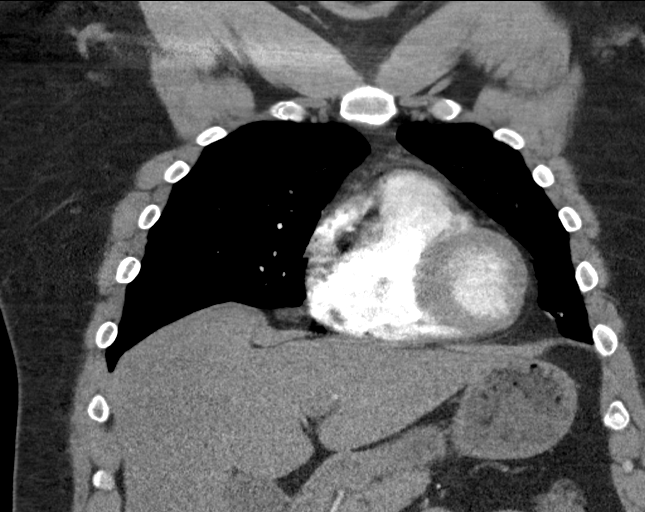
[im 65/97  soft-tissue]
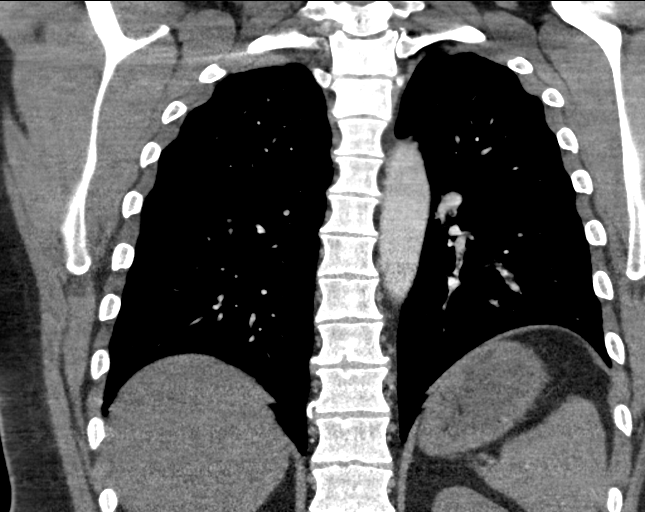

[18 of 46 positions shown; findings below may reference images not displayed]

FINDINGS: Cardiovascular: There is no demonstrable pulmonary embolus. There is
no thoracic aortic aneurysm or dissection. The visualized great
vessels appear unremarkable. There is no appreciable pericardial
effusion or pericardial thickening.

Mediastinum/Nodes: Visualized thyroid appears unremarkable. No
evident thoracic adenopathy. No esophageal lesions are evident.

Lungs/Pleura: There is slight bibasilar atelectasis anteriorly.
There is no evident edema or consolidation. There is no appreciable
pleural effusion. On axial slice 41 series 6, there is a 2 mm
nodular opacity in the anterior segment of the left upper lobe.

Upper Abdomen: There is hepatic steatosis. Visualized upper
abdominal structures otherwise appear unremarkable.

Musculoskeletal: There are no blastic or lytic bone lesions. No
chest wall lesions are demonstrable.

Review of the MIP images confirms the above findings.
IMPRESSION: 1. No demonstrable pulmonary embolus. No thoracic aortic aneurysm or
dissection.

2. Slight atelectatic change. No edema or consolidation. 2 mm
nodular opacity in the left upper lobe. No follow-up needed if
patient is low-risk. Non-contrast chest CT can be considered in 12
months if patient is high-risk. This recommendation follows the
consensus statement: Guidelines for Management of Incidental
Pulmonary Nodules Detected on CT Images: From the [HOSPITAL]

3.  No evident adenopathy.

4.  Hepatic steatosis.

## 2021-08-25 ENCOUNTER — Other Ambulatory Visit: Payer: Self-pay

## 2021-08-25 ENCOUNTER — Emergency Department: Payer: 59

## 2021-08-25 DIAGNOSIS — Z7984 Long term (current) use of oral hypoglycemic drugs: Secondary | ICD-10-CM | POA: Diagnosis not present

## 2021-08-25 DIAGNOSIS — E119 Type 2 diabetes mellitus without complications: Secondary | ICD-10-CM | POA: Insufficient documentation

## 2021-08-25 DIAGNOSIS — Z20822 Contact with and (suspected) exposure to covid-19: Secondary | ICD-10-CM | POA: Insufficient documentation

## 2021-08-25 DIAGNOSIS — Z87891 Personal history of nicotine dependence: Secondary | ICD-10-CM | POA: Diagnosis not present

## 2021-08-25 DIAGNOSIS — J449 Chronic obstructive pulmonary disease, unspecified: Secondary | ICD-10-CM | POA: Diagnosis not present

## 2021-08-25 DIAGNOSIS — Z7901 Long term (current) use of anticoagulants: Secondary | ICD-10-CM | POA: Insufficient documentation

## 2021-08-25 DIAGNOSIS — R000 Tachycardia, unspecified: Secondary | ICD-10-CM | POA: Diagnosis not present

## 2021-08-25 DIAGNOSIS — R1031 Right lower quadrant pain: Secondary | ICD-10-CM | POA: Diagnosis present

## 2021-08-25 DIAGNOSIS — A084 Viral intestinal infection, unspecified: Secondary | ICD-10-CM | POA: Insufficient documentation

## 2021-08-25 DIAGNOSIS — J40 Bronchitis, not specified as acute or chronic: Secondary | ICD-10-CM | POA: Insufficient documentation

## 2021-08-25 LAB — COMPREHENSIVE METABOLIC PANEL
ALT: 58 U/L — ABNORMAL HIGH (ref 0–44)
AST: 82 U/L — ABNORMAL HIGH (ref 15–41)
Albumin: 3.9 g/dL (ref 3.5–5.0)
Alkaline Phosphatase: 93 U/L (ref 38–126)
Anion gap: 9 (ref 5–15)
BUN: 15 mg/dL (ref 6–20)
CO2: 23 mmol/L (ref 22–32)
Calcium: 8.2 mg/dL — ABNORMAL LOW (ref 8.9–10.3)
Chloride: 100 mmol/L (ref 98–111)
Creatinine, Ser: 0.81 mg/dL (ref 0.61–1.24)
GFR, Estimated: >60 mL/min (ref >60)
Glucose, Bld: 305 mg/dL — ABNORMAL HIGH (ref 70–99)
Potassium: 3.8 mmol/L (ref 3.5–5.1)
Sodium: 132 mmol/L — ABNORMAL LOW (ref 135–145)
Total Bilirubin: 1 mg/dL (ref 0.3–1.2)
Total Protein: 7.5 g/dL (ref 6.5–8.1)

## 2021-08-25 LAB — CBC
HCT: 49.5 % (ref 39.0–52.0)
Hemoglobin: 16.4 g/dL (ref 13.0–17.0)
MCH: 30.5 pg (ref 26.0–34.0)
MCHC: 33.1 g/dL (ref 30.0–36.0)
MCV: 92.2 fL (ref 80.0–100.0)
Platelets: 176 10*3/uL (ref 150–400)
RBC: 5.37 MIL/uL (ref 4.22–5.81)
RDW: 12.7 % (ref 11.5–15.5)
WBC: 11.7 10*3/uL — ABNORMAL HIGH (ref 4.0–10.5)
nRBC: 0 % (ref 0.0–0.2)

## 2021-08-25 LAB — TROPONIN I (HIGH SENSITIVITY): Troponin I (High Sensitivity): 5 ng/L (ref <18)

## 2021-08-25 LAB — RESP PANEL BY RT-PCR (FLU A&B, COVID) ARPGX2
Influenza A by PCR: NEGATIVE
Influenza B by PCR: NEGATIVE
SARS Coronavirus 2 by RT PCR: NEGATIVE

## 2021-08-25 LAB — LIPASE, BLOOD: Lipase: 38 U/L (ref 11–51)

## 2021-08-25 NOTE — ED Provider Notes (Signed)
Emergency Medicine Provider Triage Evaluation Note  Ricardo Wood , a 40 y.o. male  was evaluated in triage.  Pt complains of cough, chest pain, shortness of breath, nausea, vomiting, and diarrhea over the past 24 hours.  Patient reports a history of DVT with IVC filter in place, but has been off of his blood thinners for at least the past month..  Review of Systems  Positive: Chest pain, shortness of breath, cough, vomiting, diarrhea. Negative: Fever, dysuria, flank pain.  Physical Exam  BP 117/86 (BP Location: Right Arm)   Pulse (!) 129   Temp 98.4 F (36.9 C) (Oral)   Resp (!) 28   SpO2 94%  Gen:   Awake, no distress  Resp:  Normal effort MSK:   Moves extremities without difficulty Other:  2+ radial pulses bilaterally, no chest wall tenderness to palpation.  Medical Decision Making  Medically screening exam initiated at 9:16 PM.  Appropriate orders placed.  Ricardo Wood was informed that the remainder of the evaluation will be completed by another provider, this initial triage assessment does not replace that evaluation, and the importance of remaining in the ED until their evaluation is complete.    Chesley Noon, MD 08/25/21 2118

## 2021-08-25 NOTE — ED Triage Notes (Signed)
Pt states that he has had n/v/d and right lower abd pain that started today, pt states pain is worse when he coughs.

## 2021-08-26 ENCOUNTER — Emergency Department: Payer: 59

## 2021-08-26 ENCOUNTER — Emergency Department
Admission: EM | Admit: 2021-08-26 | Discharge: 2021-08-26 | Disposition: A | Discharge status: Home / Self Care | Payer: 59 | Attending: Emergency Medicine | Admitting: Emergency Medicine

## 2021-08-26 DIAGNOSIS — J40 Bronchitis, not specified as acute or chronic: Secondary | ICD-10-CM

## 2021-08-26 DIAGNOSIS — B349 Viral infection, unspecified: Secondary | ICD-10-CM

## 2021-08-26 DIAGNOSIS — A084 Viral intestinal infection, unspecified: Secondary | ICD-10-CM

## 2021-08-26 LAB — D-DIMER, QUANTITATIVE: D-Dimer, Quant: 0.74 ug/mL-FEU — ABNORMAL HIGH (ref 0.00–0.50)

## 2021-08-26 LAB — TROPONIN I (HIGH SENSITIVITY): Troponin I (High Sensitivity): 6 ng/L (ref <18)

## 2021-08-26 MED ORDER — ONDANSETRON HCL 4 MG PO TABS: 4.0000 mg | ORAL_TABLET | Freq: Every day | ORAL | 1 refills | Status: AC | PRN

## 2021-08-26 MED ORDER — KETOROLAC TROMETHAMINE 30 MG/ML IJ SOLN
INTRAMUSCULAR | Status: AC
  Filled 2021-08-26: qty 1

## 2021-08-26 MED ORDER — KETOROLAC TROMETHAMINE 30 MG/ML IJ SOLN
15.0000 mg | Freq: Once | INTRAMUSCULAR | Status: AC
  Administered 2021-08-26: 15 mg via INTRAVENOUS

## 2021-08-26 MED ORDER — IOHEXOL 350 MG/ML SOLN
125.0000 mL | Freq: Once | INTRAVENOUS | Status: AC | PRN
  Administered 2021-08-26: 125 mL via INTRAVENOUS

## 2021-08-26 MED ORDER — METFORMIN HCL 500 MG PO TABS: 500.0000 mg | ORAL_TABLET | Freq: Two times a day (BID) | ORAL | 11 refills | Status: DC

## 2021-08-26 MED ORDER — LACTATED RINGERS IV BOLUS
1000.0000 mL | Freq: Once | INTRAVENOUS | Status: AC
  Administered 2021-08-26: 1000 mL via INTRAVENOUS

## 2021-08-26 MED ORDER — AZITHROMYCIN 250 MG PO TABS: ORAL_TABLET | ORAL | 0 refills | Status: AC

## 2021-08-26 MED ORDER — ALBUTEROL SULFATE HFA 108 (90 BASE) MCG/ACT IN AERS: 2.0000 | INHALATION_SPRAY | Freq: Four times a day (QID) | RESPIRATORY_TRACT | 2 refills | Status: DC | PRN

## 2021-08-26 MED ORDER — PREDNISONE 20 MG PO TABS: 60.0000 mg | ORAL_TABLET | Freq: Every day | ORAL | 0 refills | Status: AC

## 2021-08-26 MED ORDER — ONDANSETRON HCL 4 MG/2ML IJ SOLN
4.0000 mg | Freq: Once | INTRAMUSCULAR | Status: AC
  Administered 2021-08-26: 4 mg via INTRAVENOUS

## 2021-08-26 MED ORDER — ONDANSETRON HCL 4 MG/2ML IJ SOLN
INTRAMUSCULAR | Status: AC
  Filled 2021-08-26: qty 2

## 2021-08-26 NOTE — ED Provider Notes (Signed)
[facility] Emergency Department Provider Note  ____________________________________________  Time seen: Approximately 3:09 AM  I have reviewed the triage vital signs and the nursing notes.   HISTORY  Chief Complaint Abdominal Pain and Nausea   HPI Ricardo Wood is a 40 y.o. 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  Patient reports that his daughter came home from school last week with a cold.  He has had 4 days of a mild cough.  Today started having vomiting and diarrhea.  Reports several episodes over the last 24 hours.  Denies any hematemesis, melena, hematochezia, coffee-ground emesis.  Patient reports that when he coughs he has a pretty severe sharp pain in the right lower quadrant but that usually subsides after 10 to 15 minutes.  He otherwise denies any other abdominal pain. He reports that sometimes while coughing he will have some wheezing and some chest tightness but that usually resolves quickly. Denies shortness of breath, denies fever or chills.  Patient reports that he unfortunately cannot afford his Eliquis.  Has been off of it for very long time.  He denies any leg pain or swelling or pleuritic chest pain.  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 Authorizing Provider  albuterol (VENTOLIN HFA) 108 (90 Base) MCG/ACT inhaler Inhale 2 puffs into the lungs every 6  (six) hours as needed for wheezing or shortness of breath. 08/26/21  Yes Alfred Levins, [place], MD  azithromycin (ZITHROMAX Z-PAK) 250 MG tablet Take 2 tablets (500 mg) on  Day 1,  followed by 1 tablet (250 mg) once daily on Days 2 through 5. 08/26/21 08/31/21 Yes Alfred Levins, [place], MD  metFORMIN (GLUCOPHAGE) 500 MG tablet Take 1 tablet (500 mg total) by mouth 2 (two) times daily with a meal. 08/26/21 08/26/22 Yes Burdette Gergely, [place], MD  ondansetron (ZOFRAN) 4 MG tablet Take 1 tablet (4 mg total) by mouth daily as needed for nausea or vomiting. 08/26/21 08/26/22 Yes Jodeci Roarty, [place], MD  predniSONE (DELTASONE) 20 MG tablet Take 3 tablets (60 mg total) by mouth daily with breakfast for 4 days. 08/26/21 08/30/21 Yes Jolette Lana, [place], MD  amoxicillin (AMOXIL) 500 MG capsule Take 1 capsule (500 mg total) by mouth 3 (three) times daily. 01/31/20   Victorino Dike, FNP  ELIQUIS 5 MG TABS tablet  08/08/20   [provider]  hydrocortisone 2.5 % lotion Apply topically 2 (two) times daily. 01/31/20   Triplett, Johnette Abraham B, FNP  hydrOXYzine (ATARAX/VISTARIL) 25 MG tablet Take 1 tablet (25 mg total) by mouth 3 (three) times daily as needed for itching. 01/31/20   Triplett, Cari B, FNP  lidocaine (XYLOCAINE) 2 % solution Use as directed 10 mLs in the mouth or throat as needed. Patient not taking: Reported on 10/17/2020 10/06/19   Laban Emperor, PA-C  magic mouthwash w/lidocaine SOLN Take 10 mLs by mouth 4 (four) times daily as needed  for mouth pain. Patient not taking: Reported on 10/17/2020 01/31/20   Chinita Pester, FNP  RIVAROXABAN Carlena Hurl) VTE STARTER PACK (15 & 20 MG TABLETS) Follow package directions: Take one 15mg  tablet by mouth twice a day. On day 22, switch to one 20mg  tablet once a day. Take with food. 06/17/20   , MD  SPIRIVA HANDIHALER 18 MCG inhalation capsule  08/08/20   [provider]  [facility] 160-4.5 MCG/ACT inhaler  08/08/20   [provider]     Allergies Onion  No family history on file.  Social History Social History   Tobacco Use   Smoking status: Former   Smokeless tobacco: Never  Substance Use Topics   Alcohol use: No   Drug use: Yes    Types: Marijuana    Comment: last smoked about a week ago    Review of Systems  Constitutional: Negative for fever. Eyes: Negative for visual changes. ENT: Negative for sore throat. Neck: No neck pain  Cardiovascular: Negative for chest pain. + chest tightness Respiratory: Negative for shortness of breath. + cough and wheezing Gastrointestinal: + abdominal pain, vomiting and diarrhea. Genitourinary: Negative for dysuria. Musculoskeletal: Negative for back pain. Skin: Negative for rash. Neurological: Negative for headaches, weakness or numbness. Psych: No SI or HI  ____________________________________________   PHYSICAL EXAM:  VITAL SIGNS: ED Triage Vitals  Enc Vitals Group     BP 08/25/21 2114 117/86     Pulse Rate 08/25/21 2114 (!) 129     Resp 08/25/21 2114 18     Temp 08/25/21 2114 98.4 F (36.9 C)     Temp Source 08/25/21 2114 Oral     SpO2 08/25/21 2114 94 %     Weight --      Height --      Head Circumference --      Peak Flow --      Pain Score 08/25/21 2113 10     Pain Loc --      Pain Edu? --      Excl. in GC? --     Constitutional: Alert and oriented. Well appearing and in no apparent distress. HEENT:      Head: Normocephalic and atraumatic.         Eyes: Conjunctivae are normal. Sclera is non-icteric.       Mouth/Throat: Mucous membranes are moist.       Neck: Supple with no signs of meningismus. Cardiovascular: Tachycardic with regular rhythm Respiratory: Normal work of breathing, normal sats, faint expiratory wheezes bilaterally with good air movement Gastrointestinal: Soft, non tender, and non distended with positive bowel sounds. No rebound or guarding. Genitourinary: No CVA tenderness. Musculoskeletal:  No edema, cyanosis, or  erythema of extremities. Neurologic: Normal speech and language. Face is symmetric. Moving all extremities. No gross focal neurologic deficits are appreciated. Skin: Skin is warm, dry and intact. No rash noted. Psychiatric: Mood and affect are normal. Speech and behavior are normal.  ____________________________________________   LABS (all labs ordered are listed, but only abnormal results are displayed)  Labs Reviewed  CBC - Abnormal; Notable for the following components:      Result Value   WBC 11.7 (*)    All other components within normal limits  COMPREHENSIVE METABOLIC PANEL - Abnormal; Notable for the following components:   Sodium 132 (*)    Glucose, Bld 305 (*)    Calcium 8.2 (*)    AST 82 (*)    ALT 58 (*)  All other components within normal limits  D-DIMER, QUANTITATIVE - Abnormal; Notable for the following components:   D-Dimer, Quant 0.74 (*)    All other components within normal limits  RESP PANEL BY RT-PCR (FLU A&B, COVID) ARPGX2  LIPASE, BLOOD  TROPONIN I (HIGH SENSITIVITY)  TROPONIN I (HIGH SENSITIVITY)   ____________________________________________  EKG  ED ECG REPORT I, Rudene Re, the attending physician, personally viewed and interpreted this ECG.  Sinus tachycardia, rate of 126, normal intervals and normal axis, no ST elevations or depressions. ____________________________________________  RADIOLOGY  I have personally reviewed the images performed during this visit and I agree with the Radiologist's read.   Interpretation by Radiologist:  DG Chest 2 View  Result Date: 08/26/2021 CLINICAL DATA:  Cough and chest pain EXAM: CHEST - 2 VIEW COMPARISON:  07/25/2019 FINDINGS: The heart size and mediastinal contours are within normal limits. Both lungs are clear. The visualized skeletal structures are unremarkable. IMPRESSION: No active cardiopulmonary disease. Electronically Signed   By: Ulyses Jarred M.D.   On: 08/26/2021 00:25   CT Angio  Chest PE W/Cm &/Or Wo Cm  Result Date: 08/26/2021 CLINICAL DATA:  Coughing, chest pain, right lower quadrant pain. EXAM: CT ANGIOGRAPHY CHEST CT ABDOMEN AND PELVIS WITH CONTRAST TECHNIQUE: Multidetector CT imaging of the chest was performed using the standard protocol during bolus administration of intravenous contrast. Multiplanar CT image reconstructions and MIPs were obtained to evaluate the vascular anatomy. Multidetector CT imaging of the abdomen and pelvis was performed using the standard protocol during bolus administration of intravenous contrast. CONTRAST:  166mL OMNIPAQUE IOHEXOL 350 MG/ML SOLN COMPARISON:  CTA chest 03/06/2020, CT abdomen and pelvis without contrast 04/05/2015 FINDINGS: CTA CHEST FINDINGS Cardiovascular: The cardiac size is normal. There is no pericardial effusion. Thoracic aortic caliber, branching and enhancement are normal without plaques, aneurysm or dissection. There is increasing prominence of the pulmonary trunk which measures 3.5 cm indicating arterial hypertension, previously 3.1 cm. There is no right heart chamber expansion or IVC reflux. The pulmonary trunk and central arteries are clear but the segmental and subsegmental arteries are unopacified due to bolus timing and not evaluated. Respiratory motion artifact further limits evaluation of the arteries distal to the hila. Mediastinum/Nodes: No enlarged mediastinal, hilar, or axillary lymph nodes. Thyroid gland, trachea, and esophagus demonstrate no significant findings. Lungs/Pleura: The lungs are clear of infiltrates. No pleural effusion or pneumothorax are observed. There is linear scarring or atelectasis again noted in the lingular base. There is central bronchial thickening in the right upper and both lower lobes consistent with bronchitis. Musculoskeletal: There are mild degenerative changes of the thoracic spine. No focal lesion is seen. Review of the MIP images confirms the above findings. CT ABDOMEN and PELVIS  FINDINGS Hepatobiliary: Liver is 23 cm length moderately steatotic. No mass enhancement. Gallbladder and bile ducts unremarkable. Pancreas: Unremarkable. Spleen: Unremarkable. Adrenals/Urinary Tract: There is no adrenal mass. There scattered punctate up to 3 mm nonobstructive caliceal stones in the right kidney. There are few punctate nonobstructing caliceal stones in the left lower pole. There is no obstructing stone or hydroureteronephrosis. No focal abnormality of the bilateral renal cortex. Unremarkable bladder wall and lumen. Stomach/Bowel: There is fold thickening of the stomach and jejunum but no small bowel obstruction or inflammatory changes. Normal appendix. Colonic diverticulosis without evidence of colitis or diverticulitis. Vascular/Lymphatic: IVC filter in place. Unremarkable abdominal aorta and branch vessels. Prominent portal vein measures 1.7 cm, stable. Reproductive: Normal prostate. Other: There is no free air, hemorrhage or fluid.  There is a tiny umbilical fat hernia. Musculoskeletal: There are chronic L5 pars defects, grade 1 chronic L5-S1 spondylolisthesis, degenerative changes in the thoracic and lumbar spine. Review of the MIP images confirms the above findings. IMPRESSION: 1. No evidence of central pulmonary arterial embolus. Pulmonary trunk is more prominent than previously. The segmental and subsegmental arteries are not evaluated due to breathing motion and bolus timing. 2. Evidence of right upper and bilateral lower lobe bronchitis, but no evidence of focal pneumonia. 3. Nonobstructive bilateral micronephrolithiasis. No obstructing stones either side. 4. Gastroenteritis without evidence of bowel obstruction or inflammation. Diverticulosis. 5. Fatty liver. 6. Degenerative changes of the spine with chronic L5 pars defects and a chronic grade 1 L5-S1 spondylolisthesis. 7. Chronically prominent hepatic portal main vein. Electronically Signed   By: Telford Nab M.D.   On: 08/26/2021 02:21    CT Abdomen Pelvis W Contrast  Result Date: 08/26/2021 CLINICAL DATA:  Coughing, chest pain, right lower quadrant pain. EXAM: CT ANGIOGRAPHY CHEST CT ABDOMEN AND PELVIS WITH CONTRAST TECHNIQUE: Multidetector CT imaging of the chest was performed using the standard protocol during bolus administration of intravenous contrast. Multiplanar CT image reconstructions and MIPs were obtained to evaluate the vascular anatomy. Multidetector CT imaging of the abdomen and pelvis was performed using the standard protocol during bolus administration of intravenous contrast. CONTRAST:  174mL OMNIPAQUE IOHEXOL 350 MG/ML SOLN COMPARISON:  CTA chest 03/06/2020, CT abdomen and pelvis without contrast 04/05/2015 FINDINGS: CTA CHEST FINDINGS Cardiovascular: The cardiac size is normal. There is no pericardial effusion. Thoracic aortic caliber, branching and enhancement are normal without plaques, aneurysm or dissection. There is increasing prominence of the pulmonary trunk which measures 3.5 cm indicating arterial hypertension, previously 3.1 cm. There is no right heart chamber expansion or IVC reflux. The pulmonary trunk and central arteries are clear but the segmental and subsegmental arteries are unopacified due to bolus timing and not evaluated. Respiratory motion artifact further limits evaluation of the arteries distal to the hila. Mediastinum/Nodes: No enlarged mediastinal, hilar, or axillary lymph nodes. Thyroid gland, trachea, and esophagus demonstrate no significant findings. Lungs/Pleura: The lungs are clear of infiltrates. No pleural effusion or pneumothorax are observed. There is linear scarring or atelectasis again noted in the lingular base. There is central bronchial thickening in the right upper and both lower lobes consistent with bronchitis. Musculoskeletal: There are mild degenerative changes of the thoracic spine. No focal lesion is seen. Review of the MIP images confirms the above findings. CT ABDOMEN and  PELVIS FINDINGS Hepatobiliary: Liver is 23 cm length moderately steatotic. No mass enhancement. Gallbladder and bile ducts unremarkable. Pancreas: Unremarkable. Spleen: Unremarkable. Adrenals/Urinary Tract: There is no adrenal mass. There scattered punctate up to 3 mm nonobstructive caliceal stones in the right kidney. There are few punctate nonobstructing caliceal stones in the left lower pole. There is no obstructing stone or hydroureteronephrosis. No focal abnormality of the bilateral renal cortex. Unremarkable bladder wall and lumen. Stomach/Bowel: There is fold thickening of the stomach and jejunum but no small bowel obstruction or inflammatory changes. Normal appendix. Colonic diverticulosis without evidence of colitis or diverticulitis. Vascular/Lymphatic: IVC filter in place. Unremarkable abdominal aorta and branch vessels. Prominent portal vein measures 1.7 cm, stable. Reproductive: Normal prostate. Other: There is no free air, hemorrhage or fluid. There is a tiny umbilical fat hernia. Musculoskeletal: There are chronic L5 pars defects, grade 1 chronic L5-S1 spondylolisthesis, degenerative changes in the thoracic and lumbar spine. Review of the MIP images confirms the above findings. IMPRESSION: 1. No  evidence of central pulmonary arterial embolus. Pulmonary trunk is more prominent than previously. The segmental and subsegmental arteries are not evaluated due to breathing motion and bolus timing. 2. Evidence of right upper and bilateral lower lobe bronchitis, but no evidence of focal pneumonia. 3. Nonobstructive bilateral micronephrolithiasis. No obstructing stones either side. 4. Gastroenteritis without evidence of bowel obstruction or inflammation. Diverticulosis. 5. Fatty liver. 6. Degenerative changes of the spine with chronic L5 pars defects and a chronic grade 1 L5-S1 spondylolisthesis. 7. Chronically prominent hepatic portal main vein. Electronically Signed   By: Telford Nab M.D.   On: 08/26/2021  02:21     ____________________________________________   PROCEDURES  Procedure(s) performed: None Procedures   Critical Care performed:  None ____________________________________________   INITIAL IMPRESSION / ASSESSMENT AND PLAN / ED COURSE  40 y.o. 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 Daughter came home last week with a cold.  Patient is well-appearing and in no distress with normal work of breathing, has some faint expiratory wheezes but moving great air.  Tachycardic with regular rate and rhythm.  Legs have no asymmetric swelling.  Abdomen soft and nontender  Ddx viral syndrome vs PNA vs gastroenteritis versus diverticulitis versus bronchitis  EKG shows sinus tachycardia, no ischemic changes.  2 high-sensitivity troponins were negative showing no signs of myocarditis.  Since patient has been noncompliant with his medications and is complaining of mild chest tightness D-dimer was done although low suspicion for PE.  The D-dimer was positive and patient was sent for CT angio of the chest that does not show PE, no signs of pneumonia but patient does have bronchitis on CT.  We will treat patient with albuterol, azithromycin, and prednisone for this.  The CT of the abdomen shows gastroenteritis with no other acute findings.  Patient received IV Toradol, IV Zofran, and IV fluids.  He is tolerating p.o. with no further episodes of vomiting here.  We will provide a prescription for Zofran.  We discussed increase oral hydration.  Remainder of his blood work shows hyperglycemia no evidence of DKA.  Patient reports that he was told in the past that he was diabetic but never received a prescription.  We will give a prescription for metformin.  He has mildly elevated LFTs which she has had in the past.  He reports that he used to  drink but does not anymore.  Recommended follow-up with PCP for this finding.  His COVID and flu swab are negative.  Mildly elevated white count of 11.7 consistent with a viral syndrome.  I discussed my standard return precautions and follow-up with PCP.      _____________________________________________ Please note:  Patient was evaluated in Emergency Department today for the symptoms described in the history of present illness. Patient was evaluated in the context of the global COVID-19 pandemic, which necessitated consideration that the patient might be at risk for infection with the SARS-CoV-2 virus that causes COVID-19. Institutional protocols and algorithms that pertain to the evaluation of patients at risk for COVID-19 are in a state of rapid change based on information released by regulatory bodies including the CDC and federal and state organizations. These policies and algorithms were followed during the patient's care in the ED.  Some ED evaluations and interventions may be delayed as a result of limited staffing during the pandemic.  [place] Controlled Substance Database was reviewed by me. ____________________________________________   FINAL CLINICAL IMPRESSION(S) / ED DIAGNOSES   Final diagnoses:  Viral illness  Bronchitis  Viral gastroenteritis      NEW MEDICATIONS STARTED DURING THIS VISIT:  ED Discharge Orders          Ordered    albuterol (VENTOLIN HFA) 108 (90 Base) MCG/ACT inhaler  Every 6 hours PRN        08/26/21 0252    predniSONE (DELTASONE) 20 MG tablet  Daily with breakfast        08/26/21 0252    ondansetron (ZOFRAN) 4 MG tablet  Daily PRN        08/26/21 0252    azithromycin (ZITHROMAX Z-PAK) 250 MG tablet        08/26/21 0252    metFORMIN (GLUCOPHAGE) 500 MG tablet  2 times daily with meals        08/26/21 0253             Note:  This document was prepared using Dragon voice recognition software and may include unintentional dictation  errors.    Rudene Re, MD 08/26/21 [phone]

## 2021-10-19 ENCOUNTER — Other Ambulatory Visit: Payer: Self-pay

## 2021-10-19 ENCOUNTER — Encounter: Payer: Self-pay | Admitting: Emergency Medicine

## 2021-10-19 ENCOUNTER — Emergency Department
Admission: EM | Admit: 2021-10-19 | Discharge: 2021-10-19 | Disposition: A | Discharge status: Home / Self Care | Payer: Medicaid Other | Attending: Emergency Medicine | Admitting: Emergency Medicine

## 2021-10-19 DIAGNOSIS — K0889 Other specified disorders of teeth and supporting structures: Secondary | ICD-10-CM | POA: Diagnosis present

## 2021-10-19 DIAGNOSIS — K0510 Chronic gingivitis, plaque induced: Secondary | ICD-10-CM | POA: Insufficient documentation

## 2021-10-19 MED ORDER — CHLORHEXIDINE GLUCONATE 0.12 % MT SOLN
15.0000 mL | Freq: Once | OROMUCOSAL | Status: AC
  Administered 2021-10-19: 15 mL via OROMUCOSAL

## 2021-10-19 MED ORDER — AMOXICILLIN-POT CLAVULANATE 875-125 MG PO TABS: 1.0000 | ORAL_TABLET | Freq: Two times a day (BID) | ORAL | 0 refills | Status: AC

## 2021-10-19 MED ORDER — CHLORHEXIDINE GLUCONATE 0.12 % MT SOLN: 15.0000 mL | Freq: Four times a day (QID) | OROMUCOSAL | 0 refills | Status: DC

## 2021-10-19 MED ORDER — AMOXICILLIN-POT CLAVULANATE 875-125 MG PO TABS
1.0000 | ORAL_TABLET | Freq: Once | ORAL | Status: AC
  Administered 2021-10-19: 1 via ORAL
  Filled 2021-10-19: qty 1

## 2021-10-19 NOTE — ED Triage Notes (Signed)
Pt c/o mouth pain to front tooth and upper lip pain x 2 days, c/o sinus pain

## 2021-10-19 NOTE — ED Provider Notes (Signed)
[facility] Provider Note    Event Date/Time   First MD Initiated Contact with Patient 10/19/21 [phone]     (approximate)   History   Dental Pain   HPI  Ricardo Wood is a 41 y.o. male history of seizures, DVT on Eliquis who presents for evaluation of dental pain.  Patient is scheduled to have 3 teeth pulled due to severe cavities but over the last 2 days he started having pain on the gums around these frontal upper teeth.  The pain is throbbing, sharp, mild to moderate, constant and nonradiating.  He denies any trismus or fever, no difficulty swallowing or breathing.     Past Medical History:  Diagnosis Date   DVT (deep venous thrombosis) (HCC)    Kidney stones    Seizures (HCC)     Past Surgical History:  Procedure Laterality Date   dvt filter       Physical Exam   Triage Vital Signs: ED Triage Vitals  Enc Vitals Group     BP 10/19/21 0122 (!) 143/92     Pulse Rate 10/19/21 0122 89     Resp 10/19/21 0122 19     Temp 10/19/21 0122 98.8 F (37.1 C)     Temp Source 10/19/21 0122 Oral     SpO2 10/19/21 0122 94 %     Weight 10/19/21 0129 (!) 312 lb (141.5 kg)     Height 10/19/21 0124 6' (1.829 m)     Head Circumference --      Peak Flow --      Pain Score 10/19/21 0128 4     Pain Loc --      Pain Edu? --      Excl. in GC? --     Most recent vital signs: Vitals:   10/19/21 0122  BP: (!) 143/92  Pulse: 89  Resp: 19  Temp: 98.8 F (37.1 C)  SpO2: 94%     Constitutional: Alert and oriented. Well appearing and in no apparent distress. HEENT:      Head: Normocephalic and atraumatic.         Eyes: Conjunctivae are normal. Sclera is non-icteric.       Mouth/Throat: Mucous membranes are moist.  Patient has erythema with mild pus of the upper frontal gum area with no signs of abscess.  Floor the mouth is soft with no induration, no trismus      Neck: Supple with no signs of meningismus. Cardiovascular: Regular rate and rhythm.   Respiratory: Normal respiratory effort.  Musculoskeletal:  No edema, cyanosis, or erythema of extremities. Neurologic: Normal speech and language. Face is symmetric. Moving all extremities. No gross focal neurologic deficits are appreciated. Skin: Skin is warm, dry and intact. No rash noted. Psychiatric: Mood and affect are normal. Speech and behavior are normal.  ED Results / Procedures / Treatments   Labs (all labs ordered are listed, but only abnormal results are displayed) Labs Reviewed - No data to display   EKG  none   RADIOLOGY none   PROCEDURES:  Critical Care performed:   Procedures    IMPRESSION / MDM / ASSESSMENT AND PLAN / ED COURSE  I reviewed the triage vital signs and the nursing notes.  41 y.o. male history of seizures, DVT on Eliquis who presents for evaluation of dental pain.  Patient with inflamed gums with no signs of abscess  Ddx: Gingivitis   Plan: Chlorhexidine and Augmentin   MEDICATIONS GIVEN IN ED: Medications  chlorhexidine (PERIDEX) 0.12 % solution 15 mL (15 mLs Mouth/Throat Given 10/19/21 0336)  amoxicillin-clavulanate (AUGMENTIN) 875-125 MG per tablet 1 tablet (1 tablet Oral Given 10/19/21 0336)     ED COURSE: Patient with exam consistent with gingivitis with no abscess for drainage.  No signs of Ludewig's angina or deep infection.  Patient is otherwise well-appearing.  Was started on chlorhexidine mouthwashes and Augmentin.  Patient does have an appointment coming up with his dentist which I recommended keeping.  Discussed my standard return precautions.   Consults: None   EMR reviewed including last visit with his vascular surgeon from January 2022      FINAL CLINICAL IMPRESSION(S) / ED DIAGNOSES   Final diagnoses:  Gingivitis     Rx / DC Orders   ED Discharge Orders          Ordered    amoxicillin-clavulanate (AUGMENTIN) 875-125 MG tablet  2 times daily        10/19/21 0332    chlorhexidine (PERIDEX) 0.12 %  solution  4 times daily        10/19/21 6256             Note:  This document was prepared using Dragon voice recognition software and may include unintentional dictation errors.   Don Perking, [place], MD 10/19/21 [phone]

## 2021-12-13 IMAGING — US US EXTREM LOW VENOUS*L*
1 series · 13 of 24 positions shown · non-contrast
Comparison: Lower extremity Doppler 06/20/2012.
COMPARISON: Lower extremity Doppler 06/20/2012.

Addendum:
CLINICAL DATA: 38-year-old male with left leg pain for 2 days.

EXAM:
LEFT LOWER EXTREMITY VENOUS DOPPLER ULTRASOUND
TECHNIQUE: Gray-scale sonography with compression, as well as color and duplex
ultrasound, were performed to evaluate the deep venous system(s)
from the level of the common femoral vein through the popliteal and
proximal calf veins.

[Series 1: us venous img lower uni left (dvt) · portal-venous · 13 of 44 slices shown]
[im 1/44]
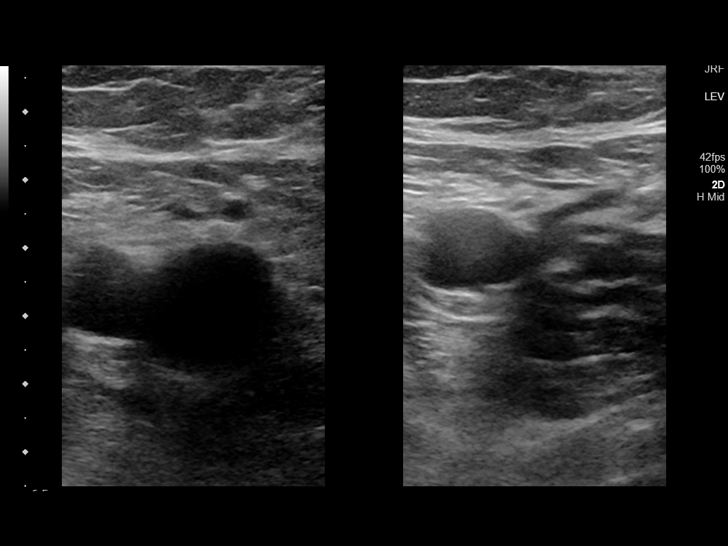
[im 4/44]
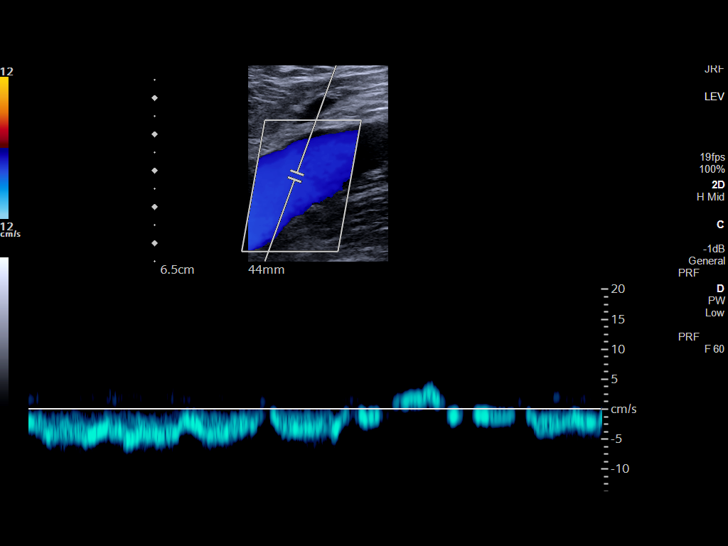
[im 8/44]
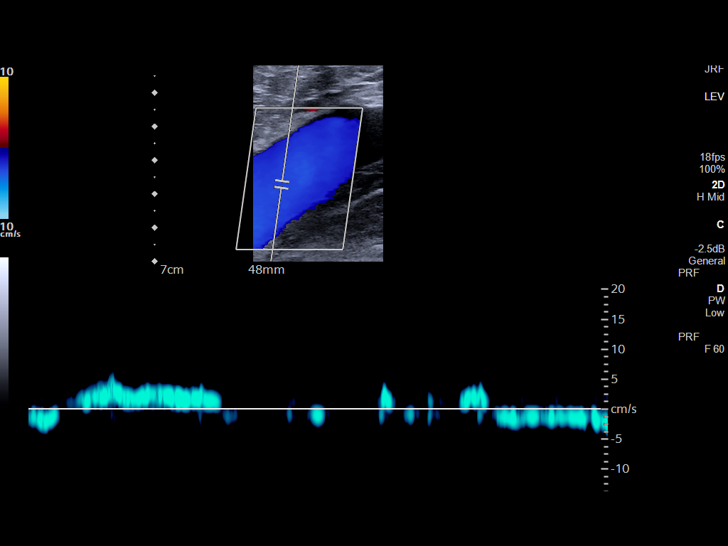
[im 12/44]
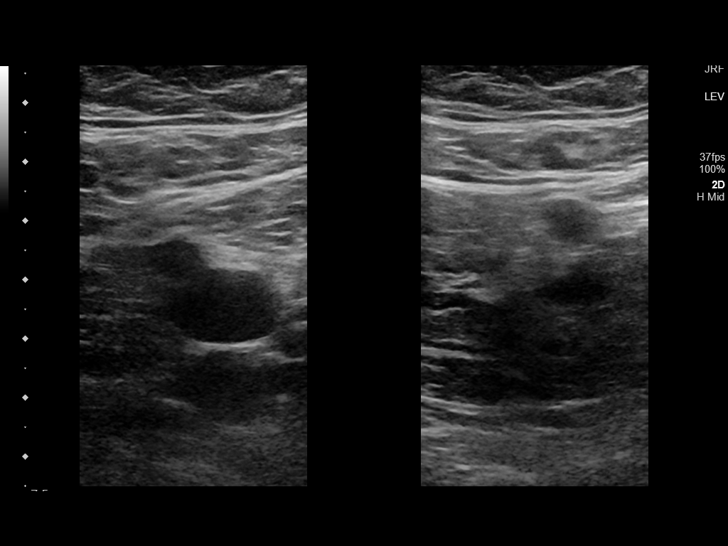
[im 15/44]
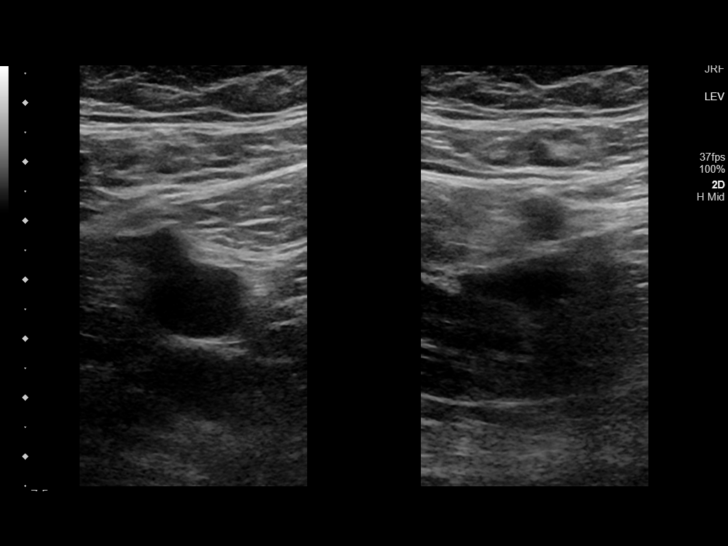
[im 19/44]
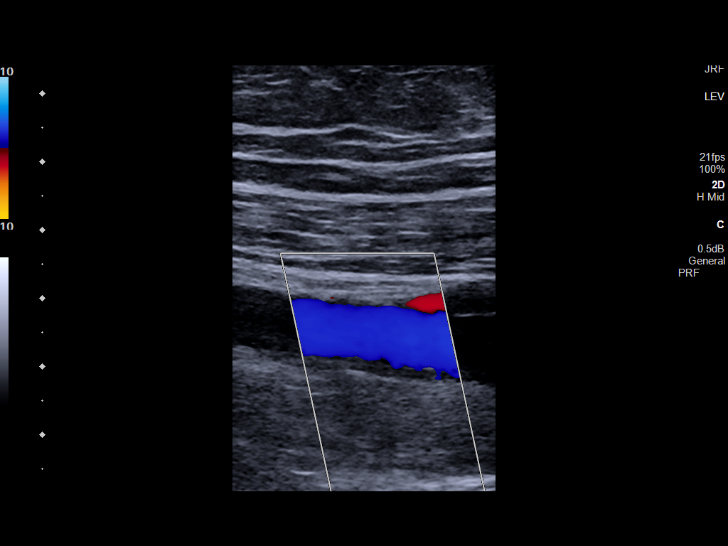
[im 23/44]
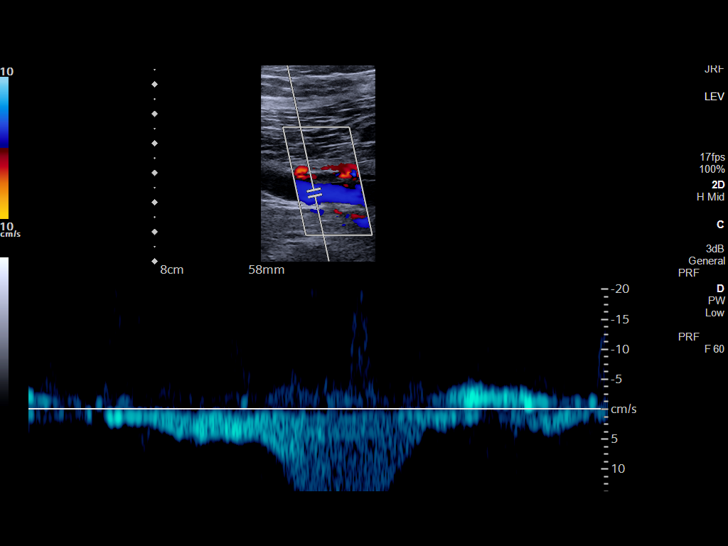
[im 25/44]
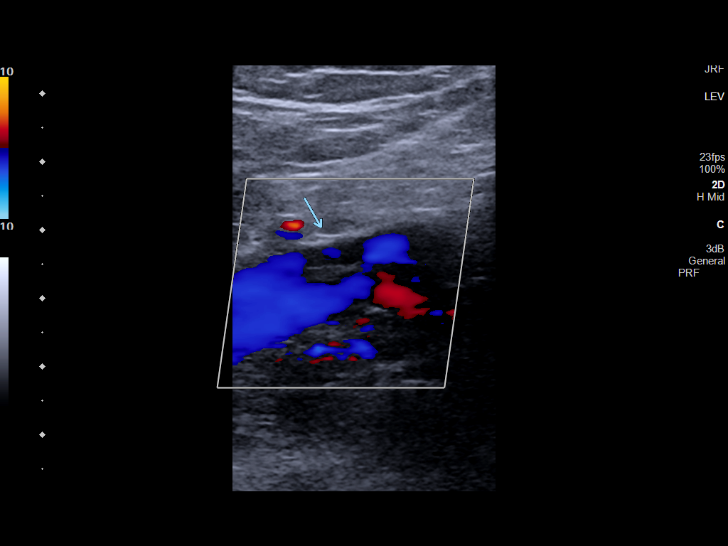
[im 29/44]
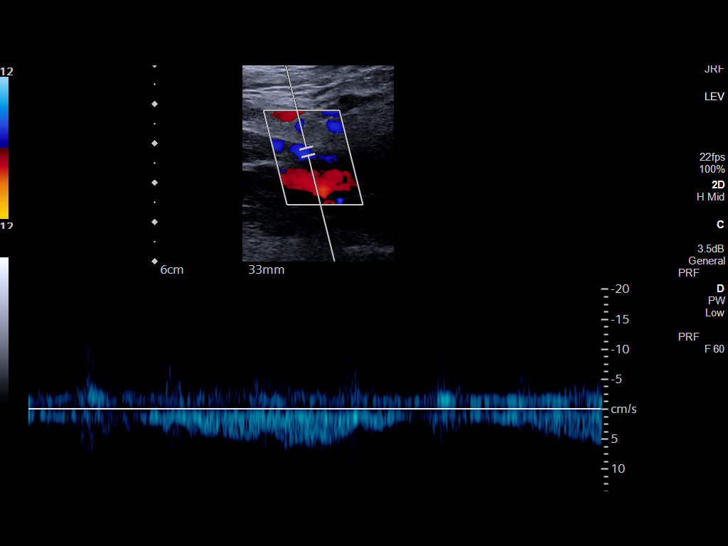
[im 32/44]
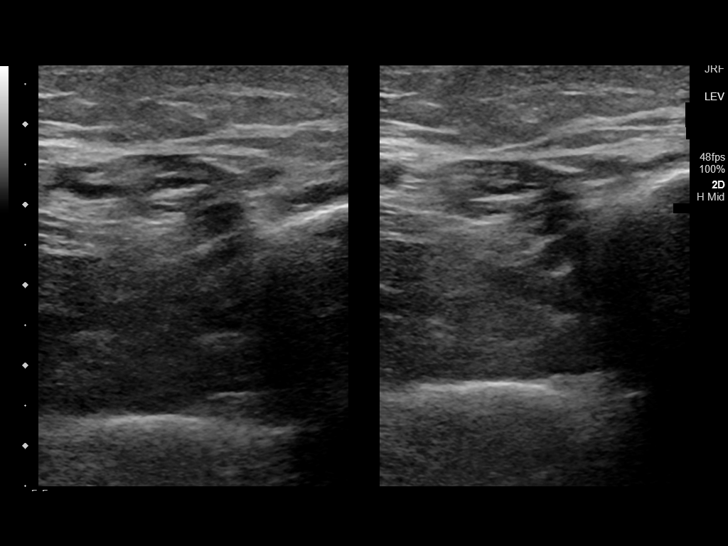
[im 36/44]
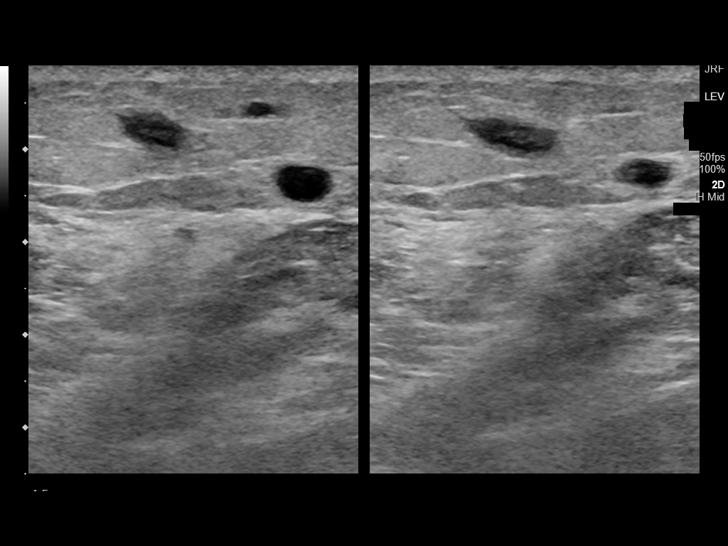
[im 40/44]
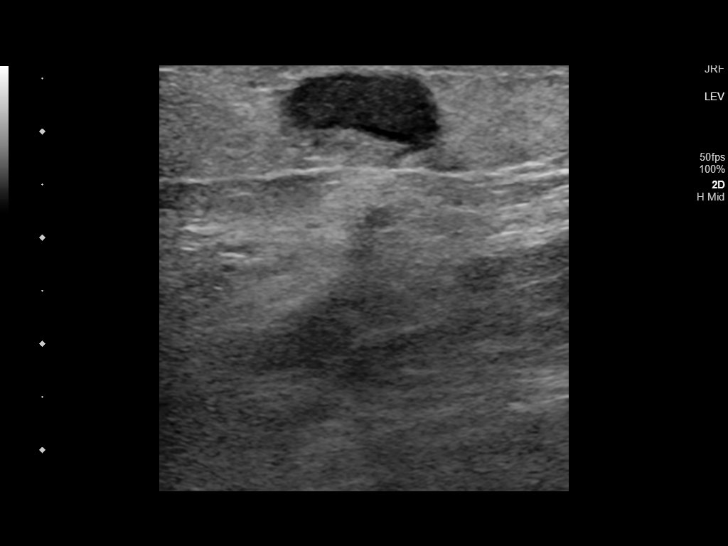
[im 44/44]
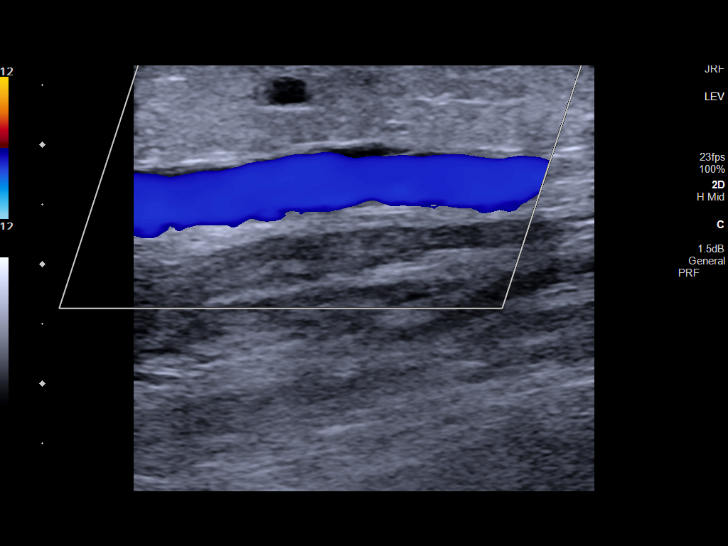

[13 of 24 positions shown; findings below may reference images not displayed]

FINDINGS: VENOUS

The left common femoral vein, junction with the greater saphenous
vein, left femoral vein, left profundus femoral vein, and visible
deep calf veins demonstrate preserved compressibility, color Doppler
flow and spectral Doppler augmentation. However, there is echogenic
thrombus in the left popliteal vein which is incompletely
compressible (images 25 and 26) with partial preservation of flow.

Furthermore, there are thrombosed superficial veins demonstrated in
the calf on images 37 through 39.

Limited views of the contralateral common femoral vein are
unremarkable.

OTHER

None.

Limitations: none
IMPRESSION: 1. Positive for nonocclusive thrombus of the left popliteal vein,
and superimposed left calf superficial venous thrombophlebitis.
2. No other left lower extremity DVT.

ADDENDUM:
Study discussed by telephone with Dr. Thakzen in the ED on 03/06/2020 at
4544 hours.

*** End of Addendum ***
FINDINGS: VENOUS

The left common femoral vein, junction with the greater saphenous
vein, left femoral vein, left profundus femoral vein, and visible
deep calf veins demonstrate preserved compressibility, color Doppler
flow and spectral Doppler augmentation. However, there is echogenic
thrombus in the left popliteal vein which is incompletely
compressible (images 25 and 26) with partial preservation of flow.

Furthermore, there are thrombosed superficial veins demonstrated in
the calf on images 37 through 39.

Limited views of the contralateral common femoral vein are
unremarkable.

OTHER

None.

Limitations: none
IMPRESSION: 1. Positive for nonocclusive thrombus of the left popliteal vein,
and superimposed left calf superficial venous thrombophlebitis.
2. No other left lower extremity DVT.

## 2021-12-13 IMAGING — CT CT ANGIO CHEST
2 of 6 series · 18 of 46 positions shown · IV contrast (APPLIED)
Comparison: None.

07/25/2019.

CLINICAL DATA: Shortness of breath.

EXAM:
CT ANGIOGRAPHY CHEST WITH CONTRAST
TECHNIQUE: Multidetector CT imaging of the chest was performed using the
standard protocol during bolus administration of intravenous
contrast. Multiplanar CT image reconstructions and MIPs were
obtained to evaluate the vascular anatomy.
CONTRAST:  100mL OMNIPAQUE IOHEXOL 350 MG/ML SOLN

[Series 5: thins · axial · 0.95mm/px · z∈[-571,-337]mm · 16 of 258 slices shown]
[im 12/258  lung]
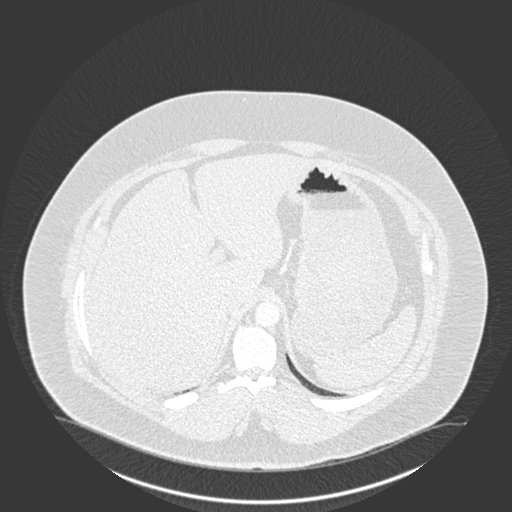
[im 34/258  soft-tissue]
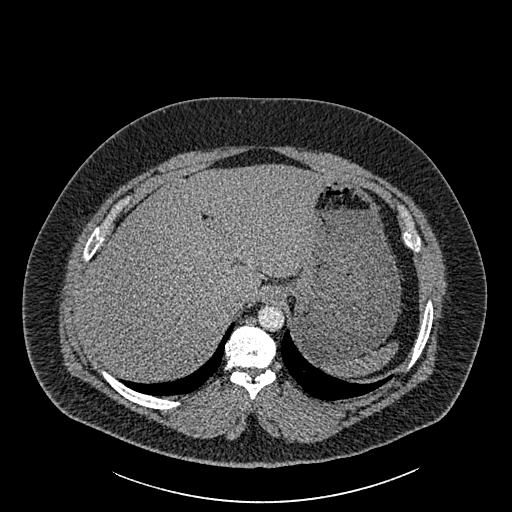
[im 45/258  lung]
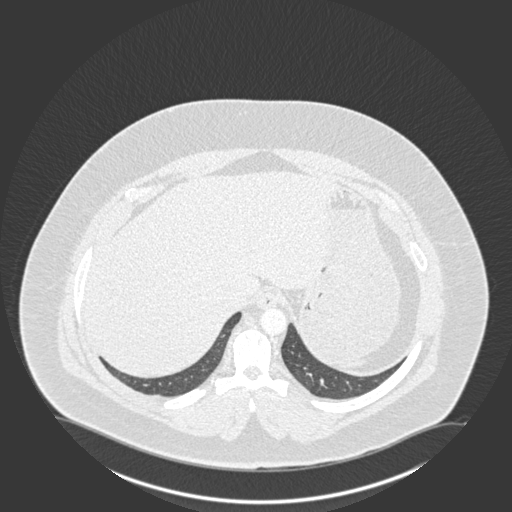
[im 56/258  soft-tissue]
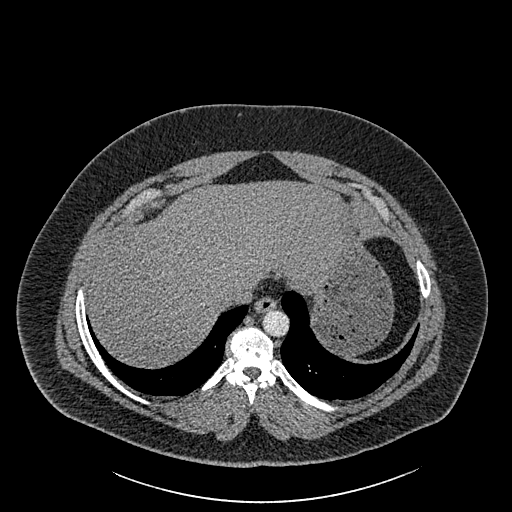
[im 79/258  lung]
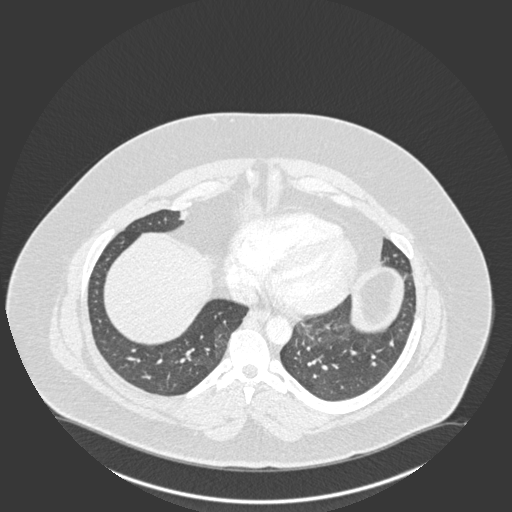
[im 90/258  soft-tissue]
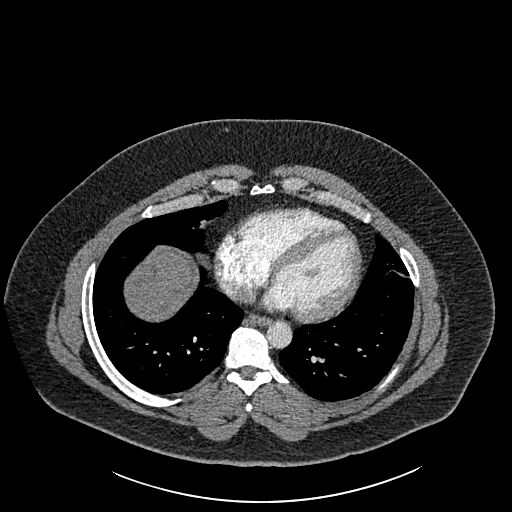
[im 101/258  lung]
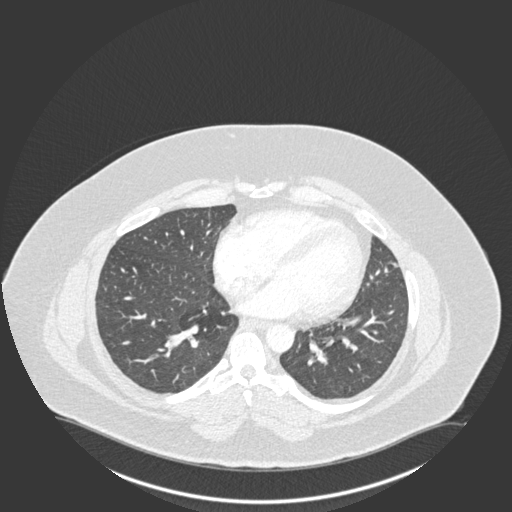
[im 123/258  soft-tissue]
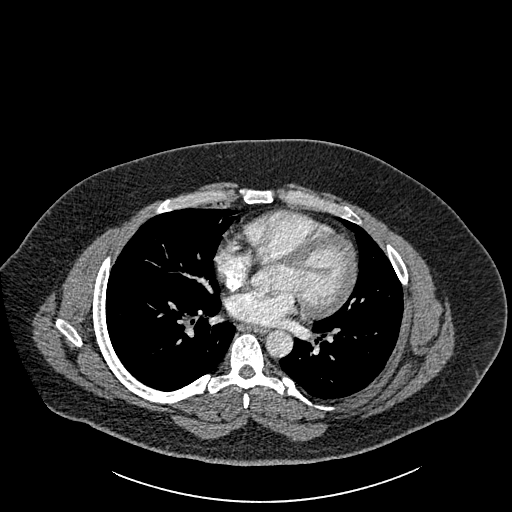
[im 135/258  lung]
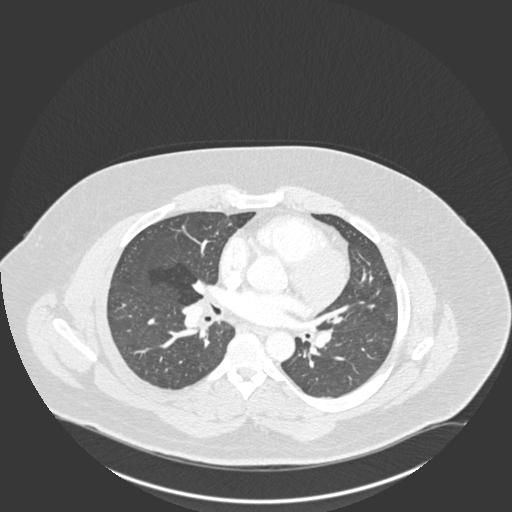
[im 157/258  soft-tissue]
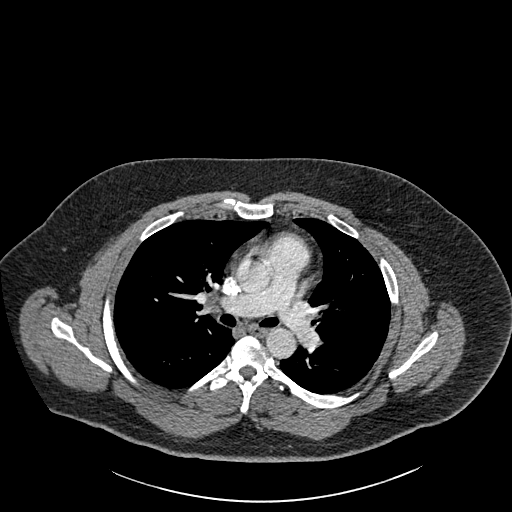
[im 168/258  lung]
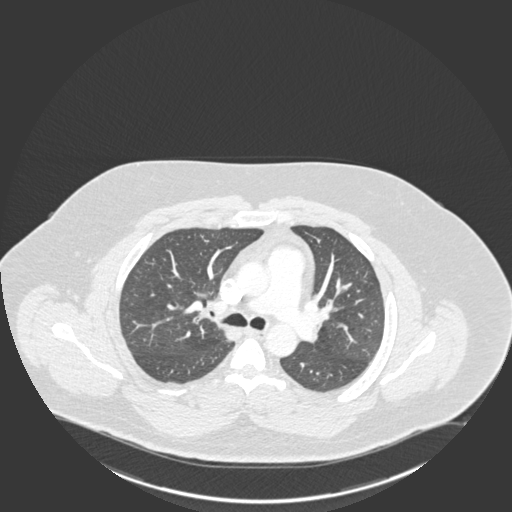
[im 179/258  soft-tissue]
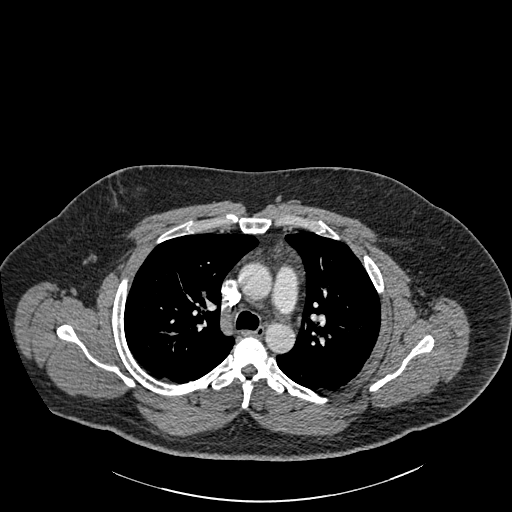
[im 202/258  lung]
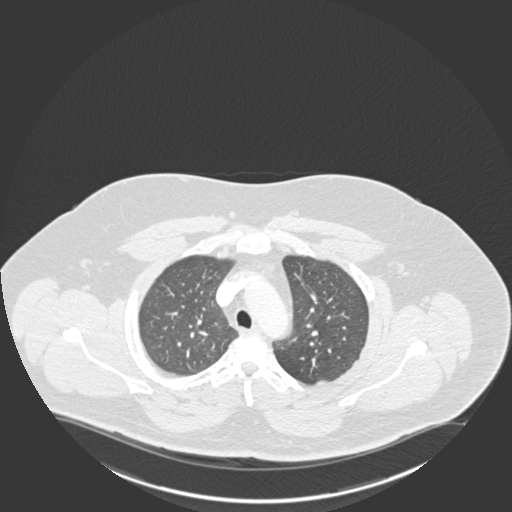
[im 213/258  soft-tissue]
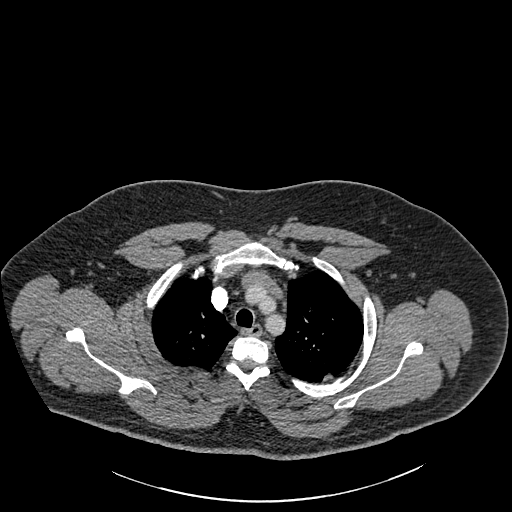
[im 224/258  lung]
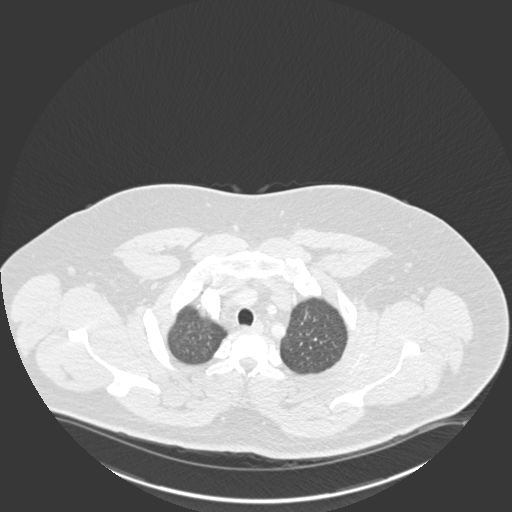
[im 246/258  soft-tissue]
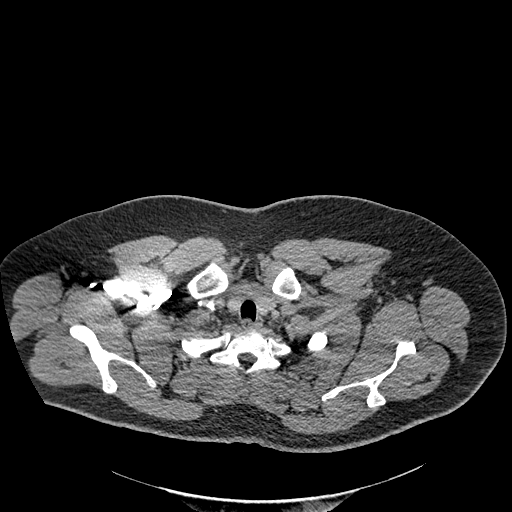

[Series 7: coronal mpr · coronal · 0.51mm/px · 2 of 101 slices shown]
[im 34/101  soft-tissue]
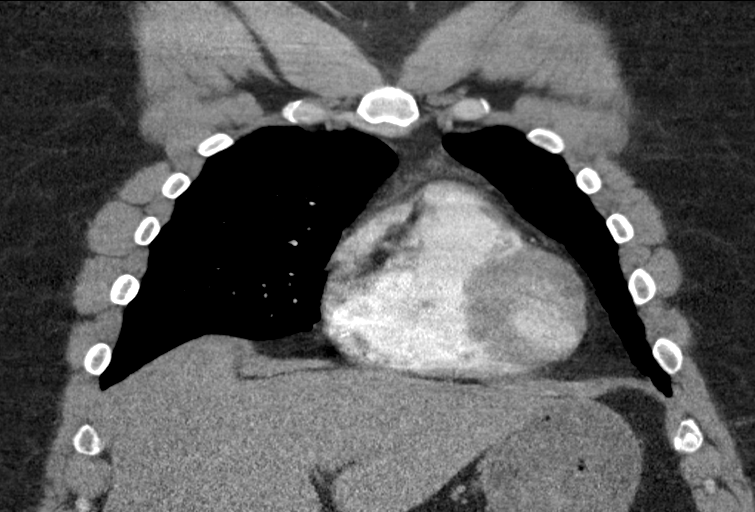
[im 67/101  soft-tissue]
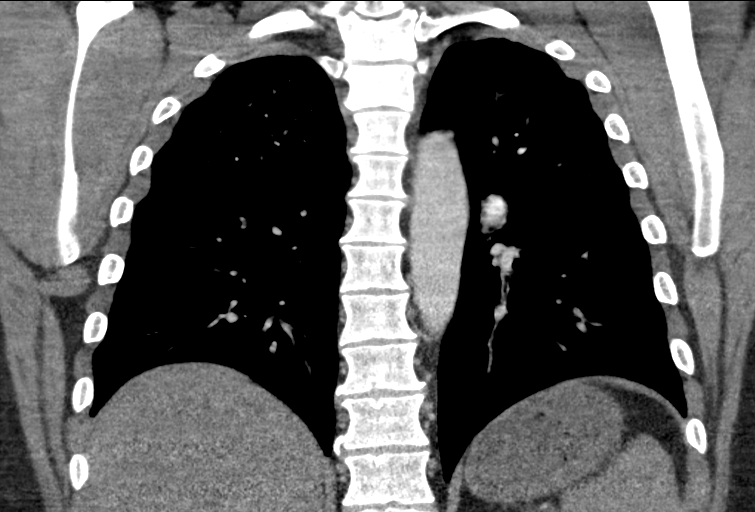

[18 of 46 positions shown; findings below may reference images not displayed]

FINDINGS: Cardiovascular: There is no evidence for an acute pulmonary
embolism, however evaluation is limited by respiratory motion
artifact and suboptimal contrast bolus timing. The heart size is
normal. There is no significant pericardial effusion. No evidence
for thoracic aortic dissection or aneurysm.

Mediastinum/Nodes: No enlarged mediastinal, hilar, or axillary lymph
nodes. Thyroid gland, trachea, and esophagus demonstrate no
significant findings.

Lungs/Pleura: Lungs are clear. No pleural effusion or pneumothorax.

Upper Abdomen: There is hepatic steatosis.

Musculoskeletal: No chest wall abnormality. No acute or significant
osseous findings.

Review of the MIP images confirms the above findings.
IMPRESSION: 1. No evidence for an acute pulmonary embolism, however evaluation
is limited by respiratory motion artifact and suboptimal contrast
bolus timing.
2. No acute intrathoracic process.
3. Hepatic steatosis.

## 2022-03-15 ENCOUNTER — Other Ambulatory Visit: Payer: Self-pay

## 2022-03-15 ENCOUNTER — Emergency Department
Admission: EM | Admit: 2022-03-15 | Discharge: 2022-03-15 | Disposition: A | Discharge status: Home / Self Care | Payer: 59 | Attending: Emergency Medicine | Admitting: Emergency Medicine

## 2022-03-15 ENCOUNTER — Emergency Department: Payer: 59

## 2022-03-15 DIAGNOSIS — R109 Unspecified abdominal pain: Secondary | ICD-10-CM | POA: Insufficient documentation

## 2022-03-15 DIAGNOSIS — R0602 Shortness of breath: Secondary | ICD-10-CM | POA: Diagnosis not present

## 2022-03-15 DIAGNOSIS — R079 Chest pain, unspecified: Secondary | ICD-10-CM | POA: Diagnosis not present

## 2022-03-15 DIAGNOSIS — R197 Diarrhea, unspecified: Secondary | ICD-10-CM | POA: Diagnosis not present

## 2022-03-15 DIAGNOSIS — R112 Nausea with vomiting, unspecified: Secondary | ICD-10-CM | POA: Diagnosis not present

## 2022-03-15 HISTORY — DX: Type 2 diabetes mellitus without complications: E11.9

## 2022-03-15 LAB — URINALYSIS, ROUTINE W REFLEX MICROSCOPIC
Bacteria, UA: NONE SEEN
Bilirubin Urine: NEGATIVE
Glucose, UA: NEGATIVE mg/dL
Hgb urine dipstick: NEGATIVE
Ketones, ur: 5 mg/dL — AB
Leukocytes,Ua: NEGATIVE
Nitrite: NEGATIVE
Protein, ur: 30 mg/dL — AB
Specific Gravity, Urine: 1.03 (ref 1.005–1.030)
pH: 5 (ref 5.0–8.0)

## 2022-03-15 LAB — COMPREHENSIVE METABOLIC PANEL
ALT: 34 U/L (ref 0–44)
AST: 18 U/L (ref 15–41)
Albumin: 3.7 g/dL (ref 3.5–5.0)
Alkaline Phosphatase: 76 U/L (ref 38–126)
Anion gap: 7 (ref 5–15)
BUN: 13 mg/dL (ref 6–20)
CO2: 25 mmol/L (ref 22–32)
Calcium: 9.1 mg/dL (ref 8.9–10.3)
Chloride: 104 mmol/L (ref 98–111)
Creatinine, Ser: 0.96 mg/dL (ref 0.61–1.24)
GFR, Estimated: >60 mL/min (ref >60)
Glucose, Bld: 181 mg/dL — ABNORMAL HIGH (ref 70–99)
Potassium: 3.8 mmol/L (ref 3.5–5.1)
Sodium: 136 mmol/L (ref 135–145)
Total Bilirubin: 0.8 mg/dL (ref 0.3–1.2)
Total Protein: 7.3 g/dL (ref 6.5–8.1)

## 2022-03-15 LAB — CBC
HCT: 49 % (ref 39.0–52.0)
Hemoglobin: 16 g/dL (ref 13.0–17.0)
MCH: 30.2 pg (ref 26.0–34.0)
MCHC: 32.7 g/dL (ref 30.0–36.0)
MCV: 92.6 fL (ref 80.0–100.0)
Platelets: 205 10*3/uL (ref 150–400)
RBC: 5.29 MIL/uL (ref 4.22–5.81)
RDW: 13 % (ref 11.5–15.5)
WBC: 9.3 10*3/uL (ref 4.0–10.5)
nRBC: 0 % (ref 0.0–0.2)

## 2022-03-15 LAB — TROPONIN I (HIGH SENSITIVITY): Troponin I (High Sensitivity): 6 ng/L (ref <18)

## 2022-03-15 LAB — LIPASE, BLOOD: Lipase: 37 U/L (ref 11–51)

## 2022-03-15 MED ORDER — ONDANSETRON HCL 4 MG/2ML IJ SOLN
4.0000 mg | Freq: Once | INTRAMUSCULAR | Status: AC
  Administered 2022-03-15: 4 mg via INTRAVENOUS
  Filled 2022-03-15: qty 2

## 2022-03-15 MED ORDER — SODIUM CHLORIDE 0.9 % IV BOLUS
1000.0000 mL | Freq: Once | INTRAVENOUS | Status: AC
Start: 2022-03-15 — End: 2022-03-15
  Administered 2022-03-15: 1000 mL via INTRAVENOUS

## 2022-03-15 MED ORDER — PANTOPRAZOLE SODIUM 40 MG PO TBEC: 40.0000 mg | DELAYED_RELEASE_TABLET | Freq: Every day | ORAL | 1 refills | Status: DC

## 2022-03-15 MED ORDER — MORPHINE SULFATE (PF) 4 MG/ML IV SOLN
4.0000 mg | Freq: Once | INTRAVENOUS | Status: AC
  Administered 2022-03-15: 4 mg via INTRAVENOUS
  Filled 2022-03-15: qty 1

## 2022-03-15 MED ORDER — OXYCODONE-ACETAMINOPHEN 5-325 MG PO TABS
1.0000 | ORAL_TABLET | ORAL | 0 refills | Status: AC | PRN
Start: 2022-03-15 — End: ?

## 2022-03-15 MED ORDER — IOHEXOL 350 MG/ML SOLN
100.0000 mL | Freq: Once | INTRAVENOUS | Status: AC | PRN
  Administered 2022-03-15: 100 mL via INTRAVENOUS

## 2022-03-15 NOTE — ED Triage Notes (Signed)
Pt states he started 3 days ago with abd pain, SHOB, and dizziness- pt states he has had diarrhea and nausea but no vomiting- pt was treated for DVT with filter placement here- states pain is umbilical and radiating to the R side

## 2022-03-15 NOTE — Discharge Instructions (Signed)
Please take your pain medication as needed, as prescribed.  Please take your Protonix/acid blocker medication each morning for the next 2 months.  Return to the emergency department for any return of/worsening abdominal pain, fever, or any other symptom personally concerning to yourself.

## 2022-03-15 NOTE — ED Provider Notes (Signed)
[facility] Provider Note    Event Date/Time   First MD Initiated Contact with Patient 03/15/22 1926     (approximate)  History   Chief Complaint: Abdominal Pain  HPI  Ricardo Wood is a 41 y.o. male with a past medical history of diabetes, DVT, kidney stones, presents to the emergency department for abdominal pain, nausea vomiting diarrhea chest pain and shortness of breath.  According to the patient for the past 3 days he has been experiencing abdominal pain nausea vomiting diarrhea.  He also states over the past day or 2 he has been having shortness of breath as well as some chest pain.  Patient states a history of a DVT with IVC filter, patient states he stopped taking his blood thinner months ago, because he no longer felt like taking this.  Patient noted to be tachycardic in the emergency department around 130 bpm.  States some mild shortness of breath at rest.  Physical Exam   Triage Vital Signs: ED Triage Vitals  Enc Vitals Group     BP 03/15/22 1846 (!) 147/81     Pulse Rate 03/15/22 1846 (!) 127     Resp 03/15/22 1846 20     Temp 03/15/22 1845 98.9 F (37.2 C)     Temp Source 03/15/22 1845 Oral     SpO2 03/15/22 1846 94 %     Weight 03/15/22 1844 (!) 310 lb (140.6 kg)     Height 03/15/22 1844 6' (1.829 m)     Head Circumference --      Peak Flow --      Pain Score 03/15/22 1844 8     Pain Loc --      Pain Edu? --      Excl. in GC? --     Most recent vital signs: Vitals:   03/15/22 1845 03/15/22 1846  BP:  (!) 147/81  Pulse:  (!) 127  Resp:  20  Temp: 98.9 F (37.2 C)   SpO2:  94%    General: Awake, no distress.  CV:  Good peripheral perfusion.  Regular rate and rhythm  Resp:  Normal effort.  Equal breath sounds bilaterally.  Abd:  Soft, moderate tenderness along the right side of the abdomen.  No rebound or guarding. Other:  No significant lower extremity edema or tenderness.   ED Results / Procedures / Treatments    EKG  EKG viewed and interpreted by myself shows sinus tachycardia 135 bpm with a narrow QRS, normal axis, normal intervals, nonspecific ST changes.  RADIOLOGY  I have interpreted the CT images no large saddle emboli seen on my evaluation. Radiology is read the CTA is negative for PE, largely unchanged from prior.  CT abdomen/pelvis shows no acute findings.   MEDICATIONS ORDERED IN ED: Medications - No data to display   IMPRESSION / MDM / ASSESSMENT AND PLAN / ED COURSE  I reviewed the triage vital signs and the nursing notes.  Patient's presentation is most consistent with acute presentation with potential threat to life or bodily function.  Patient presents to the emergency department for chest pain shortness of breath right-sided abdominal pain nausea vomiting and diarrhea symptoms over the past 3 days.  Given the patient's history of prior DVT IVC filter now off anticoagulation as well as being tachycardic we will obtain CTA imaging of the chest to rule out pulmonary embolism, we will extend the CT through the abdomen/pelvis to evaluate for infectious etiology such as gallbladder,  pancreas, colitis or diverticulitis, UTI pyelonephritis or ureterolithiasis.  Patient agreeable to plan.  We will treat pain nausea and IV hydrate while awaiting results.  Patient's work-up is overall reassuring.  CMP is normal including normal LFTs and renal function.  Lipase is normal, CBC is normal including a normal white blood cell count.  Urinalysis shows no concerning findings.  CT scan of the abdomen/pelvis is normal, CTA of the chest is negative for PE.  Given the patient's reassuring work-up I believe the patient would be safe for discharge home.  We will place the patient on a very short course of pain medication have the patient follow-up with his doctor.  Discussed return precautions.  Patient agreeable to plan of care.  FINAL CLINICAL IMPRESSION(S) / ED DIAGNOSES   Chest pain Abdominal  pain  Rx / DC Orders   Percocet x8  Note:  This document was prepared using Dragon voice recognition software and may include unintentional dictation errors.   Minna Antis, MD 03/15/22 2209

## 2022-03-26 IMAGING — CR DG LUMBAR SPINE 2-3V
1 series · 3 of 3 positions shown · non-contrast
Comparison: February 25, 2012

CLINICAL DATA: Back pain.

EXAM:
LUMBAR SPINE - 2-3 VIEW

[Series 1: dg lumbar spine 2-3 views · 0.14mm/px · 3 of 3 slices shown]
[im 1/3]
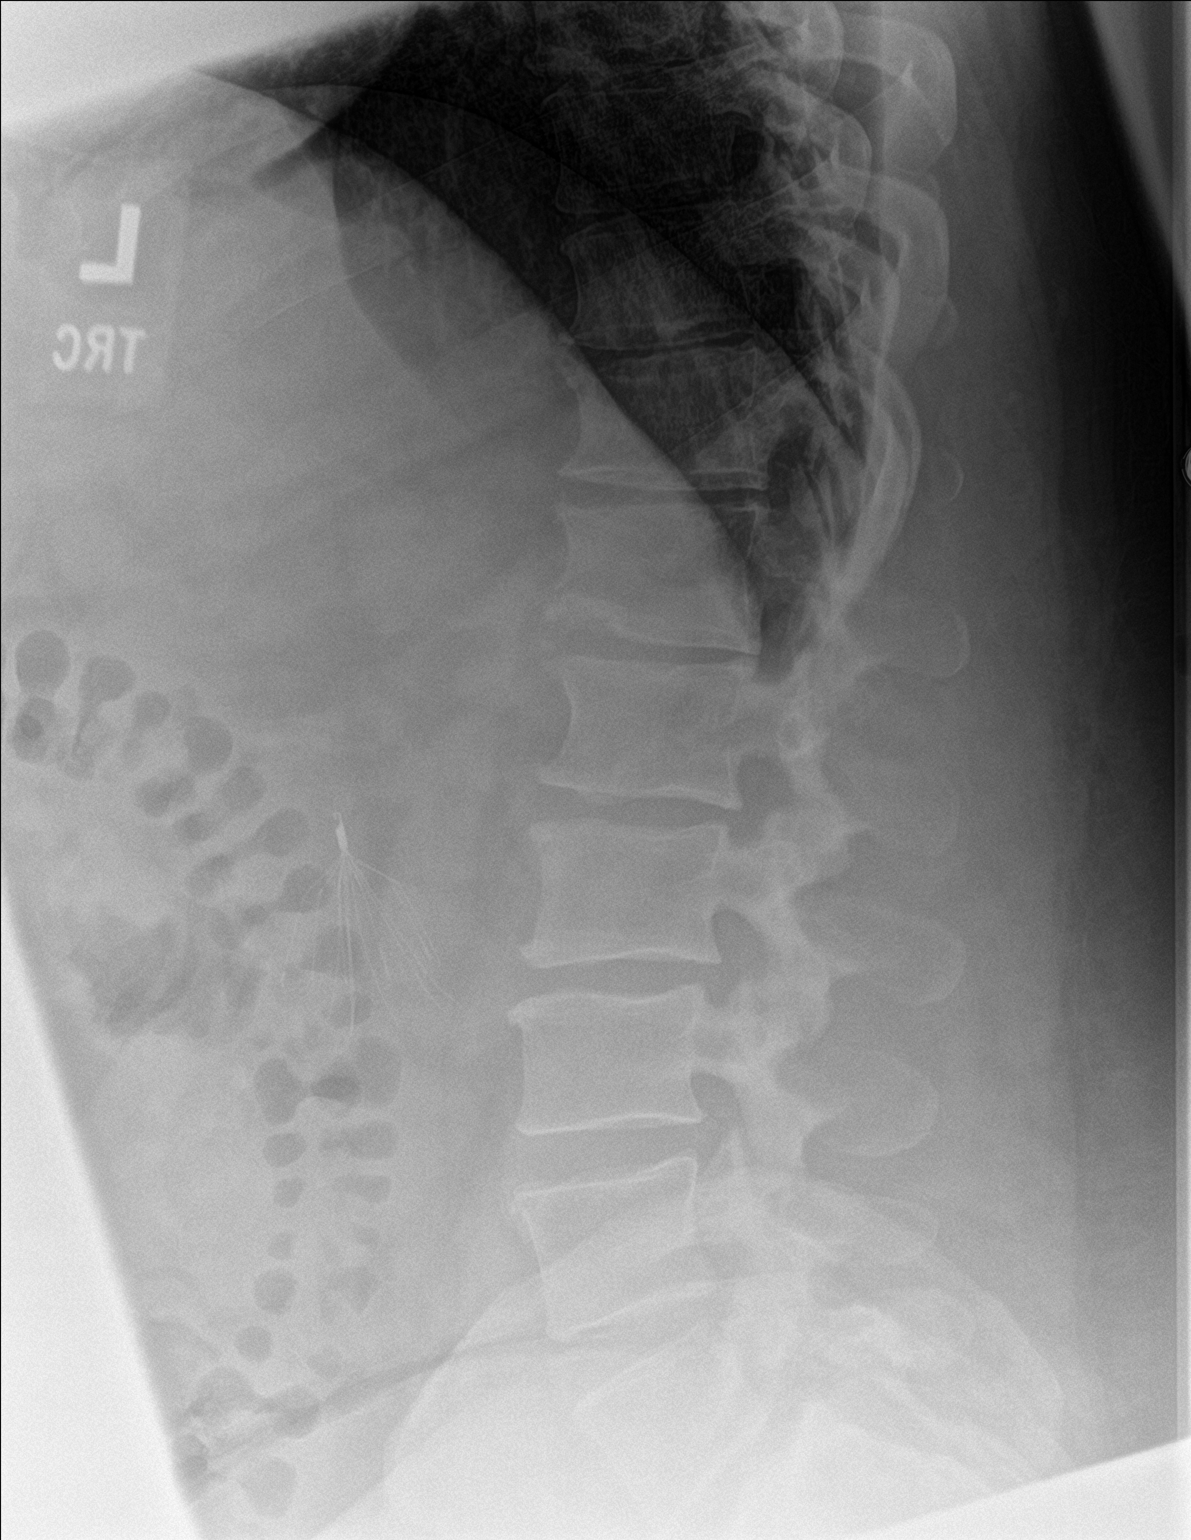
[im 2/3]
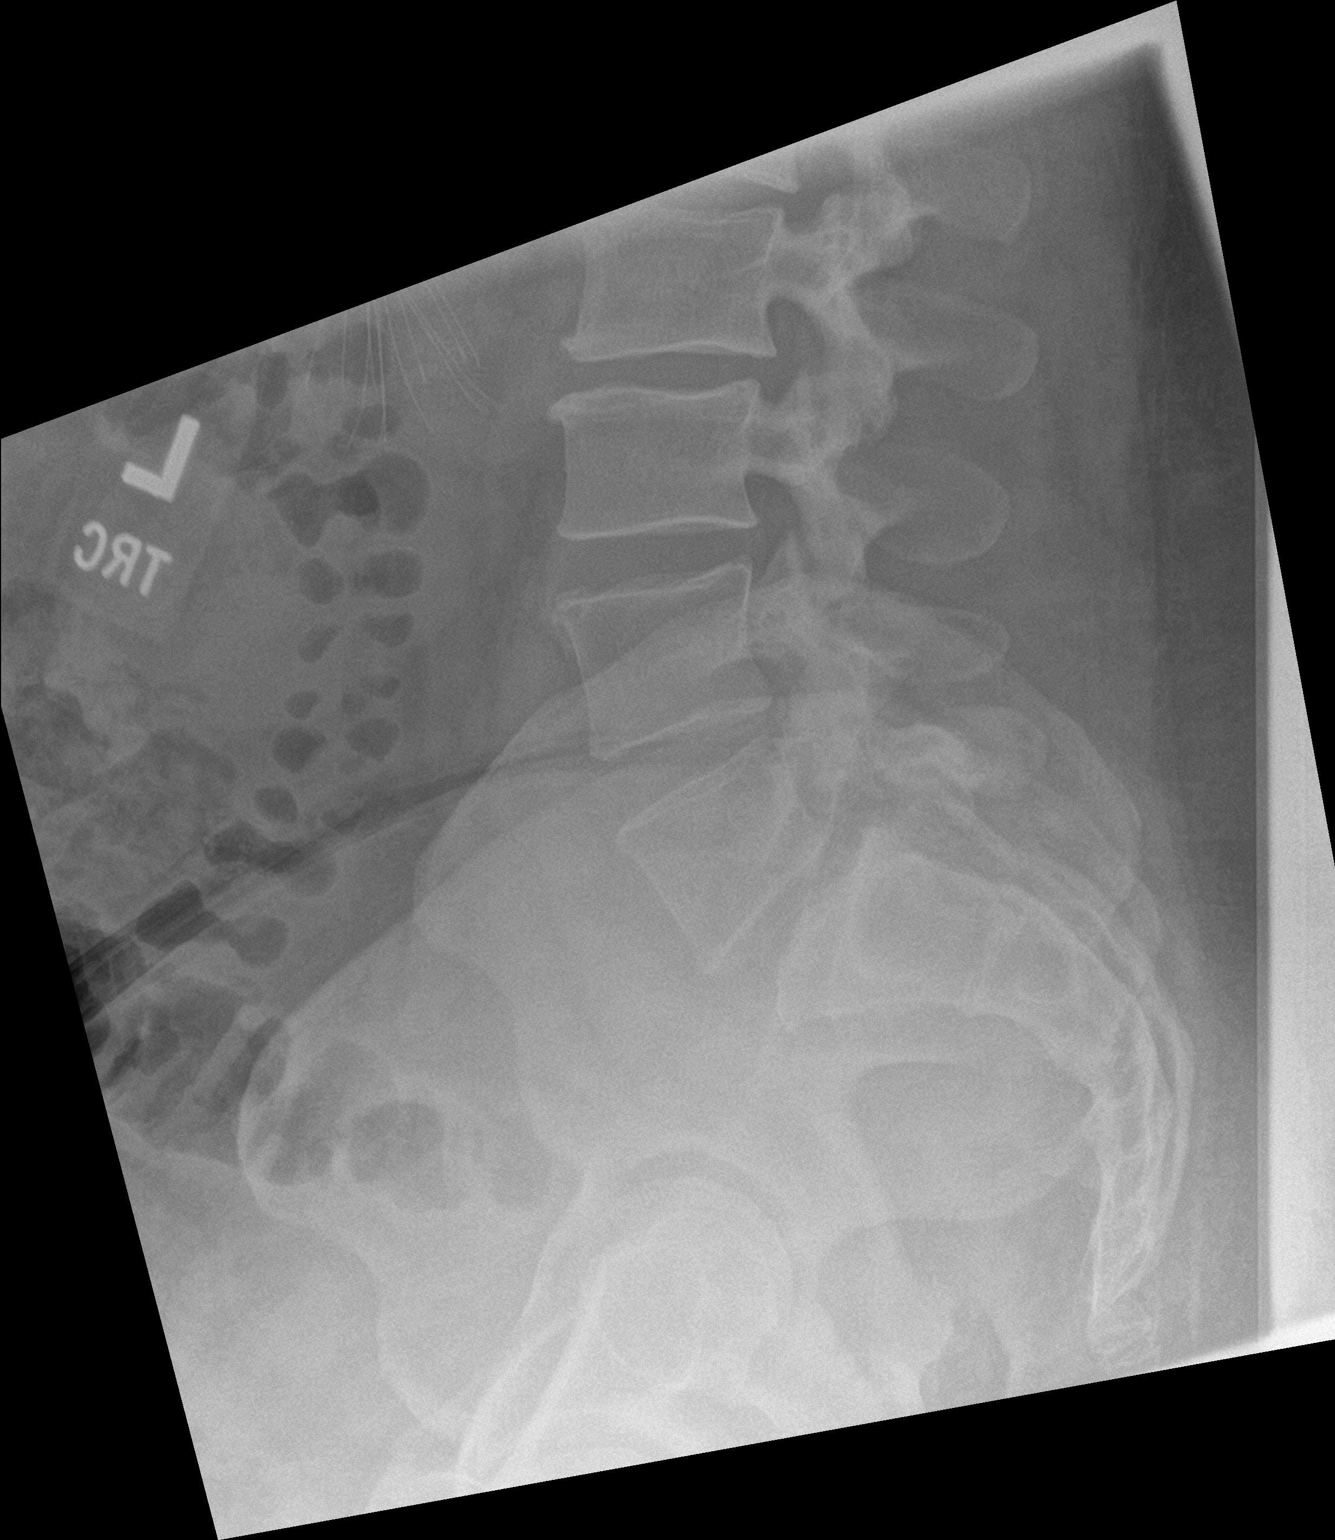
[im 3/3]
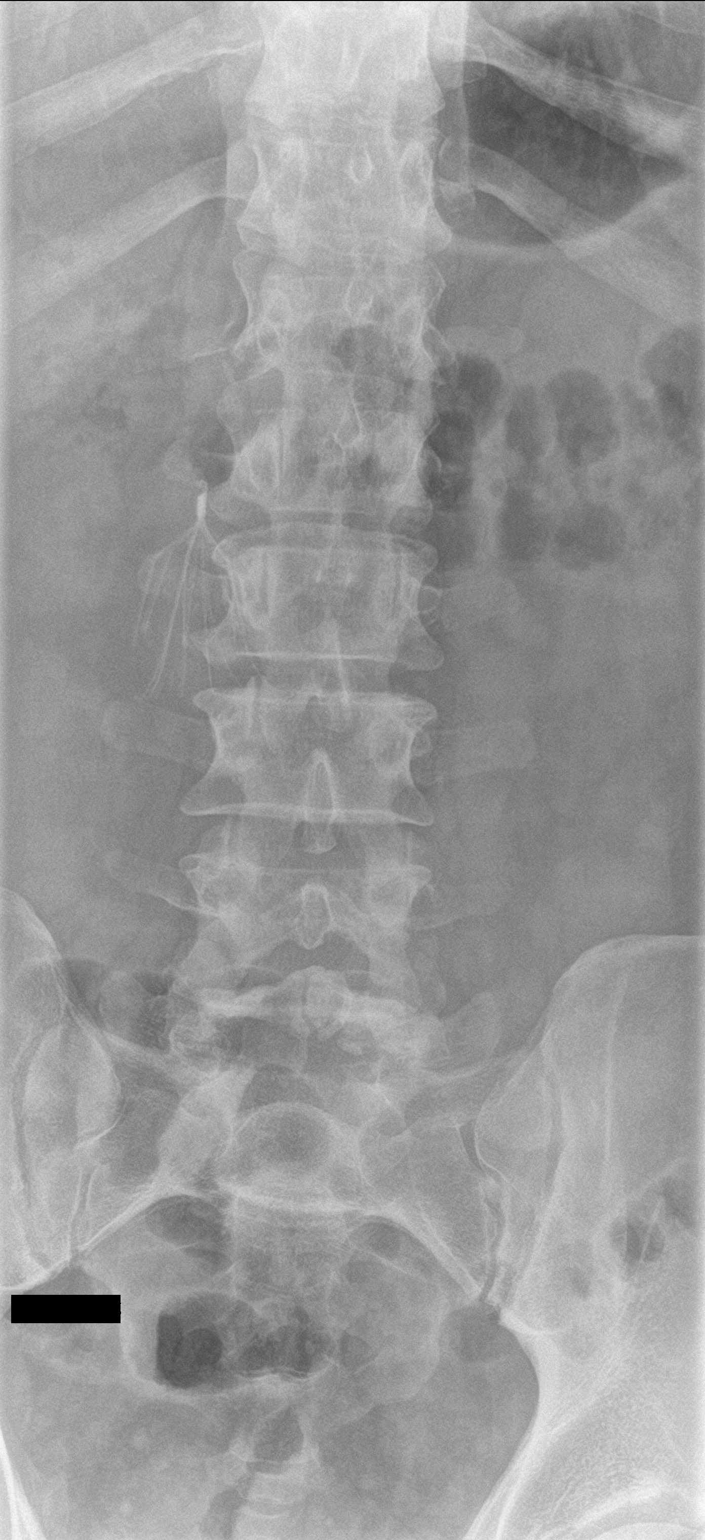

[3 of 3 positions shown; findings below may reference images not displayed]

FINDINGS: There is no evidence of an acute lumbar spine fracture. A chronic
deformity is seen along the anterior aspect of the inferior endplate
of the T12 vertebral body. Alignment is normal. Intervertebral disc
spaces are maintained. An inferior vena cava filter is noted.
IMPRESSION: 1. No evidence of acute lumbar spine fracture or subluxation.
2. Inferior vena cava filter.
3. Chronic deformity involving the inferior endplate of the T12
vertebral body which may represent a Schmorl's node

## 2023-03-04 ENCOUNTER — Encounter: Payer: Self-pay | Admitting: Emergency Medicine

## 2023-03-04 ENCOUNTER — Emergency Department
Admission: EM | Admit: 2023-03-04 | Discharge: 2023-03-05 | Disposition: A | Discharge status: Home / Self Care | Payer: Medicaid Other | Attending: Emergency Medicine | Admitting: Emergency Medicine

## 2023-03-04 ENCOUNTER — Other Ambulatory Visit: Payer: Self-pay

## 2023-03-04 DIAGNOSIS — Z7901 Long term (current) use of anticoagulants: Secondary | ICD-10-CM | POA: Insufficient documentation

## 2023-03-04 DIAGNOSIS — T7840XA Allergy, unspecified, initial encounter: Secondary | ICD-10-CM | POA: Diagnosis not present

## 2023-03-04 DIAGNOSIS — R220 Localized swelling, mass and lump, head: Secondary | ICD-10-CM | POA: Diagnosis present

## 2023-03-04 DIAGNOSIS — Z7984 Long term (current) use of oral hypoglycemic drugs: Secondary | ICD-10-CM | POA: Insufficient documentation

## 2023-03-04 DIAGNOSIS — E119 Type 2 diabetes mellitus without complications: Secondary | ICD-10-CM | POA: Diagnosis not present

## 2023-03-04 DIAGNOSIS — Z7951 Long term (current) use of inhaled steroids: Secondary | ICD-10-CM | POA: Diagnosis not present

## 2023-03-04 DIAGNOSIS — J449 Chronic obstructive pulmonary disease, unspecified: Secondary | ICD-10-CM | POA: Insufficient documentation

## 2023-03-04 DIAGNOSIS — T783XXA Angioneurotic edema, initial encounter: Secondary | ICD-10-CM | POA: Diagnosis not present

## 2023-03-04 MED ORDER — DIPHENHYDRAMINE HCL 50 MG/ML IJ SOLN
25.0000 mg | Freq: Once | INTRAMUSCULAR | Status: AC
  Administered 2023-03-04: 25 mg via INTRAVENOUS
  Filled 2023-03-04: qty 1

## 2023-03-04 MED ORDER — EPINEPHRINE 0.3 MG/0.3ML IJ SOAJ
0.3000 mg | Freq: Once | INTRAMUSCULAR | Status: AC
  Administered 2023-03-04: 0.3 mg via INTRAMUSCULAR
  Filled 2023-03-04: qty 0.3

## 2023-03-04 MED ORDER — METHYLPREDNISOLONE SODIUM SUCC 125 MG IJ SOLR
125.0000 mg | Freq: Once | INTRAMUSCULAR | Status: AC
  Administered 2023-03-04: 125 mg via INTRAVENOUS
  Filled 2023-03-04: qty 2

## 2023-03-04 MED ORDER — SODIUM CHLORIDE 0.9 % IV BOLUS
1000.0000 mL | Freq: Once | INTRAVENOUS | Status: AC
  Administered 2023-03-04: 1000 mL via INTRAVENOUS

## 2023-03-04 MED ORDER — FAMOTIDINE IN NACL 20-0.9 MG/50ML-% IV SOLN
20.0000 mg | Freq: Once | INTRAVENOUS | Status: AC
  Administered 2023-03-04: 20 mg via INTRAVENOUS
  Filled 2023-03-04: qty 50

## 2023-03-04 NOTE — ED Provider Notes (Signed)
[facility] Provider Note    Event Date/Time   First MD Initiated Contact with Patient 03/04/23 2318     (approximate)   History   Allergic Reaction   HPI  Ricardo Wood is a 42 y.o. male who presents to the ED from home with a chief complaint of lower lip swelling.  Patient reports swelling of lower lip which started this evening without known cause.  Denies taking ACE inhibitor; in fact, he has not been taking any of his medications including Eliquis for DVT due to starting a new job and having no insurance for the first 9 months of his job.  Did not take any medicines prior to arrival.  Denies OTC medications, new foods, toothpaste, environmental exposures, etc.  Denies associated chest pain, shortness of breath, abdominal pain, nausea, vomiting, dizziness or rash.     Past Medical History   Past Medical History:  Diagnosis Date   Diabetes mellitus without complication (HCC)    DVT (deep venous thrombosis) (HCC)    Kidney stones    Seizures [facility])      Active Problem List   Patient Active Problem List   Diagnosis Date Noted   Lymphedema 12/11/2020   COPD (chronic obstructive pulmonary disease) (HCC) 12/11/2020   Chronic deep vein thrombosis (DVT) (HCC) 10/14/2020   Chest pain 07/25/2019     Past Surgical History   Past Surgical History:  Procedure Laterality Date   dvt filter       Home Medications   Prior to Admission medications   Medication Sig Start Date End Date Taking? Authorizing Provider  albuterol (VENTOLIN HFA) 108 (90 Base) MCG/ACT inhaler Inhale 2 puffs into the lungs every 6 (six) hours as needed for wheezing or shortness of breath. 08/26/21   Nita Sickle, MD  chlorhexidine (PERIDEX) 0.12 % solution Use as directed 15 mLs in the mouth or throat 4 (four) times daily. 10/19/21   Nita Sickle, MD  ELIQUIS 5 MG TABS tablet  08/08/20   [provider]  hydrocortisone 2.5 % lotion Apply topically 2  (two) times daily. 01/31/20   Triplett, Rulon Eisenmenger B, FNP  hydrOXYzine (ATARAX/VISTARIL) 25 MG tablet Take 1 tablet (25 mg total) by mouth 3 (three) times daily as needed for itching. 01/31/20   Triplett, Cari B, FNP  lidocaine (XYLOCAINE) 2 % solution Use as directed 10 mLs in the mouth or throat as needed. Patient not taking: Reported on 10/17/2020 10/06/19   Enid Derry, PA-C  magic mouthwash w/lidocaine SOLN Take 10 mLs by mouth 4 (four) times daily as needed for mouth pain. Patient not taking: Reported on 10/17/2020 01/31/20   Kem Boroughs B, FNP  metFORMIN (GLUCOPHAGE) 500 MG tablet Take 1 tablet (500 mg total) by mouth 2 (two) times daily with a meal. 08/26/21 08/26/22  Don Perking, [place], MD  oxyCODONE-acetaminophen (PERCOCET) 5-325 MG tablet Take 1 tablet by mouth every 4 (four) hours as needed for severe pain. 03/15/22   Minna Antis, MD  pantoprazole (PROTONIX) 40 MG tablet Take 1 tablet (40 mg total) by mouth daily. 03/15/22 03/15/23  Minna Antis, MD  RIVAROXABAN Carlena Hurl) VTE STARTER PACK (15 & 20 MG TABLETS) Follow package directions: Take one 15mg  tablet by mouth twice a day. On day 22, switch to one 20mg  tablet once a day. Take with food. 06/17/20   Gilles Chiquito, MD  SPIRIVA HANDIHALER 18 MCG inhalation capsule  08/08/20   [provider]  SYMBICORT 160-4.5 MCG/ACT inhaler  08/08/20  [provider]     Allergies  Onion   Family History  History reviewed. No pertinent family history.   Physical Exam  Triage Vital Signs: ED Triage Vitals  Enc Vitals Group     BP      Pulse      Resp      Temp      Temp src      SpO2      Weight      Height      Head Circumference      Peak Flow      Pain Score      Pain Loc      Pain Edu?      Excl. in GC?     Updated Vital Signs: BP (!) 155/104 (BP Location: Right Wrist)   Pulse 85   Temp 98.4 F (36.9 C) (Oral)   Resp 20   Ht 6' (1.829 m)   Wt (!) 149.7 kg   SpO2 97%   BMI 44.76 kg/m     General: Awake, no distress.  Pleasant and talkative. CV:  RRR.  Good peripheral perfusion.  Resp:  Normal effort.  CTAB. Abd:  Nontender.  No distention.  Other:  Lower lip with moderate uniform swelling.  Posterior oropharynx is clear.  Phonation normal.  There is no hoarse or muffled voice.  There is no drooling.  Tolerating secretions well.  No palpable neck masses.   ED Results / Procedures / Treatments  Labs (all labs ordered are listed, but only abnormal results are displayed) Labs Reviewed - No data to display   EKG  None   RADIOLOGY None   Official radiology report(s): No results found.   PROCEDURES:  Critical Care performed: Yes, see critical care procedure note(s)  CRITICAL CARE Performed by: Irean Hong   Total critical care time: 45 minutes  Critical care time was exclusive of separately billable procedures and treating other patients.  Critical care was necessary to treat or prevent imminent or life-threatening deterioration.  Critical care was time spent personally by me on the following activities: development of treatment plan with patient and/or surrogate as well as nursing, discussions with consultants, evaluation of patient's response to treatment, examination of patient, obtaining history from patient or surrogate, ordering and performing treatments and interventions, ordering and review of laboratory studies, ordering and review of radiographic studies, pulse oximetry and re-evaluation of patient's condition.   Marland Kitchen1-3 Lead EKG Interpretation  Performed by: Irean Hong, MD Authorized by: Irean Hong, MD     Interpretation: normal     ECG rate:  85   ECG rate assessment: normal     Rhythm: sinus rhythm     Ectopy: none     Conduction: normal   Comments:     Patient placed on cardiac monitor to evaluate for arrhythmias    MEDICATIONS ORDERED IN ED: Medications  sodium chloride 0.9 % bolus 1,000 mL (0 mLs Intravenous Stopped 03/05/23  0132)  EPINEPHrine (EPI-PEN) injection 0.3 mg (0.3 mg Intramuscular Given 03/04/23 2345)  diphenhydrAMINE (BENADRYL) injection 25 mg (25 mg Intravenous Given 03/04/23 2342)  methylPREDNISolone sodium succinate (SOLU-MEDROL) 125 mg/2 mL injection 125 mg (125 mg Intravenous Given 03/04/23 2340)  famotidine (PEPCID) IVPB 20 mg premix (0 mg Intravenous Stopped 03/05/23 0132)  dexamethasone (DECADRON) injection 10 mg (10 mg Intravenous Given 03/05/23 0134)     IMPRESSION / MDM / ASSESSMENT AND PLAN / ED COURSE  I reviewed the triage vital  signs and the nursing notes.                             42 year old male presenting with angioedema.  Will administer allergic reaction cocktail consisting of EpiPen, IV fluids, IV Benadryl, IV Solu-Medrol and IV Pepcid.  Will continue to monitor patient closely.  Patient's presentation is most consistent with acute presentation with potential threat to life or bodily function.  The patient is on the cardiac monitor to evaluate for evidence of arrhythmia and/or significant heart rate changes.   Clinical Course as of 03/05/23 0246  Fri Mar 05, 2023  0104 Left side improved, right side slowly improving.  Will add Decadron. [JS]  0245 Right side also improved.  Mild swelling remains.  Will discharge home with 4 additional days of Prednisone and Pepcid, EpiPen.  Encouraged Benadryl as needed.  Strict return precautions given.  Patient verbalizes understanding and agrees with plan of care. [JS]    Clinical Course User Index [JS] Irean Hong, MD     FINAL CLINICAL IMPRESSION(S) / ED DIAGNOSES   Final diagnoses:  Allergic reaction, initial encounter  Angioedema, initial encounter     Rx / DC Orders   ED Discharge Orders     None        Note:  This document was prepared using Dragon voice recognition software and may include unintentional dictation errors.   Irean Hong, MD 03/05/23 [phone]

## 2023-03-04 NOTE — ED Triage Notes (Signed)
Patient ambulatory to triage with steady gait, without difficulty or distress noted; pt reports lower lip swelling tonight with no known cause; denies any rashes/itching; denies any SHOB or diff swallowing; no meds taken PTA

## 2023-03-05 DIAGNOSIS — T783XXA Angioneurotic edema, initial encounter: Secondary | ICD-10-CM | POA: Diagnosis not present

## 2023-03-05 MED ORDER — PREDNISONE 20 MG PO TABS: ORAL_TABLET | ORAL | 0 refills | Status: DC

## 2023-03-05 MED ORDER — EPINEPHRINE 0.3 MG/0.3ML IJ SOAJ: 0.3000 mg | Freq: Once | INTRAMUSCULAR | 1 refills | Status: AC

## 2023-03-05 MED ORDER — FAMOTIDINE 20 MG PO TABS: 20.0000 mg | ORAL_TABLET | Freq: Two times a day (BID) | ORAL | 0 refills | Status: DC

## 2023-03-05 MED ORDER — DEXAMETHASONE SODIUM PHOSPHATE 10 MG/ML IJ SOLN
10.0000 mg | Freq: Once | INTRAMUSCULAR | Status: AC
  Administered 2023-03-05: 10 mg via INTRAVENOUS
  Filled 2023-03-05: qty 1

## 2023-03-05 NOTE — Discharge Instructions (Signed)
1. Take the following medicines for the next 4 days: Prednisone 60mg daily Pepcid 20mg twice daily 2. Take Benadryl as needed for itching. 3. Use Epi-Pen in case of acute, life-threatening allergic reaction. 4. Return to the ER for worsening symptoms, persistent vomiting, difficulty breathing or other concerns.  

## 2023-03-10 ENCOUNTER — Other Ambulatory Visit: Payer: Self-pay

## 2023-03-10 ENCOUNTER — Emergency Department
Admission: EM | Admit: 2023-03-10 | Discharge: 2023-03-11 | Disposition: A | Discharge status: Home / Self Care | Payer: 59 | Attending: Emergency Medicine | Admitting: Emergency Medicine

## 2023-03-10 DIAGNOSIS — J449 Chronic obstructive pulmonary disease, unspecified: Secondary | ICD-10-CM | POA: Diagnosis not present

## 2023-03-10 DIAGNOSIS — K029 Dental caries, unspecified: Secondary | ICD-10-CM | POA: Diagnosis not present

## 2023-03-10 DIAGNOSIS — X58XXXA Exposure to other specified factors, initial encounter: Secondary | ICD-10-CM | POA: Insufficient documentation

## 2023-03-10 DIAGNOSIS — S025XXA Fracture of tooth (traumatic), initial encounter for closed fracture: Secondary | ICD-10-CM | POA: Insufficient documentation

## 2023-03-10 DIAGNOSIS — Z7901 Long term (current) use of anticoagulants: Secondary | ICD-10-CM | POA: Diagnosis not present

## 2023-03-10 DIAGNOSIS — Z7984 Long term (current) use of oral hypoglycemic drugs: Secondary | ICD-10-CM | POA: Diagnosis not present

## 2023-03-10 DIAGNOSIS — E119 Type 2 diabetes mellitus without complications: Secondary | ICD-10-CM | POA: Insufficient documentation

## 2023-03-10 DIAGNOSIS — K0889 Other specified disorders of teeth and supporting structures: Secondary | ICD-10-CM | POA: Diagnosis not present

## 2023-03-10 DIAGNOSIS — Z7951 Long term (current) use of inhaled steroids: Secondary | ICD-10-CM | POA: Diagnosis not present

## 2023-03-10 DIAGNOSIS — S00502A Unspecified superficial injury of oral cavity, initial encounter: Secondary | ICD-10-CM | POA: Diagnosis present

## 2023-03-10 NOTE — ED Triage Notes (Signed)
Pt presents to ER with c/o upper tooth pain that has been going on all day today.  Pt reports the pain goes up into his head and face.  Pt has several broken/chipped teeth in upper right and left sides of mouth.  Pt is otherwise A&O x4 and in NAD.

## 2023-03-11 DIAGNOSIS — S025XXA Fracture of tooth (traumatic), initial encounter for closed fracture: Secondary | ICD-10-CM | POA: Diagnosis not present

## 2023-03-11 MED ORDER — AMOXICILLIN 500 MG PO CAPS
500.0000 mg | ORAL_CAPSULE | Freq: Once | ORAL | Status: AC
  Administered 2023-03-11: 500 mg via ORAL
  Filled 2023-03-11: qty 1

## 2023-03-11 MED ORDER — HYDROCODONE-ACETAMINOPHEN 5-325 MG PO TABS: 1.0000 | ORAL_TABLET | Freq: Four times a day (QID) | ORAL | 0 refills | Status: DC | PRN

## 2023-03-11 MED ORDER — LIDOCAINE VISCOUS HCL 2 % MT SOLN
15.0000 mL | Freq: Once | OROMUCOSAL | Status: AC
Start: 2023-03-11 — End: 2023-03-11
  Administered 2023-03-11: 15 mL via OROMUCOSAL
  Filled 2023-03-11: qty 15

## 2023-03-11 MED ORDER — IBUPROFEN 800 MG PO TABS: 800.0000 mg | ORAL_TABLET | Freq: Three times a day (TID) | ORAL | 0 refills | Status: DC | PRN

## 2023-03-11 MED ORDER — HYDROCODONE-ACETAMINOPHEN 5-325 MG PO TABS
1.0000 | ORAL_TABLET | Freq: Once | ORAL | Status: AC
  Administered 2023-03-11: 1 via ORAL
  Filled 2023-03-11: qty 1

## 2023-03-11 MED ORDER — IBUPROFEN 800 MG PO TABS
800.0000 mg | ORAL_TABLET | Freq: Once | ORAL | Status: AC
Start: 2023-03-11 — End: 2023-03-11
  Administered 2023-03-11: 800 mg via ORAL
  Filled 2023-03-11: qty 1

## 2023-03-11 MED ORDER — AMOXICILLIN 500 MG PO CAPS: 500.0000 mg | ORAL_CAPSULE | Freq: Three times a day (TID) | ORAL | 0 refills | Status: DC

## 2023-03-11 NOTE — ED Provider Notes (Signed)
[facility] Provider Note    Event Date/Time   First MD Initiated Contact with Patient 03/11/23 0025     (approximate)   History   Dental Pain   HPI  Ricardo Wood is a 42 y.o. male who presents to the ED from home with a chief complaint of dentalgia.  Patient reports right upper tooth pain all day.  States an area "busted and drained blood" and he felt better afterwards.  Tried OTC medications without relief of symptoms.  Denies fever/chills, facial swelling, nausea/vomiting or dizziness.     Past Medical History   Past Medical History:  Diagnosis Date   Diabetes mellitus without complication (HCC)    DVT (deep venous thrombosis) (HCC)    Kidney stones    Seizures [facility])      Active Problem List   Patient Active Problem List   Diagnosis Date Noted   Lymphedema 12/11/2020   COPD (chronic obstructive pulmonary disease) (HCC) 12/11/2020   Chronic deep vein thrombosis (DVT) (HCC) 10/14/2020   Chest pain 07/25/2019     Past Surgical History   Past Surgical History:  Procedure Laterality Date   dvt filter       Home Medications   Prior to Admission medications   Medication Sig Start Date End Date Taking? Authorizing Provider  amoxicillin (AMOXIL) 500 MG capsule Take 1 capsule (500 mg total) by mouth 3 (three) times daily. 03/11/23  Yes Irean Hong, MD  HYDROcodone-acetaminophen (NORCO) 5-325 MG tablet Take 1 tablet by mouth every 6 (six) hours as needed for moderate pain. 03/11/23  Yes Irean Hong, MD  ibuprofen (ADVIL) 800 MG tablet Take 1 tablet (800 mg total) by mouth every 8 (eight) hours as needed for moderate pain. 03/11/23  Yes Irean Hong, MD  albuterol (VENTOLIN HFA) 108 (90 Base) MCG/ACT inhaler Inhale 2 puffs into the lungs every 6 (six) hours as needed for wheezing or shortness of breath. 08/26/21   Nita Sickle, MD  chlorhexidine (PERIDEX) 0.12 % solution Use as directed 15 mLs in the mouth or throat 4 (four) times  daily. 10/19/21   Nita Sickle, MD  ELIQUIS 5 MG TABS tablet  08/08/20   [provider]  famotidine (PEPCID) 20 MG tablet Take 1 tablet (20 mg total) by mouth 2 (two) times daily. 03/05/23   Irean Hong, MD  hydrocortisone 2.5 % lotion Apply topically 2 (two) times daily. 01/31/20   Triplett, Rulon Eisenmenger B, FNP  hydrOXYzine (ATARAX/VISTARIL) 25 MG tablet Take 1 tablet (25 mg total) by mouth 3 (three) times daily as needed for itching. 01/31/20   Triplett, Cari B, FNP  lidocaine (XYLOCAINE) 2 % solution Use as directed 10 mLs in the mouth or throat as needed. Patient not taking: Reported on 10/17/2020 10/06/19   Enid Derry, PA-C  magic mouthwash w/lidocaine SOLN Take 10 mLs by mouth 4 (four) times daily as needed for mouth pain. Patient not taking: Reported on 10/17/2020 01/31/20   Kem Boroughs B, FNP  metFORMIN (GLUCOPHAGE) 500 MG tablet Take 1 tablet (500 mg total) by mouth 2 (two) times daily with a meal. 08/26/21 08/26/22  Don Perking, [place], MD  oxyCODONE-acetaminophen (PERCOCET) 5-325 MG tablet Take 1 tablet by mouth every 4 (four) hours as needed for severe pain. 03/15/22   Minna Antis, MD  pantoprazole (PROTONIX) 40 MG tablet Take 1 tablet (40 mg total) by mouth daily. 03/15/22 03/15/23  Minna Antis, MD  predniSONE (DELTASONE) 20 MG tablet 3 tablets daily  x 4 days 03/05/23   Irean Hong, MD  RIVAROXABAN Carlena Hurl) VTE STARTER PACK (15 & 20 MG TABLETS) Follow package directions: Take one 15mg  tablet by mouth twice a day. On day 22, switch to one 20mg  tablet once a day. Take with food. 06/17/20   Gilles Chiquito, MD  SPIRIVA HANDIHALER 18 MCG inhalation capsule  08/08/20   [provider]  [facility] 160-4.5 MCG/ACT inhaler  08/08/20   [provider]     Allergies  Onion   Family History  History reviewed. No pertinent family history.   Physical Exam  Triage Vital Signs: ED Triage Vitals [03/10/23 2227]  Enc Vitals Group     BP (!) 156/104     Pulse  Rate 73     Resp (!) 21     Temp      Temp Source Oral     SpO2 94 %     Weight      Height      Head Circumference      Peak Flow      Pain Score      Pain Loc      Pain Edu?      Excl. in GC?     Updated Vital Signs: BP 118/80 (BP Location: Right Arm)   Pulse 90   Resp (!) 21   SpO2 96%    General: Awake, no distress.  CV:  Good peripheral perfusion.  Resp:  Normal effort.  Abd:  No distention.  Other:  Multiple dental caries noted.  Chipped and missing bilateral incisors.  No active bleeding.  No intra or extraoral swelling.   ED Results / Procedures / Treatments  Labs (all labs ordered are listed, but only abnormal results are displayed) Labs Reviewed - No data to display   EKG  None   RADIOLOGY None   Official radiology report(s): No results found.   PROCEDURES:  Critical Care performed: No  Procedures   MEDICATIONS ORDERED IN ED: Medications  amoxicillin (AMOXIL) capsule 500 mg (500 mg Oral Given 03/11/23 0053)  lidocaine (XYLOCAINE) 2 % viscous mouth solution 15 mL (15 mLs Mouth/Throat Given 03/11/23 0053)  ibuprofen (ADVIL) tablet 800 mg (800 mg Oral Given 03/11/23 0053)  HYDROcodone-acetaminophen (NORCO/VICODIN) 5-325 MG per tablet 1 tablet (1 tablet Oral Given 03/11/23 0053)     IMPRESSION / MDM / ASSESSMENT AND PLAN / ED COURSE  I reviewed the triage vital signs and the nursing notes.                             42 year old male presenting with dentalgia.  Will start amoxicillin, lidocaine rinse, NSAIDs/analgesia and refer to dental clinic for follow-up.  Strict return precautions given.  Patient and spouse verbalized understanding agree with plan of care.  Patient's presentation is most consistent with acute, uncomplicated illness.  FINAL CLINICAL IMPRESSION(S) / ED DIAGNOSES   Final diagnoses:  Pain due to dental caries  Closed fracture of tooth, initial encounter     Rx / DC Orders   ED Discharge Orders          Ordered     amoxicillin (AMOXIL) 500 MG capsule  3 times daily        03/11/23 0032    HYDROcodone-acetaminophen (NORCO) 5-325 MG tablet  Every 6 hours PRN        03/11/23 0032    ibuprofen (ADVIL) 800 MG tablet  Every 8  hours PRN        03/11/23 0032             Note:  This document was prepared using Dragon voice recognition software and may include unintentional dictation errors.   Irean Hong, MD 03/11/23 [phone]

## 2023-03-11 NOTE — Discharge Instructions (Signed)
1. Take antibiotic as prescribed (Amoxicillin 500mg three times daily x 7 days). 2. Take pain medicines as needed (Motrin/Norco #15). 3. Return to the ER for worsening symptoms, persistent vomiting, fever, difficulty breathing or other concerns.   

## 2023-04-20 ENCOUNTER — Emergency Department
Admission: EM | Admit: 2023-04-20 | Discharge: 2023-04-20 | Disposition: A | Discharge status: Home / Self Care | Payer: Medicaid Other | Source: Home / Self Care | Attending: Emergency Medicine | Admitting: Emergency Medicine

## 2023-04-20 ENCOUNTER — Other Ambulatory Visit: Payer: Self-pay

## 2023-04-20 DIAGNOSIS — K047 Periapical abscess without sinus: Secondary | ICD-10-CM | POA: Diagnosis not present

## 2023-04-20 DIAGNOSIS — K0889 Other specified disorders of teeth and supporting structures: Secondary | ICD-10-CM | POA: Diagnosis not present

## 2023-04-20 DIAGNOSIS — I10 Essential (primary) hypertension: Secondary | ICD-10-CM | POA: Insufficient documentation

## 2023-04-20 NOTE — Discharge Instructions (Signed)
Your blood pressure is driven by pain.  After your evaluation in the emergency department, we think it is okay to proceed with your dental procedure.  Take those antibiotics that your dentist prescribed for you.  Take acetaminophen 650 mg and ibuprofen 400 mg every 6 hours for pain.  Take with food.  Thank you for choosing [place] for your health care today!  Please see your primary doctor this week for a follow up appointment.   If you have any new, worsening, or unexpected symptoms call your doctor right away or come back to the emergency department for reevaluation.  It was my pleasure to care for you today.   Daneil Dan Modesto Charon, MD

## 2023-04-20 NOTE — ED Triage Notes (Addendum)
Pt arrives via POV from dentist for hypertension 152/112. He went to the dental office to have a tooth on the R side pulled, swelling noted to R jaw. Pt reports 9/10 pain in jaw, also reporting light headedness. Pt reports new rx for abx that he has not started.

## 2023-04-20 NOTE — ED Provider Notes (Signed)
[facility] Provider Note    Event Date/Time   First MD Initiated Contact with Patient 04/20/23 1150     (approximate)   History   Hypertension and Dental Pain   HPI  Ricardo Wood is a 42 y.o. male   Past medical history of, prior provoked DVT no longer on anticoagulation, who presents to the emergency department with Right lower molar dental pain w dental caries at the dentist today for removing a tooth and was found to be systolic 150s and sent to the emerged apartment for high blood pressure.    Tooth Pain for last 3 days.  Started on antibiotics, has it but has not taken it this morning yet.    Denies chest pain headaches or any other acute medical complaints except for his tooth pain.  No fever.       Physical Exam   Triage Vital Signs: ED Triage Vitals [04/20/23 1131]  Encounter Vitals Group     BP (!) 132/95     Systolic BP Percentile      Diastolic BP Percentile      Pulse Rate 87     Resp 20     Temp 98.9 F (37.2 C)     Temp Source Oral     SpO2 97 %     Weight 300 lb (136.1 kg)     Height 6' (1.829 m)     Head Circumference      Peak Flow      Pain Score 9     Pain Loc      Pain Education      Exclude from Growth Chart     Most recent vital signs: Vitals:   04/20/23 1131  BP: (!) 132/95  Pulse: 87  Resp: 20  Temp: 98.9 F (37.2 C)  SpO2: 97%    General: Awake, no distress.  CV:  Good peripheral perfusion.  Resp:  Normal effort.  Abd:  No distention.  Other:  Dental caries worse on the right lower molar side.  No significant facial swelling, no obvious periapical abscess or intraoral abscess to drain.  Nontoxic comfortable appearing no fever.   ED Results / Procedures / Treatments   Labs (all labs ordered are listed, but only abnormal results are displayed) Labs Reviewed - No data to display  PROCEDURES:  Critical Care performed: No  Procedures   MEDICATIONS ORDERED IN ED: Medications - No data  to display  IMPRESSION / MDM / ASSESSMENT AND PLAN / ED COURSE  I reviewed the triage vital signs and the nursing notes.                                Patient's presentation is most consistent with acute presentation with potential threat to life or bodily function.  Differential diagnosis includes, but is not limited to, is, dental infection, dental abscess, facial abscess, sepsis, hypertension in the setting of pain   The patient is on the cardiac monitor to evaluate for evidence of arrhythmia and/or significant heart rate changes.  MDM: Hypertension 150s rechecked 130s, in the setting of pain.  No other hypertensive emergency symptoms like blurry vision, headache, chest pain doubt endorgan damage.  He has dental caries and likely dental infection with no drainable abscess noted.  He will start on his antibiotics as prescribed and follow-up with his dentist.        FINAL CLINICAL IMPRESSION(S) /  ED DIAGNOSES   Final diagnoses:  Hypertension, unspecified type  Pain, dental  Dental infection     Rx / DC Orders   ED Discharge Orders          Ordered    Ambulatory Referral to Primary Care (Establish Care)        04/20/23 1213             Note:  This document was prepared using Dragon voice recognition software and may include unintentional dictation errors.    Pilar Jarvis, MD 04/20/23 [phone]

## 2023-05-11 ENCOUNTER — Other Ambulatory Visit: Payer: Self-pay

## 2023-05-11 ENCOUNTER — Emergency Department
Admission: EM | Admit: 2023-05-11 | Discharge: 2023-05-11 | Disposition: A | Discharge status: Home / Self Care | Payer: 59 | Attending: Emergency Medicine | Admitting: Emergency Medicine

## 2023-05-11 ENCOUNTER — Encounter: Payer: Self-pay | Admitting: Emergency Medicine

## 2023-05-11 DIAGNOSIS — T7840XA Allergy, unspecified, initial encounter: Secondary | ICD-10-CM | POA: Insufficient documentation

## 2023-05-11 DIAGNOSIS — R21 Rash and other nonspecific skin eruption: Secondary | ICD-10-CM | POA: Diagnosis present

## 2023-05-11 MED ORDER — EPINEPHRINE 0.3 MG/0.3ML IJ SOAJ
0.3000 mg | Freq: Once | INTRAMUSCULAR | Status: AC
  Administered 2023-05-11: 0.3 mg via INTRAMUSCULAR
  Filled 2023-05-11: qty 0.3

## 2023-05-11 MED ORDER — PREDNISONE 50 MG PO TABS: 50.0000 mg | ORAL_TABLET | Freq: Every day | ORAL | 0 refills | Status: AC

## 2023-05-11 MED ORDER — FAMOTIDINE IN NACL 20-0.9 MG/50ML-% IV SOLN
20.0000 mg | Freq: Once | INTRAVENOUS | Status: AC
  Administered 2023-05-11: 20 mg via INTRAVENOUS
  Filled 2023-05-11: qty 50

## 2023-05-11 MED ORDER — DIPHENHYDRAMINE HCL 50 MG/ML IJ SOLN
25.0000 mg | Freq: Once | INTRAMUSCULAR | Status: AC
  Administered 2023-05-11: 25 mg via INTRAVENOUS
  Filled 2023-05-11: qty 1

## 2023-05-11 MED ORDER — METHYLPREDNISOLONE SODIUM SUCC 125 MG IJ SOLR
125.0000 mg | Freq: Once | INTRAMUSCULAR | Status: AC
  Administered 2023-05-11: 125 mg via INTRAVENOUS
  Filled 2023-05-11: qty 2

## 2023-05-11 NOTE — ED Provider Notes (Signed)
[facility] Provider Note    Event Date/Time   First MD Initiated Contact with Patient 05/11/23 [phone]     (approximate)   History   Allergic Reaction   HPI  Ricardo Wood is a 42 y.o. male with a history of DT and seizures, not currently on any medications, who presents with upper lip swelling, acute onset last night, persistent since then, and associated with some itching around the lip but no other hives or rash to the rest of his body.  He denies any tongue swelling or any tightness in the back of his throat.  He has no difficulty swallowing.  He states his nose feels stuffy.  He reports some mild cough but no wheezing.  He denies any fever or chills.  He states this is similar to an episode he had a couple of months ago and he was recommended for allergy testing but was not able to follow-up because he did not have insurance.  He now has insurance established.  The patient also reports that he lost a left upper front tooth a few days ago and is concerned for an infection there.  I reviewed the past medical records.  The patient was seen in the ED on 6/6 with a similar presentation except with swelling in the lower lip.  He was treated successfully with epinephrine, steroid, antihistamines.  His most recent outpatient encounter was with hematology in August of last year for follow-up of DVT.   Physical Exam   Triage Vital Signs: ED Triage Vitals  Encounter Vitals Group     BP 05/11/23 0941 (!) 131/96     Systolic BP Percentile --      Diastolic BP Percentile --      Pulse Rate 05/11/23 0941 100     Resp 05/11/23 0941 20     Temp 05/11/23 0941 98.9 F (37.2 C)     Temp Source 05/11/23 0941 Oral     SpO2 05/11/23 0941 99 %     Weight 05/11/23 0942 300 lb (136.1 kg)     Height 05/11/23 0942 6' (1.829 m)     Head Circumference --      Peak Flow --      Pain Score --      Pain Loc --      Pain Education --      Exclude from Growth Chart --      Most recent vital signs: Vitals:   05/11/23 0941 05/11/23 0945  BP: (!) 131/96 121/82  Pulse: 100 72  Resp: 20   Temp: 98.9 F (37.2 C)   SpO2: 99% 99%    General: Alert, well-appearing, no distress.  CV:  Good peripheral perfusion.  Resp:  Normal effort.  Abd:  No distention.  Other:  Moderate bilateral upper lip swelling.  Tongue appears normal.  No posterior oropharyngeal swelling.  Clear voice.  Decayed teeth with no significant erythema, induration, or swelling to the gingiva or buccal mucosa.  No hives or rash to the rest of the body.   ED Results / Procedures / Treatments   Labs (all labs ordered are listed, but only abnormal results are displayed) Labs Reviewed - No data to display   EKG    RADIOLOGY    PROCEDURES:  Critical Care performed: No  Procedures   MEDICATIONS ORDERED IN ED: Medications  EPINEPHrine (EPI-PEN) injection 0.3 mg (0.3 mg Intramuscular Given 05/11/23 1028)  methylPREDNISolone sodium succinate (SOLU-MEDROL) 125 mg/2 mL injection  125 mg (125 mg Intravenous Given 05/11/23 1034)  diphenhydrAMINE (BENADRYL) injection 25 mg (25 mg Intravenous Given 05/11/23 1032)  famotidine (PEPCID) IVPB 20 mg premix (0 mg Intravenous Stopped 05/11/23 1131)     IMPRESSION / MDM / ASSESSMENT AND PLAN / ED COURSE  I reviewed the triage vital signs and the nursing notes.  42 year old male with PMH as noted above presents with upper lip swelling and itching similar to prior allergic reactions.  The patient is aware of an allergy to onions but it does not know of any other food or medication allergies and does not believe he consumed any onions.  He is not on any ACE inhibitors or other medications currently.  Differential diagnosis includes, but is not limited to, allergic reaction, angioedema.  The symptoms have been relatively stable over several hours.  There is no evidence of airway involvement.  I will treat with epinephrine, steroid, antihistamines,  and reassess.  Patient's presentation is most consistent with acute presentation with potential threat to life or bodily function.  ----------------------------------------- 12:14 PM on 05/11/2023 -----------------------------------------  The patient is feeling significantly better.  The swelling has improved.  He would like to go home.  I think that given the lack of airway involvement and the significant improvement this is reasonable.  I counseled the patient on the possibility of rebound symptoms after the epinephrine wears off.  He agrees to follow-up with a primary care provider.  I gave strict return precautions and he expressed understanding.   FINAL CLINICAL IMPRESSION(S) / ED DIAGNOSES   Final diagnoses:  Allergic reaction, initial encounter     Rx / DC Orders   ED Discharge Orders          Ordered    predniSONE (DELTASONE) 50 MG tablet  Daily        05/11/23 1128             Note:  This document was prepared using Dragon voice recognition software and may include unintentional dictation errors.    [name], MD 05/11/23 [phone]

## 2023-05-11 NOTE — Discharge Instructions (Addendum)
Return to the ER immediately for new, worsening, or recurrent lip swelling, tightness in your throat, difficulty breathing or swallowing, severe rash or hives, or any other new or worsening symptoms that concern you.  Follow-up with your primary care provider.  Take the prednisone as prescribed.

## 2023-05-11 NOTE — ED Triage Notes (Signed)
Patient arrives ambulatory by POV with allergic reaction onset of last night. Patient has upper lip edema. Able to maintain saliva. No tongue swelling. Clear voice. Reports having fever over the weekend. Denies any sick contacts.

## 2023-05-11 NOTE — ED Notes (Signed)
Pt states he has a slight lip tremor since his allergic reaction occurred but also states that his lip swelling has improved since getting his medications. Pt appreciative.

## 2023-05-26 DIAGNOSIS — Z9119 Patient's noncompliance with other medical treatment and regimen due to financial hardship: Secondary | ICD-10-CM | POA: Diagnosis not present

## 2023-05-26 DIAGNOSIS — Z86718 Personal history of other venous thrombosis and embolism: Secondary | ICD-10-CM | POA: Diagnosis not present

## 2023-05-26 DIAGNOSIS — Z87891 Personal history of nicotine dependence: Secondary | ICD-10-CM | POA: Diagnosis not present

## 2023-05-26 DIAGNOSIS — E11630 Type 2 diabetes mellitus with periodontal disease: Secondary | ICD-10-CM | POA: Diagnosis not present

## 2023-05-26 DIAGNOSIS — Z8249 Family history of ischemic heart disease and other diseases of the circulatory system: Secondary | ICD-10-CM | POA: Diagnosis not present

## 2023-05-26 DIAGNOSIS — Z6841 Body Mass Index (BMI) 40.0 and over, adult: Secondary | ICD-10-CM | POA: Diagnosis not present

## 2023-05-26 DIAGNOSIS — Z5986 Financial insecurity: Secondary | ICD-10-CM | POA: Diagnosis not present

## 2023-05-26 DIAGNOSIS — L400 Psoriasis vulgaris: Secondary | ICD-10-CM | POA: Diagnosis not present

## 2023-05-26 DIAGNOSIS — I10 Essential (primary) hypertension: Secondary | ICD-10-CM | POA: Diagnosis not present

## 2023-05-26 DIAGNOSIS — Z809 Family history of malignant neoplasm, unspecified: Secondary | ICD-10-CM | POA: Diagnosis not present

## 2023-06-03 IMAGING — CR DG CHEST 2V
1 series · 2 of 2 positions shown · non-contrast
Comparison: 07/25/2019

CLINICAL DATA: Cough and chest pain

EXAM:
CHEST - 2 VIEW

[Series 1: dg chest 2 view · 0.14mm/px · 2 of 2 slices shown]
[im 1/2]
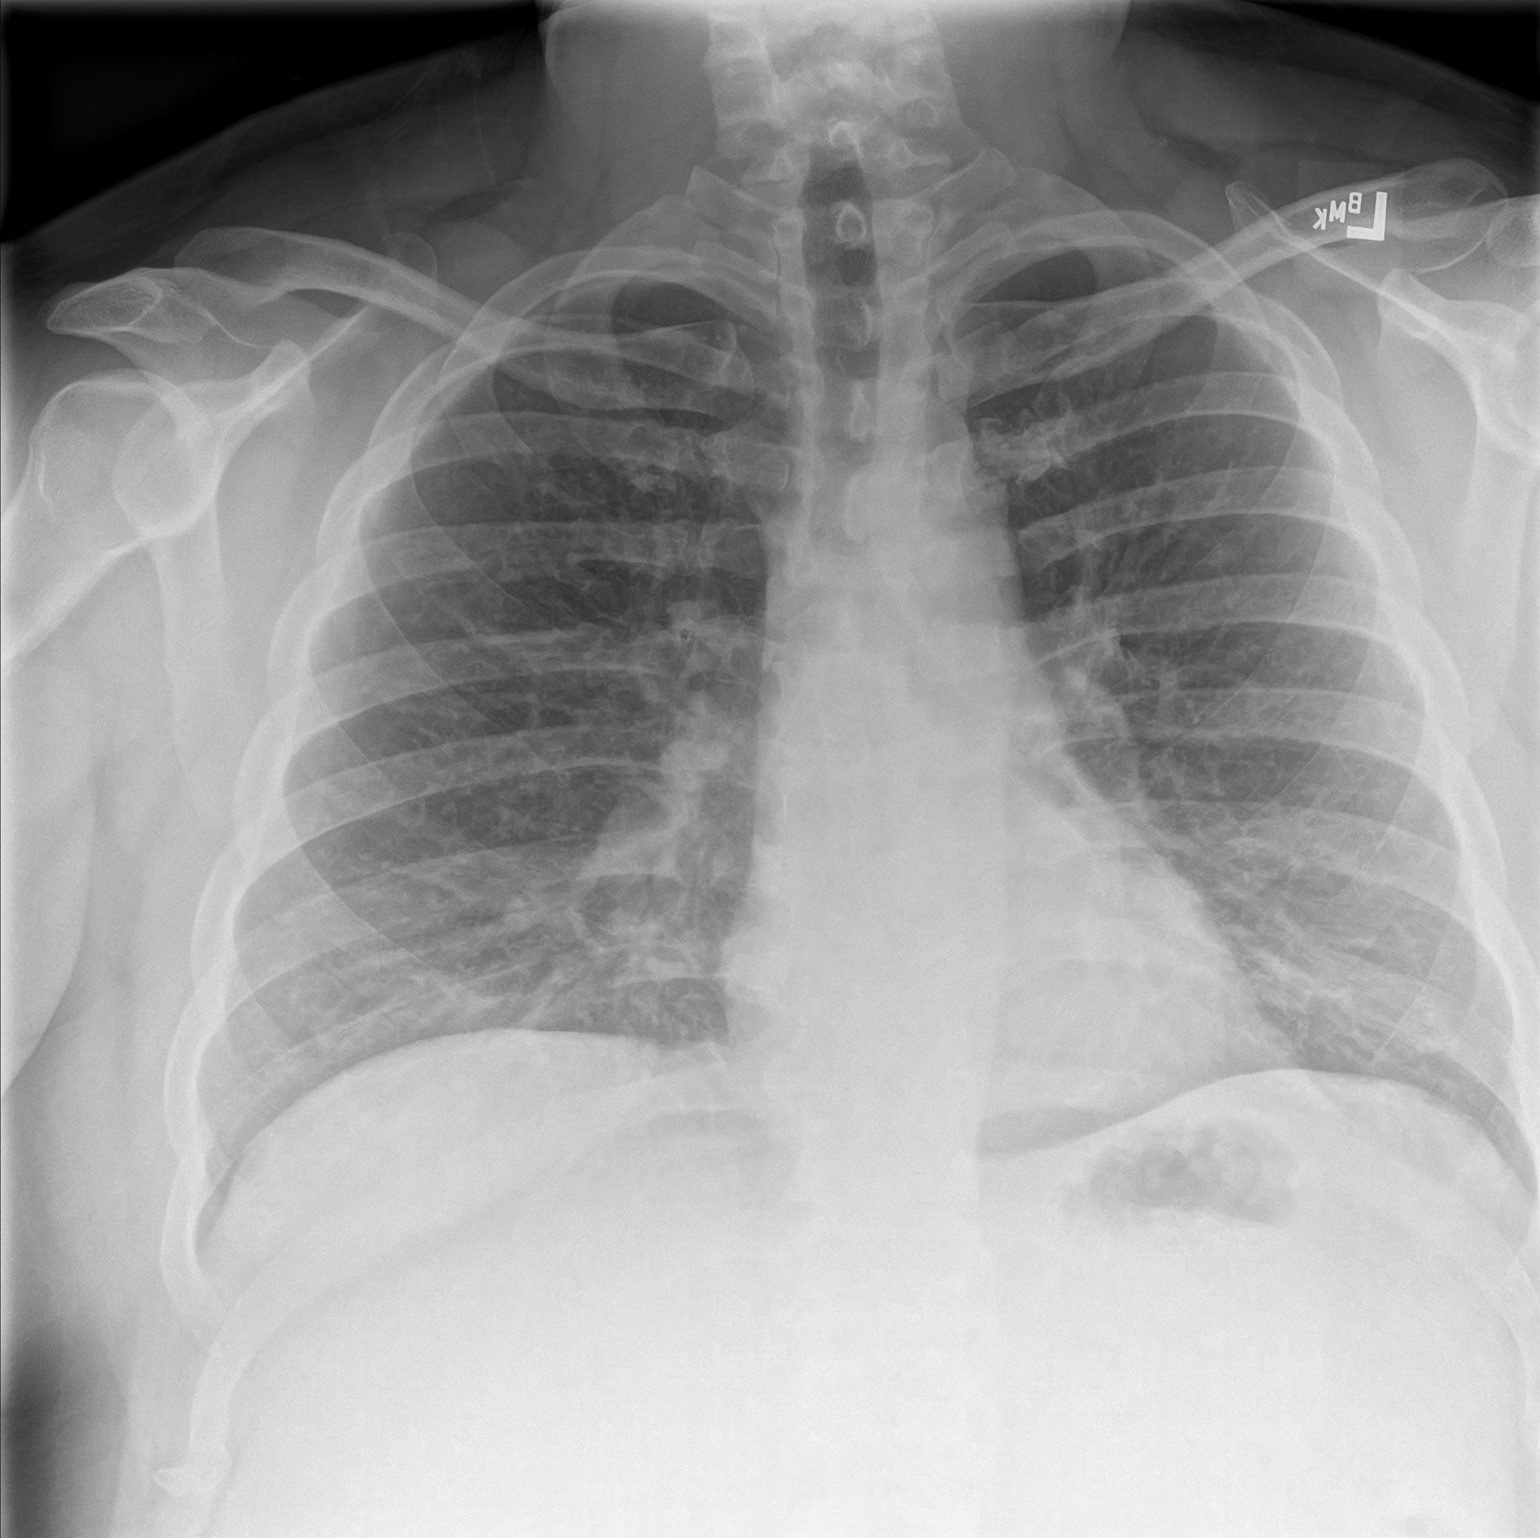
[im 2/2]
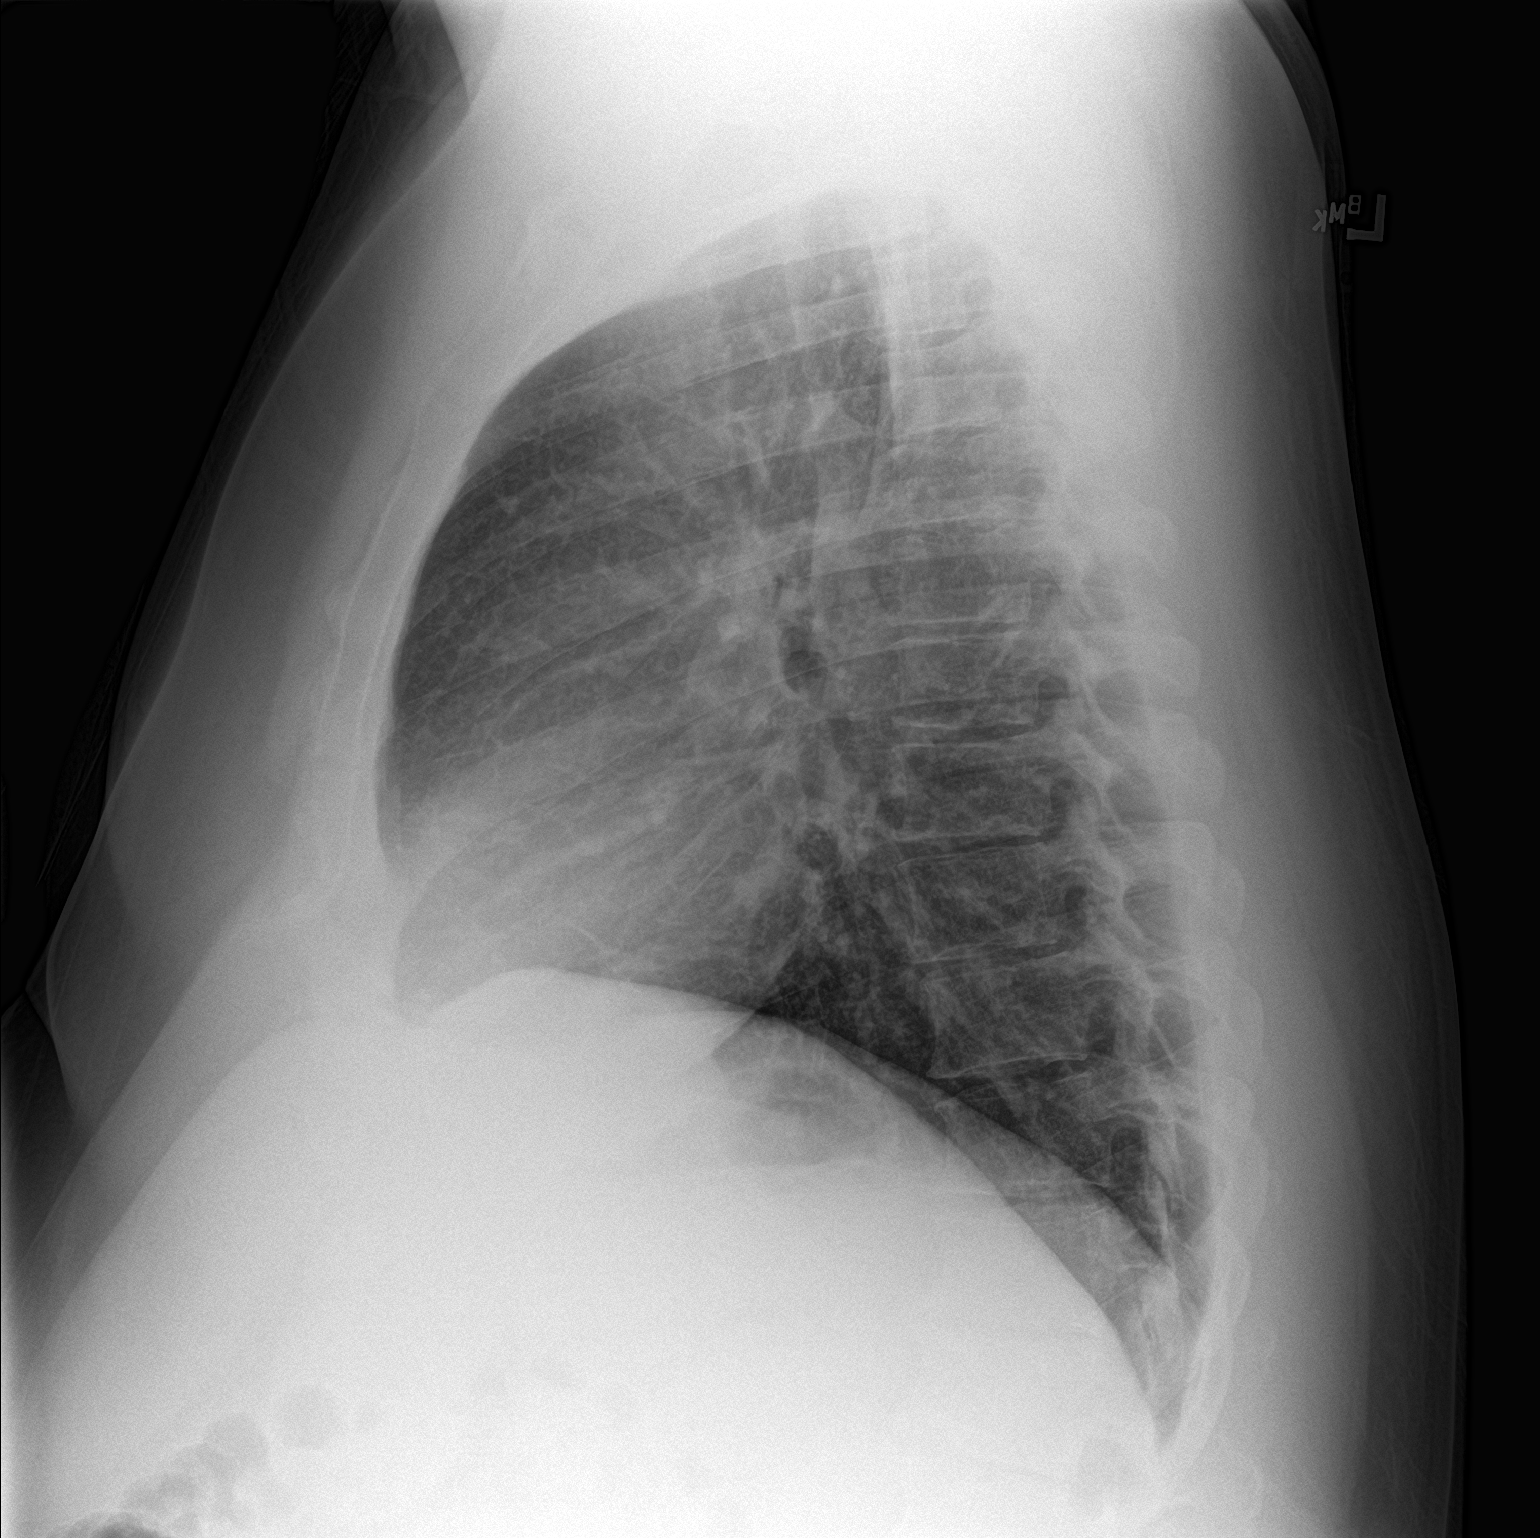

[2 of 2 positions shown; findings below may reference images not displayed]

FINDINGS: The heart size and mediastinal contours are within normal limits.
Both lungs are clear. The visualized skeletal structures are
unremarkable.
IMPRESSION: No active cardiopulmonary disease.

## 2023-06-29 ENCOUNTER — Other Ambulatory Visit: Payer: Self-pay

## 2023-06-29 ENCOUNTER — Emergency Department
Admission: EM | Admit: 2023-06-29 | Discharge: 2023-06-29 | Disposition: A | Discharge status: Home / Self Care | Payer: 59 | Attending: Emergency Medicine | Admitting: Emergency Medicine

## 2023-06-29 DIAGNOSIS — K0889 Other specified disorders of teeth and supporting structures: Secondary | ICD-10-CM | POA: Diagnosis not present

## 2023-06-29 DIAGNOSIS — K029 Dental caries, unspecified: Secondary | ICD-10-CM | POA: Insufficient documentation

## 2023-06-29 MED ORDER — OXYCODONE-ACETAMINOPHEN 5-325 MG PO TABS: 1.0000 | ORAL_TABLET | Freq: Four times a day (QID) | ORAL | 0 refills | Status: DC | PRN

## 2023-06-29 MED ORDER — AMOXICILLIN-POT CLAVULANATE 875-125 MG PO TABS
1.0000 | ORAL_TABLET | Freq: Once | ORAL | Status: AC
  Administered 2023-06-29: 1 via ORAL
  Filled 2023-06-29: qty 1

## 2023-06-29 MED ORDER — OXYCODONE-ACETAMINOPHEN 5-325 MG PO TABS
2.0000 | ORAL_TABLET | Freq: Once | ORAL | Status: AC
  Administered 2023-06-29: 2 via ORAL
  Filled 2023-06-29: qty 2

## 2023-06-29 MED ORDER — AMOXICILLIN-POT CLAVULANATE 875-125 MG PO TABS: 1.0000 | ORAL_TABLET | Freq: Two times a day (BID) | ORAL | 0 refills | Status: AC

## 2023-06-29 MED ORDER — IBUPROFEN 600 MG PO TABS: 600.0000 mg | ORAL_TABLET | Freq: Three times a day (TID) | ORAL | 0 refills | Status: DC | PRN

## 2023-06-29 NOTE — ED Provider Notes (Signed)
[facility] Provider Note    Event Date/Time   First MD Initiated Contact with Patient 06/29/23 2145     (approximate)   History   Dental Pain   HPI  Ricardo Wood is a 42 y.o. male  here with dental pain. Pt reports that over the past several days he has had worsening upper dental pain. He has recurrent dental infections 2/2 two fractured lateral incisors. He reports that over the last week he has had increasing pain, swelling and pain now radiating into his sinuses. No eye pain. No pain with EOM. No facial swelling. No SOB. No tongue swelling.   Physical Exam   Triage Vital Signs: ED Triage Vitals  Encounter Vitals Group     BP 06/29/23 2027 (!) 145/91     Systolic BP Percentile --      Diastolic BP Percentile --      Pulse Rate 06/29/23 2027 78     Resp 06/29/23 2027 17     Temp 06/29/23 2027 97.9 F (36.6 C)     Temp Source 06/29/23 2027 Oral     SpO2 06/29/23 2027 96 %     Weight 06/29/23 2028 295 lb (133.8 kg)     Height 06/29/23 2028 6' (1.829 m)     Head Circumference --      Peak Flow --      Pain Score 06/29/23 2032 7     Pain Loc --      Pain Education --      Exclude from Growth Chart --     Most recent vital signs: Vitals:   06/29/23 2027 06/29/23 2223  BP: (!) 145/91 (!) 140/89  Pulse: 78 75  Resp: 17 18  Temp: 97.9 F (36.6 C)   SpO2: 96% 98%     General: Awake, no distress.  CV:  Good peripheral perfusion.  Resp:  Normal work of breathing.  Abd:  No distention.  Other:  Markedly poor dentition with fractured lateral incisors with active caries bilaterally. Moderate diffuse gingival edema across upper central and lateral incisors. No facial swelling. No appreciable gingival abscess. TTP over R maxillary sinus but no swelling. EOMI and painless. OP clear. No sublingual swelling.   ED Results / Procedures / Treatments   Labs (all labs ordered are listed, but only abnormal results are displayed) Labs Reviewed -  No data to display   EKG    RADIOLOGY    I also independently reviewed and agree with radiologist interpretations.   PROCEDURES:  Critical Care performed: No   MEDICATIONS ORDERED IN ED: Medications  amoxicillin-clavulanate (AUGMENTIN) 875-125 MG per tablet 1 tablet (1 tablet Oral Given 06/29/23 2209)  oxyCODONE-acetaminophen (PERCOCET/ROXICET) 5-325 MG per tablet 2 tablet (2 tablets Oral Given 06/29/23 2209)     IMPRESSION / MDM / ASSESSMENT AND PLAN / ED COURSE  I reviewed the triage vital signs and the nursing notes.                              Differential diagnosis includes, but is not limited to, dental infection, periapical abscess, pulpitis, sinusitis  Patient's presentation is most consistent with acute, uncomplicated illness.  The patient is on the cardiac monitor to evaluate for evidence of arrhythmia and/or significant heart rate changes  42 yo M here with severe dental pain, swelling. Exam is c/w acute dental infection and likely periapical abscess/infection. No signs of facial extension.  No airway compromise. No signs of sepsis. Will start on empiric abx, and give brief course of analgesia. Return precautions given. Dental f/u provided.  FINAL CLINICAL IMPRESSION(S) / ED DIAGNOSES   Final diagnoses:  Dental caries  Pain, dental     Rx / DC Orders   ED Discharge Orders          Ordered    amoxicillin-clavulanate (AUGMENTIN) 875-125 MG tablet  2 times daily        06/29/23 2219    ibuprofen (ADVIL) 600 MG tablet  Every 8 hours PRN        06/29/23 2219    oxyCODONE-acetaminophen (PERCOCET) 5-325 MG tablet  Every 6 hours PRN        06/29/23 2219             Note:  This document was prepared using Dragon voice recognition software and may include unintentional dictation errors.   Shaune Pollack, MD 06/29/23 2237

## 2023-06-29 NOTE — ED Notes (Signed)
Pt verbalizes understanding of discharge instructions. Opportunity for questioning and answers were provided. Pt discharged from ED to home with wife.    

## 2023-06-29 NOTE — ED Triage Notes (Signed)
Pt states he has multiple upper broken teeth that he has known infection and abscess to. Pt states pain has gotten worse tonight and has no relief with tylenol or ibuprofen

## 2023-06-29 NOTE — Discharge Instructions (Signed)
OPTIONS FOR DENTAL FOLLOW UP CARE ° °[place] Department of Health and Human Services - Local Safety Net Dental Clinics °http://www.ncdhhs.gov/dph/oralhealth/services/safetynetclinics.htm °  °[facility] ([phone]) ° °[facility] [place] ([phone]) ° °[facility] ([phone] ext [phone]) ° °[facility] ([phone]) ° °[facility] ([phone]) °This clinic caters to the indigent population and is on a lottery system. °Location: °[facility], [address], [place] °Clinic Hours: °Wednesdays from 6pm - 9pm, patients seen by a lottery system. °For dates, call or go to www.med.[facility].edu/[facility]/patients/Dental-[facility] °Services: °Cleanings, fillings and simple extractions. °Payment Options: °DENTAL WORK IS FREE OF CHARGE. Bring proof of income or support. °Best way to get seen: °Arrive at 5:15 pm - this is a lottery, NOT first come/first serve, so arriving earlier will not increase your chances of being seen. °  °  °[facility] °[phone] °Select option 1 for emergencies °  °Location: °[facility], [address], [place] °Clinic Hours: °No walk-ins accepted - call the day before to schedule an appointment. °Check in times are 9:30 am and 1:30 pm. °Services: °Simple extractions, temporary fillings, pulpectomy/pulp debridement, uncomplicated abscess drainage. °Payment Options: °PAYMENT IS DUE AT THE TIME OF SERVICE.  Fee is usually $100-200, additional surgical procedures (e.g. abscess drainage) may be extra. °Cash, checks, Visa/MasterCard accepted.  Can file Medicaid if patient is covered for dental - patient should call case worker to check. °No discount for [facility] patients. °Best way to get seen: °MUST call the day before and get onto the schedule. Can usually be seen the next 1-2 days. No walk-ins accepted. °  °  °[facility] °[phone] °   °Location: °[facility], [address], [place] °Clinic Hours: °M, W, Th, F 8am or 1:30pm, Tues 9a or 1:30 - first come/first served. °Services: °Simple extractions, temporary fillings, uncomplicated abscess drainage.  You do not need to be an [place] resident. °Payment Options: °PAYMENT IS DUE AT THE TIME OF SERVICE. °Dental insurance, otherwise sliding scale - bring proof of income or support. °Depending on income and treatment needed, cost is usually $50-200. °Best way to get seen: °Arrive early as it is first come/first served. °  °  °[facility] °[phone] °  °Location: °[address] °Clinic Hours: °Mon-Thu 8a-5p °Services: °Most basic dental services including extractions and fillings. °Payment Options: °PAYMENT IS DUE AT THE TIME OF SERVICE. °Sliding scale, up to 50% off - bring proof if income or support. °Medicaid with dental option accepted. °Best way to get seen: °Call to schedule an appointment, can usually be seen within 2 weeks OR they will try to see walk-ins - show up at 8a or 2p (you may have to wait). °  °  °[facility] °[phone] °[place] RESIDENTS ONLY °  °Location: °[facility], [address], [place], [place] [address] °Clinic Hours: By appointment only. °Monday - Thursday 8am-5pm, Friday 8am-12pm °Services: Cleanings, fillings, extractions. °Payment Options: °PAYMENT IS DUE AT THE TIME OF SERVICE. °Cash, Visa or MasterCard. Sliding scale - $30 minimum per service. °Best way to get seen: °Come in to office, complete packet and make an appointment - need proof of income °or support monies for each household member and proof of [place] residence. °Usually takes about a month to get in. °  °  °[facility] °[phone] °  °Location: °[address]   [place] °Clinic Hours: Walk-in Urgent Care Dental Services are offered Monday-Friday  mornings only. °The numbers of emergencies accepted daily is limited to the number of °providers available. °Maximum 15 - Mondays, Wednesdays & Thursdays °Maximum 10 - Tuesdays & Fridays °Services: °You do not need to be a [place] resident to be seen for a dental emergency. °Emergencies are defined as pain, swelling, abnormal bleeding, or dental trauma. Walkins will receive x-rays if needed. °NOTE: Dental cleaning is not an emergency. °Payment Options: °PAYMENT IS DUE AT THE TIME OF SERVICE. °Minimum co-pay is $40.00 for uninsured patients. °Minimum co-pay is $3.00 for Medicaid with dental coverage. °Dental Insurance is accepted and must be presented at time of visit. °Medicare does not cover dental. °Forms of payment: Cash, credit card, checks. °Best way to get seen: °If not previously registered with the clinic, walk-in dental registration begins at 7:15 am and is on a first come/first serve basis. °If previously registered with the clinic, call to make an appointment. °  °  °[facility] °[phone] °[place] RESIDENTS ONLY °  °Location: °[address], [place], [place] °Clinic Hours: °Mon-Thu 10a-2p °Services: Extractions only! °Payment Options: °FREE (donations accepted) - bring proof of income or support °Best way to get seen: °Call and schedule an appointment OR come at 8am on the 1st Monday of every month (except for holidays) when it is first come/first served. °  °  °[facility] °[phone] °  °Location: °[address], [place] °Clinic Hours: °Friday mornings °Services, Payment Options, Best way to get seen: °Call for info °

## 2023-09-03 ENCOUNTER — Emergency Department
Admission: EM | Admit: 2023-09-03 | Discharge: 2023-09-04 | Disposition: A | Discharge status: Home / Self Care | Payer: 59 | Attending: Emergency Medicine | Admitting: Emergency Medicine

## 2023-09-03 ENCOUNTER — Other Ambulatory Visit: Payer: Self-pay

## 2023-09-03 DIAGNOSIS — E1165 Type 2 diabetes mellitus with hyperglycemia: Secondary | ICD-10-CM | POA: Diagnosis not present

## 2023-09-03 DIAGNOSIS — Z7984 Long term (current) use of oral hypoglycemic drugs: Secondary | ICD-10-CM | POA: Insufficient documentation

## 2023-09-03 DIAGNOSIS — Z7901 Long term (current) use of anticoagulants: Secondary | ICD-10-CM | POA: Diagnosis not present

## 2023-09-03 DIAGNOSIS — R42 Dizziness and giddiness: Secondary | ICD-10-CM | POA: Diagnosis not present

## 2023-09-03 DIAGNOSIS — R739 Hyperglycemia, unspecified: Secondary | ICD-10-CM

## 2023-09-03 LAB — CBC
HCT: 42.8 % (ref 39.0–52.0)
Hemoglobin: 14.9 g/dL (ref 13.0–17.0)
MCH: 30.7 pg (ref 26.0–34.0)
MCHC: 34.8 g/dL (ref 30.0–36.0)
MCV: 88.1 fL (ref 80.0–100.0)
Platelets: 166 10*3/uL (ref 150–400)
RBC: 4.86 MIL/uL (ref 4.22–5.81)
RDW: 12.3 % (ref 11.5–15.5)
WBC: 8.5 10*3/uL (ref 4.0–10.5)
nRBC: 0 % (ref 0.0–0.2)

## 2023-09-03 LAB — URINALYSIS, ROUTINE W REFLEX MICROSCOPIC
Bacteria, UA: NONE SEEN
Bilirubin Urine: NEGATIVE
Glucose, UA: >=500 mg/dL — AB
Hgb urine dipstick: NEGATIVE
Ketones, ur: NEGATIVE mg/dL
Leukocytes,Ua: NEGATIVE
Nitrite: NEGATIVE
Protein, ur: NEGATIVE mg/dL
RBC / HPF: 0 RBC/hpf (ref 0–5)
Specific Gravity, Urine: 1.029 (ref 1.005–1.030)
Squamous Epithelial / HPF: 0 /[HPF] (ref 0–5)
pH: 5 (ref 5.0–8.0)

## 2023-09-03 LAB — CBG MONITORING, ED: Glucose-Capillary: 477 mg/dL — ABNORMAL HIGH (ref 70–99)

## 2023-09-03 LAB — BASIC METABOLIC PANEL
Anion gap: 7 (ref 5–15)
BUN: 11 mg/dL (ref 6–20)
CO2: 27 mmol/L (ref 22–32)
Calcium: 9.2 mg/dL (ref 8.9–10.3)
Chloride: 98 mmol/L (ref 98–111)
Creatinine, Ser: 0.89 mg/dL (ref 0.61–1.24)
GFR, Estimated: >60 mL/min (ref >60)
Glucose, Bld: 508 mg/dL (ref 70–99)
Potassium: 3.8 mmol/L (ref 3.5–5.1)
Sodium: 132 mmol/L — ABNORMAL LOW (ref 135–145)

## 2023-09-03 LAB — TROPONIN I (HIGH SENSITIVITY): Troponin I (High Sensitivity): 5 ng/L (ref <18)

## 2023-09-03 NOTE — ED Triage Notes (Signed)
Pt c/o dizziness and headache x2 days, pt states he was told he had an A1C of 12.2 and was prescribed metformin but haven't filled the Rx yet. Pt is AOX4. Pt also reports numbness of right 3 toes on right foot. No discoloration noted, cap refill <3 seconds. Pt denies vision changes.

## 2023-09-04 DIAGNOSIS — E1165 Type 2 diabetes mellitus with hyperglycemia: Secondary | ICD-10-CM | POA: Diagnosis not present

## 2023-09-04 LAB — CBG MONITORING, ED
Glucose-Capillary: 297 mg/dL — ABNORMAL HIGH (ref 70–99)
Glucose-Capillary: 336 mg/dL — ABNORMAL HIGH (ref 70–99)

## 2023-09-04 MED ORDER — METFORMIN HCL 500 MG PO TABS: 1000.0000 mg | ORAL_TABLET | Freq: Two times a day (BID) | ORAL | 2 refills | Status: DC

## 2023-09-04 MED ORDER — SODIUM CHLORIDE 0.9 % IV BOLUS
1000.0000 mL | Freq: Once | INTRAVENOUS | Status: AC
  Administered 2023-09-04: 1000 mL via INTRAVENOUS

## 2023-09-04 NOTE — Discharge Instructions (Signed)
Take Metformin 1000 mg twice daily as prescribed.  Return to the ER for worsening symptoms, persistent vomiting, difficulty breathing or other concerns.

## 2023-09-04 NOTE — ED Provider Notes (Signed)
[facility] Provider Note    Event Date/Time   First MD Initiated Contact with Patient 09/04/23 0002     (approximate)   History   Dizziness   HPI  Ricardo Wood is a 42 y.o. male who presents to the ED from home with a chief complaint of dizziness x 2 days.  Patient was diagnosed with diabetes several months ago and prescribed metformin.  He has not yet filled his prescription and is not taking any of his prescribed medications including Eliquis due to finances.  He states he is close to paying all his bills and will be able to afford his medications soon.  Endorses dizziness and numb feet.  Denies fever/chills, chest pain, shortness of breath, abdominal pain, nausea or vomiting.     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 Authorizing Provider  metFORMIN (GLUCOPHAGE) 500 MG tablet Take 2 tablets (1,000 mg total) by mouth 2 (two) times daily with a meal. 09/04/23  Yes Irean Hong, MD  albuterol (VENTOLIN HFA) 108 (90 Base) MCG/ACT inhaler Inhale 2 puffs into the lungs every 6 (six) hours as needed for wheezing or shortness of breath. 08/26/21   Nita Sickle, MD  amoxicillin (AMOXIL) 500 MG capsule Take 1 capsule (500 mg total) by mouth 3 (three) times daily. 03/11/23   Irean Hong, MD  chlorhexidine (PERIDEX) 0.12 % solution Use as directed 15 mLs in the mouth  or throat 4 (four) times daily. 10/19/21   Nita Sickle, MD  ELIQUIS 5 MG TABS tablet  08/08/20   [provider]  famotidine (PEPCID) 20 MG tablet Take 1 tablet (20 mg total) by mouth 2 (two) times daily. 03/05/23   Irean Hong, MD  HYDROcodone-acetaminophen (NORCO) 5-325 MG tablet Take 1 tablet by mouth every 6 (six) hours as needed for moderate pain. 03/11/23   Irean Hong, MD  hydrocortisone 2.5 % lotion Apply topically 2 (two) times daily. 01/31/20   Triplett, Rulon Eisenmenger B, FNP  hydrOXYzine (ATARAX/VISTARIL) 25 MG tablet Take 1 tablet (25 mg total) by mouth 3 (three) times daily as needed for itching. 01/31/20   Triplett, Cari B, FNP  ibuprofen (ADVIL) 600 MG tablet Take 1 tablet (600 mg total) by mouth every 8 (eight) hours as needed for moderate pain. 06/29/23   Shaune Pollack, MD  lidocaine (XYLOCAINE) 2 % solution Use as directed 10 mLs in the mouth or throat as needed. Patient not taking: Reported on 10/17/2020 10/06/19   Enid Derry, PA-C  magic mouthwash w/lidocaine SOLN Take 10 mLs by mouth 4 (four) times daily as needed for mouth pain. Patient not taking: Reported on 10/17/2020 01/31/20   Kem Boroughs B, FNP  oxyCODONE-acetaminophen (PERCOCET) 5-325 MG tablet Take 1 tablet by mouth every 6 (six) hours as needed for severe  pain. 06/29/23 06/28/24  Shaune Pollack, MD  pantoprazole (PROTONIX) 40 MG tablet Take 1 tablet (40 mg total) by mouth daily. 03/15/22 03/15/23  Minna Antis, MD  RIVAROXABAN Carlena Hurl) VTE STARTER PACK (15 & 20 MG TABLETS) Follow package directions: Take one 15mg  tablet by mouth twice a day. On day 22, switch to one 20mg  tablet once a day. Take with food. 06/17/20   Gilles Chiquito, MD  SPIRIVA HANDIHALER 18 MCG inhalation capsule  08/08/20   [provider]  [facility] 160-4.5 MCG/ACT inhaler  08/08/20   [provider]     Allergies  Onion   Family History  History reviewed. No pertinent family history.   Physical Exam  Triage Vital  Signs: ED Triage Vitals  Encounter Vitals Group     BP 09/03/23 2121 118/74     Systolic BP Percentile --      Diastolic BP Percentile --      Pulse Rate 09/03/23 2121 82     Resp 09/03/23 2121 18     Temp 09/03/23 2121 99 F (37.2 C)     Temp Source 09/03/23 2121 Oral     SpO2 09/03/23 2121 98 %     Weight 09/03/23 2122 300 lb (136.1 kg)     Height 09/03/23 2122 6' (1.829 m)     Head Circumference --      Peak Flow --      Pain Score 09/03/23 2121 7     Pain Loc --      Pain Education --      Exclude from Growth Chart --     Updated Vital Signs: BP 121/80 (BP Location: Left Arm)   Pulse 83   Temp 98.1 F (36.7 C) (Oral)   Resp 17   Ht 6' (1.829 m)   Wt 136.1 kg   SpO2 94%   BMI 40.69 kg/m    General: Awake, no distress.  CV:  RRR.  Good peripheral perfusion.  Resp:  Normal effort.  CTAB. Abd:  Nontender.  No distention.  Other:  Alert and oriented x 3.  CN II-XII grossly intact.  5/5 motor strength and sensation all extremities.   ED Results / Procedures / Treatments  Labs (all labs ordered are listed, but only abnormal results are displayed) Labs Reviewed  BASIC METABOLIC PANEL - Abnormal; Notable for the following components:      Result Value   Sodium 132 (*)    Glucose, Bld 508 (*)    All other components within normal limits  URINALYSIS, ROUTINE W REFLEX MICROSCOPIC - Abnormal; Notable for the following components:   Color, Urine STRAW (*)    APPearance CLEAR (*)    Glucose, UA >=500 (*)    All other components within normal limits  CBG MONITORING, ED - Abnormal; Notable for the following components:   Glucose-Capillary 477 (*)    All other components within normal limits  CBG MONITORING, ED - Abnormal; Notable for the following components:   Glucose-Capillary 336 (*)    All other components within normal limits  CBG MONITORING, ED - Abnormal; Notable for the following components:   Glucose-Capillary 297 (*)    All other components within normal  limits  CBC  CBG MONITORING, ED  TROPONIN I (HIGH SENSITIVITY)     EKG  ED ECG REPORT I, Neelam Tiggs J, the attending physician, personally viewed and interpreted this ECG.   Date: 09/04/2023  EKG Time: 2127  Rate: 77  Rhythm: normal sinus rhythm  Axis: Normal  Intervals:none  ST&T Change: Nonspecific    RADIOLOGY None   Official radiology report(s): No results found.   PROCEDURES:  Critical Care performed: No  Procedures   MEDICATIONS ORDERED IN ED: Medications  sodium chloride 0.9 % bolus 1,000 mL (0 mLs Intravenous Stopped 09/04/23 0210)     IMPRESSION / MDM / ASSESSMENT AND PLAN / ED COURSE  I reviewed the triage vital signs and the nursing notes.                             42 year old male presenting with dizziness.  Differential diagnosis includes but is not limited to ACS, metabolic, infectious etiologies, etc.  I personally reviewed patient's records and note majority ED visits; note hematology office visit from 05/06/2022 for follow-up IVC filter placement, DVT.  Patient's presentation is most consistent with acute illness / injury with system symptoms.  Laboratory results remarkable for hyperglycemia without elevation of anion gap.  Troponin and UA negative for infection.  Repeat glucose 336.  Will administer IV fluids and recheck blood sugar.  Clinical Course as of 09/04/23 8295  Sat Sep 04, 2023  0201 Blood sugar improved.  Strict return precautions given.  Patient and spouse verbalized understanding and agree with plan of care. [JS]    Clinical Course User Index [JS] Irean Hong, MD     FINAL CLINICAL IMPRESSION(S) / ED DIAGNOSES   Final diagnoses:  Hyperglycemia     Rx / DC Orders   ED Discharge Orders          Ordered    metFORMIN (GLUCOPHAGE) 500 MG tablet  2 times daily with meals        09/04/23 0010    Ambulatory referral to Nutrition and Diabetic Education        09/04/23 0203             Note:  This document was  prepared using Dragon voice recognition software and may include unintentional dictation errors.   Irean Hong, MD 09/04/23 [phone]

## 2023-09-08 ENCOUNTER — Emergency Department: Payer: 59

## 2023-09-08 ENCOUNTER — Emergency Department
Admission: EM | Admit: 2023-09-08 | Discharge: 2023-09-08 | Disposition: A | Discharge status: Home / Self Care | Payer: 59 | Attending: Emergency Medicine | Admitting: Emergency Medicine

## 2023-09-08 ENCOUNTER — Other Ambulatory Visit: Payer: Self-pay

## 2023-09-08 DIAGNOSIS — R0989 Other specified symptoms and signs involving the circulatory and respiratory systems: Secondary | ICD-10-CM | POA: Diagnosis not present

## 2023-09-08 DIAGNOSIS — E1165 Type 2 diabetes mellitus with hyperglycemia: Secondary | ICD-10-CM | POA: Diagnosis not present

## 2023-09-08 DIAGNOSIS — R300 Dysuria: Secondary | ICD-10-CM | POA: Diagnosis not present

## 2023-09-08 DIAGNOSIS — E860 Dehydration: Secondary | ICD-10-CM | POA: Diagnosis not present

## 2023-09-08 DIAGNOSIS — J9811 Atelectasis: Secondary | ICD-10-CM | POA: Diagnosis not present

## 2023-09-08 DIAGNOSIS — R1084 Generalized abdominal pain: Secondary | ICD-10-CM | POA: Diagnosis not present

## 2023-09-08 DIAGNOSIS — K760 Fatty (change of) liver, not elsewhere classified: Secondary | ICD-10-CM | POA: Diagnosis not present

## 2023-09-08 DIAGNOSIS — R509 Fever, unspecified: Secondary | ICD-10-CM | POA: Insufficient documentation

## 2023-09-08 DIAGNOSIS — J449 Chronic obstructive pulmonary disease, unspecified: Secondary | ICD-10-CM | POA: Diagnosis not present

## 2023-09-08 DIAGNOSIS — R42 Dizziness and giddiness: Secondary | ICD-10-CM | POA: Diagnosis not present

## 2023-09-08 DIAGNOSIS — N200 Calculus of kidney: Secondary | ICD-10-CM | POA: Diagnosis not present

## 2023-09-08 LAB — LIPASE, BLOOD: Lipase: 52 U/L — ABNORMAL HIGH (ref 11–51)

## 2023-09-08 LAB — HEPATIC FUNCTION PANEL
ALT: 53 U/L — ABNORMAL HIGH (ref 0–44)
AST: 47 U/L — ABNORMAL HIGH (ref 15–41)
Albumin: 3.6 g/dL (ref 3.5–5.0)
Alkaline Phosphatase: 80 U/L (ref 38–126)
Bilirubin, Direct: 0.4 mg/dL — ABNORMAL HIGH (ref 0.0–0.2)
Indirect Bilirubin: 0.7 mg/dL (ref 0.3–0.9)
Total Bilirubin: 1.1 mg/dL (ref <1.2)
Total Protein: 7 g/dL (ref 6.5–8.1)

## 2023-09-08 LAB — BASIC METABOLIC PANEL
Anion gap: 10 (ref 5–15)
BUN: 8 mg/dL (ref 6–20)
CO2: 21 mmol/L — ABNORMAL LOW (ref 22–32)
Calcium: 8.5 mg/dL — ABNORMAL LOW (ref 8.9–10.3)
Chloride: 100 mmol/L (ref 98–111)
Creatinine, Ser: 0.96 mg/dL (ref 0.61–1.24)
GFR, Estimated: >60 mL/min (ref >60)
Glucose, Bld: 314 mg/dL — ABNORMAL HIGH (ref 70–99)
Potassium: 4.6 mmol/L (ref 3.5–5.1)
Sodium: 131 mmol/L — ABNORMAL LOW (ref 135–145)

## 2023-09-08 LAB — CBC
HCT: 44.9 % (ref 39.0–52.0)
Hemoglobin: 15.6 g/dL (ref 13.0–17.0)
MCH: 30.2 pg (ref 26.0–34.0)
MCHC: 34.7 g/dL (ref 30.0–36.0)
MCV: 87 fL (ref 80.0–100.0)
Platelets: 141 10*3/uL — ABNORMAL LOW (ref 150–400)
RBC: 5.16 MIL/uL (ref 4.22–5.81)
RDW: 12.3 % (ref 11.5–15.5)
WBC: 6 10*3/uL (ref 4.0–10.5)
nRBC: 0 % (ref 0.0–0.2)

## 2023-09-08 LAB — BETA-HYDROXYBUTYRIC ACID: Beta-Hydroxybutyric Acid: 0.13 mmol/L (ref 0.05–0.27)

## 2023-09-08 LAB — CBG MONITORING, ED: Glucose-Capillary: 323 mg/dL — ABNORMAL HIGH (ref 70–99)

## 2023-09-08 LAB — TROPONIN I (HIGH SENSITIVITY): Troponin I (High Sensitivity): 5 ng/L (ref <18)

## 2023-09-08 MED ORDER — ONDANSETRON HCL 4 MG/2ML IJ SOLN
4.0000 mg | Freq: Once | INTRAMUSCULAR | Status: AC
  Administered 2023-09-08: 4 mg via INTRAVENOUS
  Filled 2023-09-08: qty 2

## 2023-09-08 MED ORDER — IOHEXOL 350 MG/ML SOLN
100.0000 mL | Freq: Once | INTRAVENOUS | Status: AC | PRN
Start: 2023-09-08 — End: 2023-09-08
  Administered 2023-09-08: 100 mL via INTRAVENOUS

## 2023-09-08 MED ORDER — SODIUM CHLORIDE 0.9 % IV BOLUS
1000.0000 mL | Freq: Once | INTRAVENOUS | Status: AC
  Administered 2023-09-08: 1000 mL via INTRAVENOUS

## 2023-09-08 MED ORDER — MORPHINE SULFATE (PF) 4 MG/ML IV SOLN
4.0000 mg | Freq: Once | INTRAVENOUS | Status: AC
  Administered 2023-09-08: 4 mg via INTRAVENOUS
  Filled 2023-09-08: qty 1

## 2023-09-08 NOTE — ED Notes (Signed)
Pt given graham crackers.

## 2023-09-08 NOTE — ED Notes (Signed)
Pt up to toilet with minimal assistance. Pt. Verbalizes much less dizzy than before, but his chronic lower back pain is still there.

## 2023-09-08 NOTE — ED Triage Notes (Addendum)
Pt to ed from home via POV for dizziness x 5 days. Pt was just seen here Friday for same. Pt states "It hasn't gotten any better so I came back". Pt has filled his metformin since Friday and advised he has gotten worse since he started taking the meds. Pt also states "I dont have a PCP and the ER wrote my first script for the metformin". This RN educated him on high priority of obtaining a PCP to manage his diabetes. Pt then asking for a work note. His note from Friday covered him until Monday but he went to work Monday and then went back home. Pt has multiple complaints listed: back pain, diarrhea, chills, bladder issues. Pt also is having neuropathy symptoms are in the right foot x 3 months. Pt also has HC of DVTS.

## 2023-09-08 NOTE — ED Notes (Signed)
 Blue top sent down.

## 2023-09-08 NOTE — ED Provider Notes (Signed)
[facility] Provider Note    Event Date/Time   First MD Initiated Contact with Patient 09/08/23 1525     (approximate)   History   Chief Complaint: Dizziness (X 5 days)   HPI  Ricardo Wood is a 42 y.o. male with a history of COPD, diabetes, DVT who comes ED complaining of malaise and dizziness for the past 5 days.  Worse with standing up.  Also complains of right sided abdominal pain and dysuria.  We reviewed outside records noting he was recently seen in the ED 3 days ago, at that time was noted he had not been taking any of his medications including anticoagulants and metformin.  Today he reports that he just started taking metformin.  No vomiting diarrhea, but did have subjective fever last night.          Physical Exam   Triage Vital Signs: ED Triage Vitals  Encounter Vitals Group     BP 09/08/23 1345 (!) 129/96     Systolic BP Percentile --      Diastolic BP Percentile --      Pulse Rate 09/08/23 1345 (!) 130     Resp 09/08/23 1345 18     Temp 09/08/23 1345 98 F (36.7 C)     Temp Source 09/08/23 1345 Oral     SpO2 09/08/23 1345 95 %     Weight --      Height 09/08/23 1346 6' (1.829 m)     Head Circumference --      Peak Flow --      Pain Score 09/08/23 1346 0     Pain Loc --      Pain Education --      Exclude from Growth Chart --     Most recent vital signs: Vitals:   09/08/23 1648 09/08/23 2002  BP: 119/80 (!) 126/110  Pulse: (!) 108 96  Resp: 18 18  Temp: 99.3 F (37.4 C) 98 F (36.7 C)  SpO2: 96% 96%    General: Awake, no distress.  CV:  Good peripheral perfusion.  Tachycardia heart rate 120 Resp:  Normal effort.  Clear to auscultation bilaterally Abd:  No distention.  Soft with diffuse right-sided abdominal tenderness and suprapubic tenderness Other:  Somewhat dry oral mucosa.  Normal peripheral pulses.  No rash   ED Results / Procedures / Treatments   Labs (all labs ordered are listed, but only abnormal  results are displayed) Labs Reviewed  BASIC METABOLIC PANEL - Abnormal; Notable for the following components:      Result Value   Sodium 131 (*)    CO2 21 (*)    Glucose, Bld 314 (*)    Calcium 8.5 (*)    All other components within normal limits  CBC - Abnormal; Notable for the following components:   Platelets 141 (*)    All other components within normal limits  LIPASE, BLOOD - Abnormal; Notable for the following components:   Lipase 52 (*)    All other components within normal limits  HEPATIC FUNCTION PANEL - Abnormal; Notable for the following components:   AST 47 (*)    ALT 53 (*)    Bilirubin, Direct 0.4 (*)    All other components within normal limits  CBG MONITORING, ED - Abnormal; Notable for the following components:   Glucose-Capillary 323 (*)    All other components within normal limits  BETA-HYDROXYBUTYRIC ACID  URINALYSIS, W/ REFLEX TO CULTURE (INFECTION SUSPECTED)  TROPONIN  I (HIGH SENSITIVITY)     EKG Interpreted by me Sinus tachycardia rate 125.  Normal axis, normal intervals.  Poor R wave progression.  Normal ST segments and T waves   RADIOLOGY Chest x-ray interpreted by me, appears normal.  Radiology report reviewed  CT angiogram of the chest negative for PE or other acute findings. CT abdomen pelvis unremarkable   PROCEDURES:  Procedures   MEDICATIONS ORDERED IN ED: Medications  sodium chloride 0.9 % bolus 1,000 mL (1,000 mLs Intravenous New Bag/Given 09/08/23 1628)  ondansetron (ZOFRAN) injection 4 mg (4 mg Intravenous Given 09/08/23 1629)  morphine (PF) 4 MG/ML injection 4 mg (4 mg Intravenous Given 09/08/23 1630)  iohexol (OMNIPAQUE) 350 MG/ML injection 100 mL (100 mLs Intravenous Contrast Given 09/08/23 1611)     IMPRESSION / MDM / ASSESSMENT AND PLAN / ED COURSE  I reviewed the triage vital signs and the nursing notes.  DDx: Pneumonia, pulmonary embolism, appendicitis, diverticulitis, viral illness, biliary disease, pancreatitis,  UTI, dehydration, electrolyte abnormality, anemia  Patient's presentation is most consistent with acute presentation with potential threat to life or bodily function.  Patient presents with dizziness, right-sided abdominal pain with tachycardia.  Suspect viral illness versus intra-abdominal pathology.  With his history of poorly controlled diabetes, will obtain CT imaging along with labs, give IV fluids for hydration.   Clinical Course as of 09/08/23 2056  Wed Sep 08, 2023  2054 Feeling, tolerating p.o.  CT reassuring as are the rest of his labs.  Tachycardia improved.  Counseled patient on hydration.  Will request PCP referral. [PS]    Clinical Course User Index [PS] Sharman Cheek, MD     FINAL CLINICAL IMPRESSION(S) / ED DIAGNOSES   Final diagnoses:  Type 2 diabetes mellitus with hyperglycemia, without long-term current use of insulin (HCC)  Dehydration     Rx / DC Orders   ED Discharge Orders          Ordered    Ambulatory Referral to Primary Care (Establish Care)        09/08/23 2055             Note:  This document was prepared using Dragon voice recognition software and may include unintentional dictation errors.   Sharman Cheek, MD 09/08/23 2056

## 2023-09-09 LAB — URINALYSIS, W/ REFLEX TO CULTURE (INFECTION SUSPECTED)
Bacteria, UA: NONE SEEN
Bilirubin Urine: NEGATIVE
Glucose, UA: >=500 mg/dL — AB
Hgb urine dipstick: NEGATIVE
Ketones, ur: NEGATIVE mg/dL
Leukocytes,Ua: NEGATIVE
Nitrite: NEGATIVE
Protein, ur: NEGATIVE mg/dL
RBC / HPF: 0 RBC/hpf (ref 0–5)
Specific Gravity, Urine: 1.033 — ABNORMAL HIGH (ref 1.005–1.030)
pH: 5 (ref 5.0–8.0)

## 2023-09-24 ENCOUNTER — Telehealth: Payer: Self-pay | Admitting: *Deleted

## 2023-09-24 NOTE — Progress Notes (Signed)
Transition Care Management Unsuccessful Follow-up Telephone Call  Date of discharge and from where:  [facility]  09/08/2023  Attempts:  1st Attempt  Reason for unsuccessful TCM follow-up call:  No answer/busy

## 2023-09-30 ENCOUNTER — Other Ambulatory Visit: Payer: Self-pay

## 2023-09-30 ENCOUNTER — Encounter: Payer: Self-pay | Admitting: Emergency Medicine

## 2023-09-30 DIAGNOSIS — Z7984 Long term (current) use of oral hypoglycemic drugs: Secondary | ICD-10-CM | POA: Insufficient documentation

## 2023-09-30 DIAGNOSIS — K029 Dental caries, unspecified: Secondary | ICD-10-CM | POA: Insufficient documentation

## 2023-09-30 DIAGNOSIS — E119 Type 2 diabetes mellitus without complications: Secondary | ICD-10-CM | POA: Diagnosis not present

## 2023-09-30 DIAGNOSIS — K0889 Other specified disorders of teeth and supporting structures: Secondary | ICD-10-CM | POA: Insufficient documentation

## 2023-09-30 DIAGNOSIS — K047 Periapical abscess without sinus: Secondary | ICD-10-CM | POA: Diagnosis not present

## 2023-09-30 NOTE — ED Triage Notes (Signed)
 Pt sts concern dental pain with left facial pain. Reports loose teeth awaiting dental insurance.

## 2023-10-01 ENCOUNTER — Emergency Department
Admission: EM | Admit: 2023-10-01 | Discharge: 2023-10-01 | Disposition: A | Discharge status: Home / Self Care | Payer: 59 | Attending: Emergency Medicine | Admitting: Emergency Medicine

## 2023-10-01 DIAGNOSIS — K029 Dental caries, unspecified: Secondary | ICD-10-CM

## 2023-10-01 DIAGNOSIS — K047 Periapical abscess without sinus: Secondary | ICD-10-CM

## 2023-10-01 MED ORDER — IBUPROFEN 800 MG PO TABS: 800.0000 mg | ORAL_TABLET | Freq: Three times a day (TID) | ORAL | 0 refills | Status: DC | PRN

## 2023-10-01 MED ORDER — PENICILLIN V POTASSIUM 500 MG PO TABS
500.0000 mg | ORAL_TABLET | Freq: Once | ORAL | Status: AC
Start: 2023-10-01 — End: 2023-10-01
  Administered 2023-10-01: 500 mg via ORAL
  Filled 2023-10-01: qty 1

## 2023-10-01 MED ORDER — ONDANSETRON 4 MG PO TBDP
4.0000 mg | ORAL_TABLET | Freq: Once | ORAL | Status: AC
  Administered 2023-10-01: 4 mg via ORAL
  Filled 2023-10-01: qty 1

## 2023-10-01 MED ORDER — IBUPROFEN 800 MG PO TABS
800.0000 mg | ORAL_TABLET | Freq: Once | ORAL | Status: AC
  Administered 2023-10-01: 800 mg via ORAL
  Filled 2023-10-01: qty 1

## 2023-10-01 MED ORDER — PENICILLIN V POTASSIUM 500 MG PO TABS: 500.0000 mg | ORAL_TABLET | Freq: Four times a day (QID) | ORAL | 0 refills | Status: DC

## 2023-10-01 MED ORDER — PENICILLIN V POTASSIUM 500 MG PO TABS
500.0000 mg | ORAL_TABLET | Freq: Four times a day (QID) | ORAL | 0 refills | Status: DC
Start: 2023-10-01 — End: 2023-12-24

## 2023-10-01 MED ORDER — ONDANSETRON 4 MG PO TBDP: 4.0000 mg | ORAL_TABLET | Freq: Four times a day (QID) | ORAL | 0 refills | Status: DC | PRN

## 2023-10-01 MED ORDER — OXYCODONE-ACETAMINOPHEN 5-325 MG PO TABS
2.0000 | ORAL_TABLET | Freq: Once | ORAL | Status: AC
  Administered 2023-10-01: 2 via ORAL
  Filled 2023-10-01: qty 2

## 2023-10-01 MED ORDER — OXYCODONE-ACETAMINOPHEN 5-325 MG PO TABS: 2.0000 | ORAL_TABLET | Freq: Three times a day (TID) | ORAL | 0 refills | Status: DC | PRN

## 2023-10-01 NOTE — ED Provider Notes (Signed)
 [facility] Provider Note    Event Date/Time   First MD Initiated Contact with Patient 10/01/23 0101     (approximate)   History   Dental Pain and Facial Pain   HPI  Ricardo Wood is a 43 y.o. 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  He feels like he has swelling in his gums and states that occasionally will have purulent drainage.  Reports fever of 101.  Does not have a local dentist.  Feels like the left side of his face has been swollen.  No difficulty swallowing, speaking or breathing.   History provided by patient, significant other.    Past Medical History:  Diagnosis Date   Diabetes mellitus without complication (HCC)    DVT (deep venous thrombosis) (HCC)    Kidney stones    Seizures (HCC)     Past Surgical History:  Procedure Laterality Date   dvt filter      MEDICATIONS:  Prior to Admission medications   Medication Sig Start Date End Date Taking? Authorizing Provider  albuterol  (VENTOLIN  HFA) 108 (90 Base) MCG/ACT inhaler Inhale 2 puffs into the lungs every 6 (six) hours as needed for wheezing or shortness of breath. 08/26/21   Edelmiro Leash, MD  amoxicillin  (AMOXIL ) 500 MG capsule Take 1 capsule (500 mg total) by mouth 3 (three) times daily. 03/11/23   Sung, Jade J, MD  chlorhexidine  (PERIDEX ) 0.12 % solution Use as directed 15 mLs in the mouth or throat 4 (four) times daily. 10/19/21   Edelmiro Leash, MD  ELIQUIS  5 MG TABS tablet  08/08/20   [provider]  famotidine  (PEPCID ) 20 MG tablet Take 1 tablet (20 mg total) by mouth 2 (two) times daily. 03/05/23   Sung, Jade J, MD  HYDROcodone -acetaminophen  (NORCO) 5-325 MG tablet Take 1 tablet by mouth every 6 (six) hours as needed for moderate pain. 03/11/23   Sung, Jade J, MD  hydrocortisone  2.5 % lotion  Apply topically 2 (two) times daily. 01/31/20   Triplett, Cari B, FNP  hydrOXYzine  (ATARAX /VISTARIL ) 25 MG tablet Take 1 tablet (25 mg total) by mouth 3 (three) times daily as needed for itching. 01/31/20   Triplett, Cari B, FNP  ibuprofen  (ADVIL ) 600 MG tablet Take 1 tablet (600 mg total) by mouth every 8 (eight) hours as needed for moderate pain. 06/29/23   Angelena Smalls, MD  lidocaine  (XYLOCAINE ) 2 % solution Use as directed 10 mLs in the mouth or throat as needed. Patient not taking: Reported on 10/17/2020 10/06/19   Alona Knee, PA-C  magic mouthwash w/lidocaine  SOLN Take 10 mLs by mouth 4 (four) times daily as needed for mouth pain. Patient not taking: Reported on 10/17/2020 01/31/20   Triplett, Cari B, FNP  metFORMIN  (GLUCOPHAGE ) 500 MG tablet Take 2 tablets (1,000 mg total) by mouth 2 (two) times daily with a meal. 09/04/23   Sung, Jade J, MD  oxyCODONE -acetaminophen  (PERCOCET) 5-325 MG tablet Take 1 tablet by mouth every 6 (six) hours as needed for severe pain. 06/29/23 06/28/24  Angelena Smalls, MD  pantoprazole  (PROTONIX ) 40 MG tablet Take 1 tablet (40 mg total) by mouth daily. 03/15/22 03/15/23  Dorothyann Drivers, MD  RIVAROXABAN  (XARELTO ) VTE STARTER PACK (15 & 20 MG TABLETS) Follow package directions: Take one 15mg  tablet by mouth twice a day. On day 22, switch  to one 20mg  tablet once a day. Take with food. 06/17/20   Claudene Arthea SQUIBB, MD  SPIRIVA HANDIHALER 18 MCG inhalation capsule  08/08/20   [provider]  [facility] 160-4.5 MCG/ACT inhaler  08/08/20   [provider]    Physical Exam   Triage Vital Signs: ED Triage Vitals [09/30/23 2126]  Encounter Vitals Group     BP (!) 162/96     Systolic BP Percentile      Diastolic BP Percentile      Pulse Rate 67     Resp 19     Temp 98.7 F (37.1 C)     Temp Source Oral     SpO2 93 %     Weight 300 lb (136.1 kg)     Height 6' (1.829 m)     Head Circumference      Peak Flow      Pain Score 9     Pain Loc      Pain  Education      Exclude from Growth Chart     Most recent vital signs: Vitals:   09/30/23 2126 10/01/23 0112  BP: (!) 162/96 (!) 134/95  Pulse: 67 83  Resp: 19 15  Temp: 98.7 F (37.1 C) 98.2 F (36.8 C)  SpO2: 93% 93%    CONSTITUTIONAL: Alert, responds appropriately to questions.  Peers uncomfortable but afebrile, nontoxic HEAD: Normocephalic, atraumatic EYES: Conjunctivae clear, pupils appear equal, sclera nonicteric ENT: normal nose; moist mucous membranes, no facial swelling appreciable on exam but tender over the maxillary sinuses, no facial redness or warmth, multiple dental caries noted with some swelling of the gums but no obvious drainable abscess.  No Ludwig's angina.  No cervical lymphadenopathy.  Tongue sits flat in the bottom of the mouth.  No trismus, drooling.  Normal phonation.  Airway patent. NECK: Supple, normal ROM CARD: RRR; S1 and S2 appreciated RESP: Normal chest excursion without splinting or tachypnea; breath sounds clear and equal bilaterally; no wheezes, no rhonchi, no rales, no hypoxia or respiratory distress, speaking full sentences ABD/GI: Non-distended; soft, non-tender, no rebound, no guarding, no peritoneal signs BACK: The back appears normal EXT: Normal ROM in all joints; no deformity noted, no edema SKIN: Normal color for age and race; warm; no rash on exposed skin NEURO: Moves all extremities equally, normal speech PSYCH: The patient's mood and manner are appropriate.   ED Results / Procedures / Treatments   LABS: (all labs ordered are listed, but only abnormal results are displayed) Labs Reviewed - No data to display   EKG:   RADIOLOGY: My personal review and interpretation of imaging:    I have personally reviewed all radiology reports.   No results found.   PROCEDURES:  Critical Care performed: No      Procedures    IMPRESSION / MDM / ASSESSMENT AND PLAN / ED COURSE  I reviewed the triage vital signs and the nursing  notes.    Patient here with dental abscess, dental caries.       DIFFERENTIAL DIAGNOSIS (includes but not limited to):   Dental caries, dental abscess, no sign of Ludwick's, no sign of facial cellulitis   Patient's presentation is most consistent with acute complicated illness / injury requiring diagnostic workup.   PLAN: Will start on antibiotics and provide with pain medication.  Given outpatient dental resources.  Airway patent.  No signs of sepsis today.  No sign of facial cellulitis.  No drainable abscess on exam.  MEDICATIONS GIVEN IN ED: Medications  penicillin  v potassium (VEETID) tablet 500 mg (500 mg Oral Given 10/01/23 0115)  ibuprofen  (ADVIL ) tablet 800 mg (800 mg Oral Given 10/01/23 0115)  oxyCODONE -acetaminophen  (PERCOCET/ROXICET) 5-325 MG per tablet 2 tablet (2 tablets Oral Given 10/01/23 0115)  ondansetron  (ZOFRAN -ODT) disintegrating tablet 4 mg (4 mg Oral Given 10/01/23 0115)     ED COURSE:  At this time, I do not feel there is any life-threatening condition present. I reviewed all nursing notes, vitals, pertinent previous records.  All lab and urine results, EKGs, imaging ordered have been independently reviewed and interpreted by myself.  I reviewed all available radiology reports from any imaging ordered this visit.  Based on my assessment, I feel the patient is safe to be discharged home without further emergent workup and can continue workup as an outpatient as needed. Discussed all findings, treatment plan as well as usual and customary return precautions.  They verbalize understanding and are comfortable with this plan.  Outpatient follow-up has been provided as needed.  All questions have been answered.    CONSULTS:  none   OUTSIDE RECORDS REVIEWED: Reviewed last hematology note at [facility] in August 2023.       FINAL CLINICAL IMPRESSION(S) / ED DIAGNOSES   Final diagnoses:  Dental caries  Dental infection     Rx / DC Orders   ED Discharge Orders           Ordered    penicillin  v potassium (VEETID) 500 MG tablet  4 times daily,   Status:  Discontinued        10/01/23 0113    ibuprofen  (ADVIL ) 800 MG tablet  Every 8 hours PRN        10/01/23 0113    oxyCODONE -acetaminophen  (PERCOCET) 5-325 MG tablet  Every 8 hours PRN        10/01/23 0113    ondansetron  (ZOFRAN -ODT) 4 MG disintegrating tablet  Every 6 hours PRN        10/01/23 0113    penicillin  v potassium (VEETID) 500 MG tablet  4 times daily        10/01/23 0128             Note:  This document was prepared using Dragon voice recognition software and may include unintentional dictation errors.   Aislee Landgren, Josette SAILOR, DO 10/01/23 0207

## 2023-10-08 ENCOUNTER — Telehealth: Payer: Self-pay

## 2023-10-08 NOTE — Progress Notes (Signed)
 Transition Care Management Unsuccessful Follow-up Telephone Call  Date of discharge and from where:  10/01/2023 [facility]  Attempts:  2nd Attempt  Reason for unsuccessful TCM follow-up call:  No answer/busy  Jessy Cybulski [facility]  [facility], [facility] Guide Direct Dial: [phone]  Website: [email or web address]

## 2023-10-08 NOTE — Progress Notes (Signed)
 Transition Care Management Unsuccessful Follow-up Telephone Call  Date of discharge and from where:  10/01/2023 [facility]  Attempts:  1st Attempt  Reason for unsuccessful TCM follow-up call:  No answer/busy  Laquana Villari [facility]  [facility], [facility] Guide Direct Dial: [phone]  Website: [email or web address]

## 2023-10-12 DIAGNOSIS — Z791 Long term (current) use of non-steroidal anti-inflammatories (NSAID): Secondary | ICD-10-CM | POA: Diagnosis not present

## 2023-10-12 DIAGNOSIS — E1151 Type 2 diabetes mellitus with diabetic peripheral angiopathy without gangrene: Secondary | ICD-10-CM | POA: Diagnosis not present

## 2023-10-12 DIAGNOSIS — Z87891 Personal history of nicotine dependence: Secondary | ICD-10-CM | POA: Diagnosis not present

## 2023-10-12 DIAGNOSIS — Z91018 Allergy to other foods: Secondary | ICD-10-CM | POA: Diagnosis not present

## 2023-10-12 DIAGNOSIS — Z5941 Food insecurity: Secondary | ICD-10-CM | POA: Diagnosis not present

## 2023-10-12 DIAGNOSIS — E11630 Type 2 diabetes mellitus with periodontal disease: Secondary | ICD-10-CM | POA: Diagnosis not present

## 2023-10-12 DIAGNOSIS — Z809 Family history of malignant neoplasm, unspecified: Secondary | ICD-10-CM | POA: Diagnosis not present

## 2023-10-12 DIAGNOSIS — G40909 Epilepsy, unspecified, not intractable, without status epilepticus: Secondary | ICD-10-CM | POA: Diagnosis not present

## 2023-10-12 DIAGNOSIS — Z7984 Long term (current) use of oral hypoglycemic drugs: Secondary | ICD-10-CM | POA: Diagnosis not present

## 2023-10-12 DIAGNOSIS — Z8249 Family history of ischemic heart disease and other diseases of the circulatory system: Secondary | ICD-10-CM | POA: Diagnosis not present

## 2023-10-12 DIAGNOSIS — Z5948 Other specified lack of adequate food: Secondary | ICD-10-CM | POA: Diagnosis not present

## 2023-11-14 DIAGNOSIS — R519 Headache, unspecified: Secondary | ICD-10-CM | POA: Diagnosis not present

## 2023-11-14 DIAGNOSIS — K047 Periapical abscess without sinus: Secondary | ICD-10-CM | POA: Diagnosis not present

## 2023-11-14 DIAGNOSIS — E1165 Type 2 diabetes mellitus with hyperglycemia: Secondary | ICD-10-CM | POA: Diagnosis not present

## 2023-11-14 DIAGNOSIS — R0602 Shortness of breath: Secondary | ICD-10-CM | POA: Diagnosis not present

## 2023-11-14 DIAGNOSIS — Z86718 Personal history of other venous thrombosis and embolism: Secondary | ICD-10-CM | POA: Diagnosis not present

## 2023-11-14 DIAGNOSIS — R0789 Other chest pain: Secondary | ICD-10-CM | POA: Diagnosis not present

## 2023-11-14 DIAGNOSIS — H538 Other visual disturbances: Secondary | ICD-10-CM | POA: Diagnosis not present

## 2023-11-14 DIAGNOSIS — Z7984 Long term (current) use of oral hypoglycemic drugs: Secondary | ICD-10-CM | POA: Diagnosis not present

## 2023-11-14 DIAGNOSIS — J811 Chronic pulmonary edema: Secondary | ICD-10-CM | POA: Diagnosis not present

## 2023-11-14 DIAGNOSIS — K029 Dental caries, unspecified: Secondary | ICD-10-CM | POA: Diagnosis not present

## 2023-11-14 DIAGNOSIS — R42 Dizziness and giddiness: Secondary | ICD-10-CM | POA: Diagnosis not present

## 2023-11-18 ENCOUNTER — Ambulatory Visit: Payer: 59 | Admitting: Dietician

## 2023-12-22 IMAGING — CT CT ABD-PELV W/ CM
2 of 4 series · 16 of 46 positions shown, 18 images · IV contrast (agent unspecified)
Comparison: CT abdomen and pelvis 08/26/2021

CLINICAL DATA: Abdominal pain

EXAM:
CT ABDOMEN AND PELVIS WITH CONTRAST
TECHNIQUE: Multidetector CT imaging of the abdomen and pelvis was performed
using the standard protocol following bolus administration of
intravenous contrast.

[Series 5: abdomen 5.0 · axial · 0.98mm/px · z∈[-1144,-679]mm · 13 of 107 slices shown, 15 images]
[im 7/107  soft-tissue]
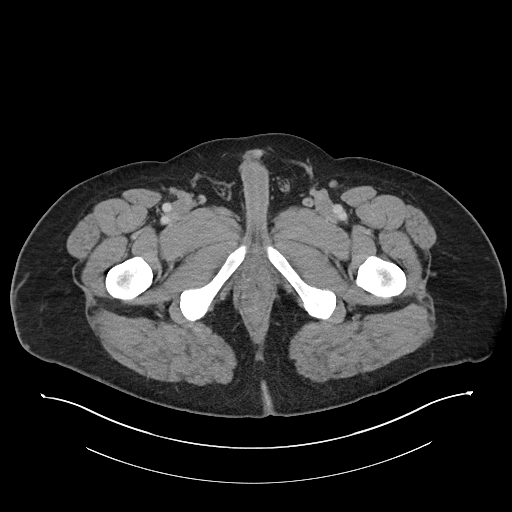
[im 7/107  bone]
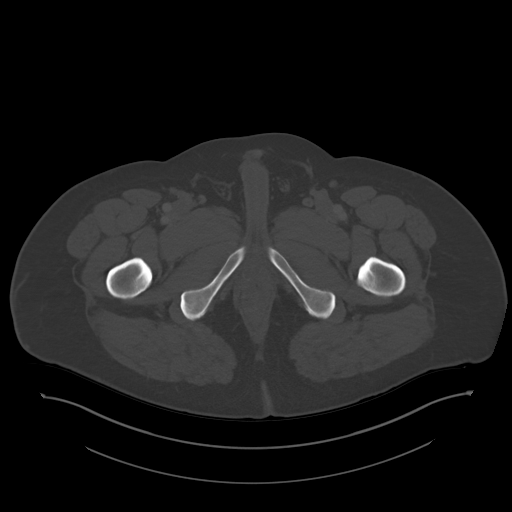
[im 14/107  soft-tissue]
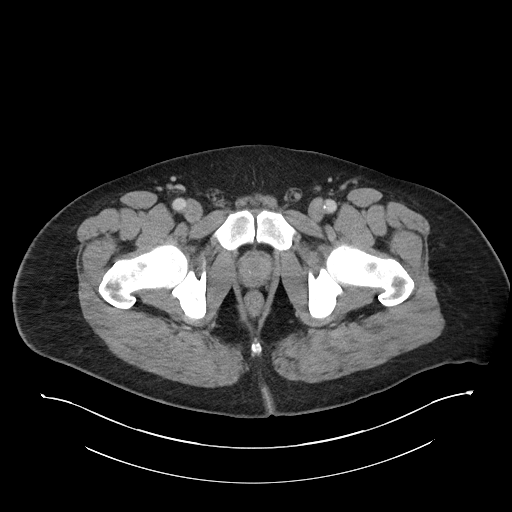
[im 20/107  soft-tissue]
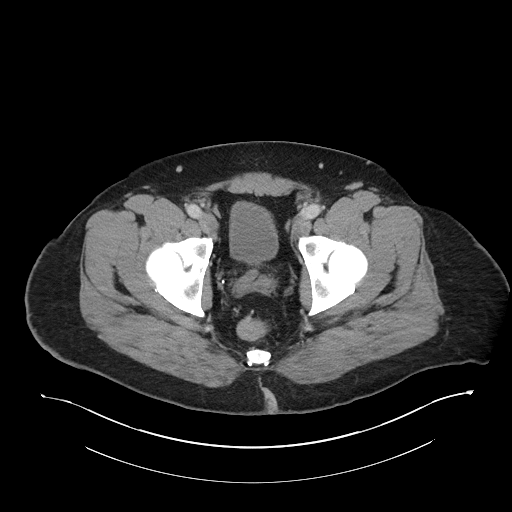
[im 34/107  soft-tissue]
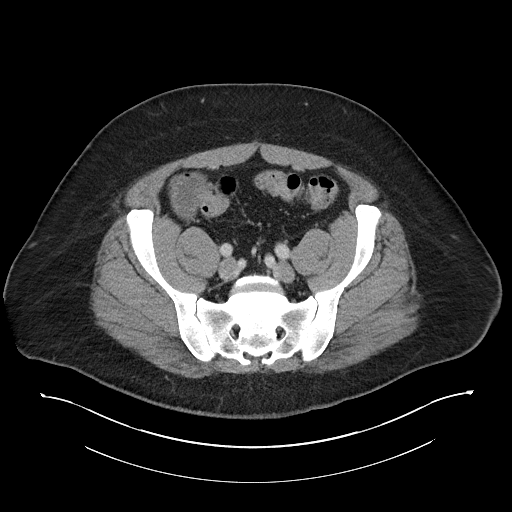
[im 40/107  soft-tissue]
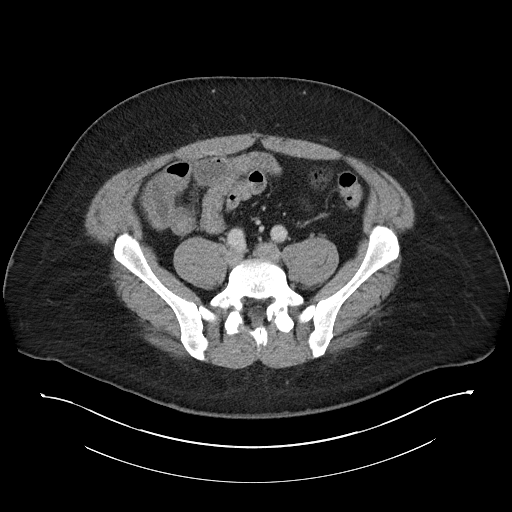
[im 47/107  soft-tissue]
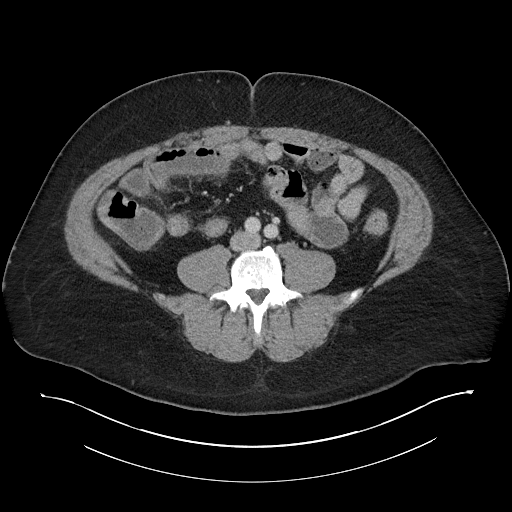
[im 54/107  soft-tissue]
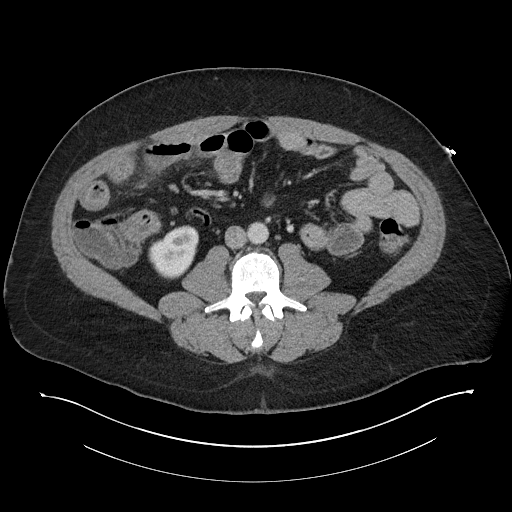
[im 60/107  soft-tissue]
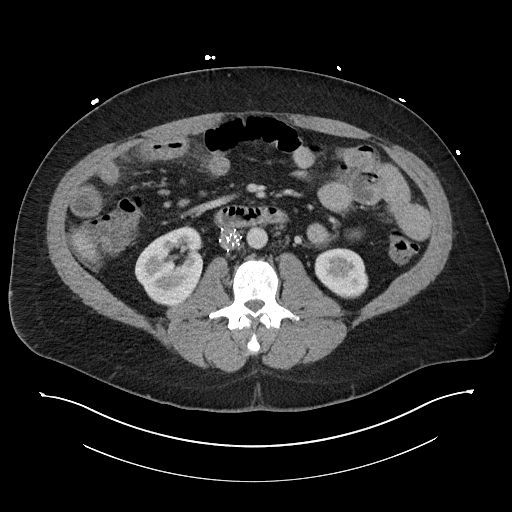
[im 67/107  soft-tissue]
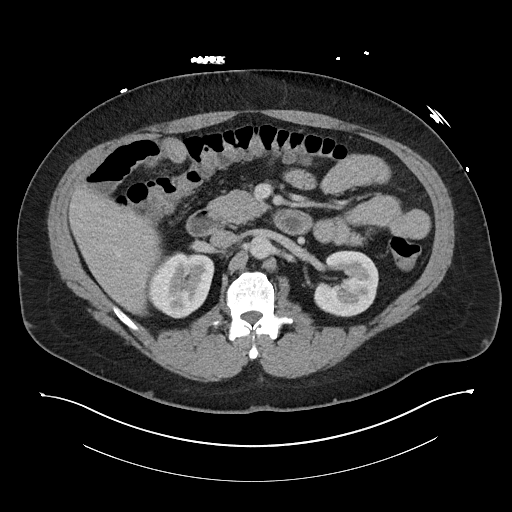
[im 67/107  bone]
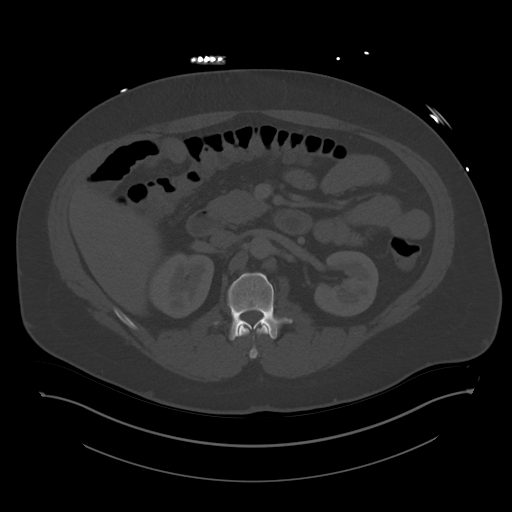
[im 73/107  soft-tissue]
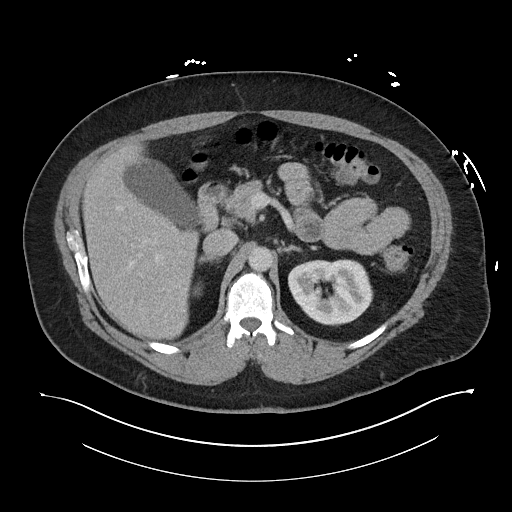
[im 87/107  soft-tissue]
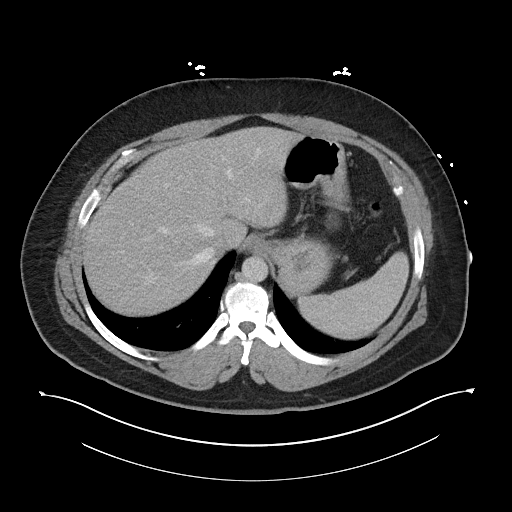
[im 93/107  soft-tissue]
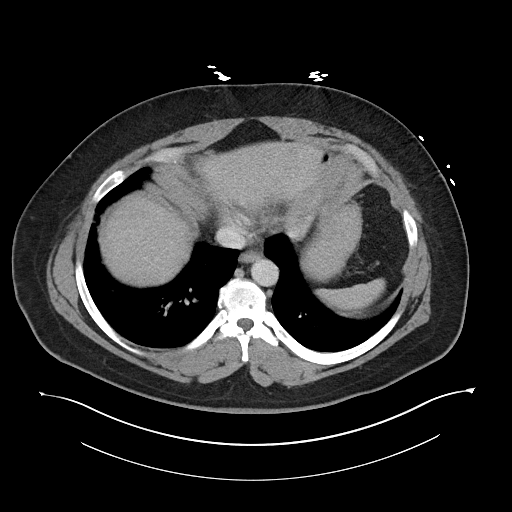
[im 100/107  soft-tissue]
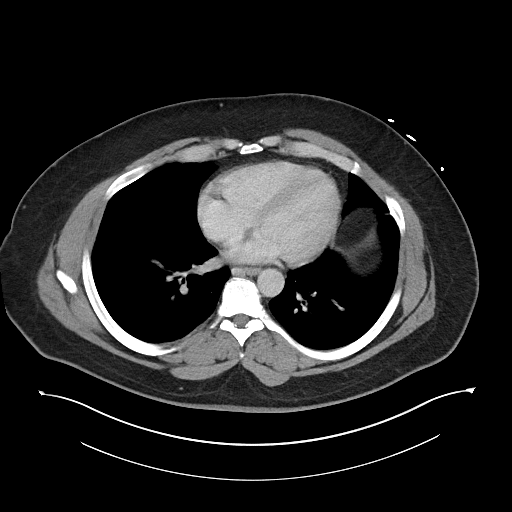

[Series 8: abdomen 3.0 mpr cor · coronal · 0.97mm/px · 3 of 96 slices shown]
[im 32/96  soft-tissue]
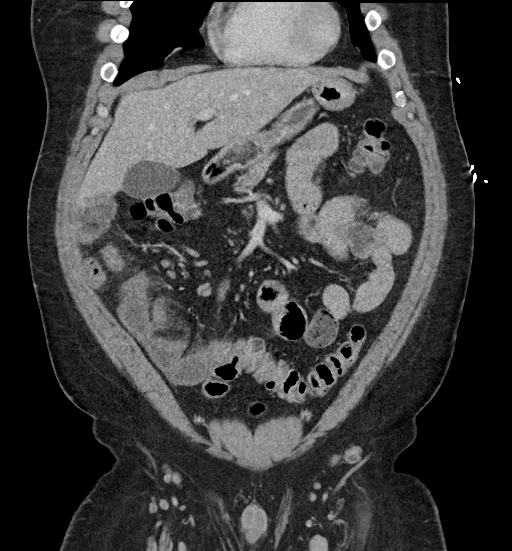
[im 43/96  soft-tissue]
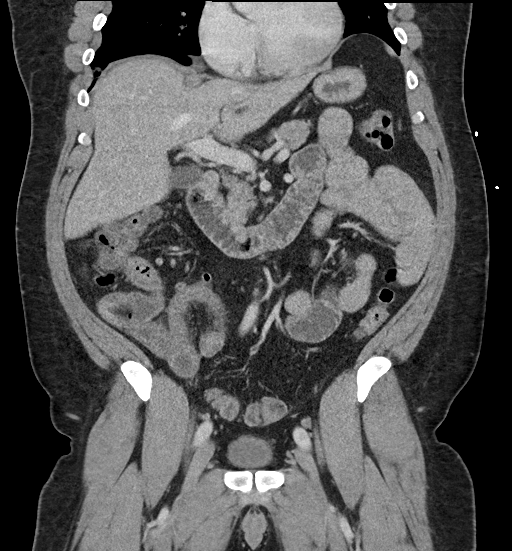
[im 53/96  soft-tissue]
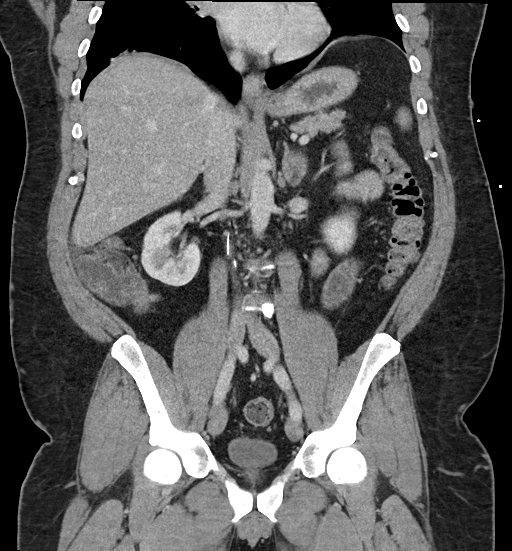

[16 of 46 positions shown; findings below may reference images not displayed]

RADIATION DOSE REDUCTION: This exam was performed according to the
departmental dose-optimization program which includes automated
exposure control, adjustment of the mA and/or kV according to
patient size and/or use of iterative reconstruction technique.

CONTRAST:  100mL OMNIPAQUE IOHEXOL 350 MG/ML SOLN
FINDINGS: Lower chest: Subsegmental atelectatic changes in the lung bases.

Hepatobiliary: Liver is enlarged measuring 19.1 cm in length.
Suggestion of hepatic steatosis with no focal mass identified.
Gallbladder appears normal. No biliary ductal dilatation identified.

Pancreas: Unremarkable. No pancreatic ductal dilatation or
surrounding inflammatory changes.

Spleen: Normal in size without focal abnormality.

Adrenals/Urinary Tract: Adrenal glands are normal. 6 mm calculus in
the mid right kidney. No suspicious renal mass or hydronephrosis
identified bilaterally. Urinary bladder appears grossly normal.

Stomach/Bowel: No bowel obstruction, free air or pneumatosis. No
bowel wall edema identified. Appendix is normal.

Vascular/Lymphatic: No significant vascular abnormalities are
present. IVC filter noted. No enlarged abdominal or pelvic lymph
nodes.

Reproductive: Prostate is unremarkable.

Other: No ascites.

Musculoskeletal: Bilateral spondylolysis of L5 with grade 1
anterolisthesis of L5 on S1. No suspicious bony lesions identified.
IMPRESSION: 1. No acute process identified in the abdomen or pelvis.
2. Hepatomegaly and evidence of hepatic steatosis.
3. Right renal calculus.
4. Other chronic findings as described.

## 2023-12-23 ENCOUNTER — Ambulatory Visit: Payer: Self-pay | Admitting: *Deleted

## 2023-12-23 NOTE — Telephone Encounter (Signed)
 Copied from CRM [id number]. Topic: Clinical - Red Word Triage >> Dec 23, 2023  1:59 PM Nyra Capes wrote: Red Word that prompted transfer to Nurse Triage: Patient called in calling to confirm appt date and time. Patient is asking about  insurance, [address] and [employer].  Patient is diabetes, patient has not taken metFORMIN (GLUCOPHAGE) 500 MG tablet for 3 weeks, patient stated urine has sugar crystals, and has a headache. Reason for Disposition  [1] MODERATE headache (e.g., interferes with normal activities) AND [2] present > 24 hours AND [3] unexplained  (Exceptions: analgesics not tried, typical migraine, or headache part of viral illness)  Answer Assessment - Initial Assessment Questions 1. LOCATION: "Where does it hurt?"      I'm having headaches and dizziness and sugar is visible  in my urine.   He has a new pt appt with Larae Grooms, NP at [facility] at 8:00 in the morning. 12/24/2023.   I've been off my metformin for 3 weeks now.    I don't have a glucose meter so can't check my glucose.   I can see the crystals in my urine. 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   Can provider write me a note for being out of work today?   I let him know that would be up to the provider but to mention it to her.   2. ONSET: "When did the headache start?" (Minutes, hours or days)      Have a headache.   3. PATTERN: "Does the pain come and go, or has it been constant since it started?"     It's constant 4. SEVERITY: "How bad is the pain?" and "What does it keep you from doing?"  (e.g., Scale 1-10; mild, moderate, or severe)   - MILD (1-3): doesn't interfere with normal activities    - MODERATE (4-7): interferes with normal activities or awakens from sleep    - SEVERE (8-10): excruciating pain, unable to do any normal activities        Mild headache 5. RECURRENT SYMPTOM: "Have you ever had headaches before?" If Yes, ask: "When was the last time?" and "What happened that  time?"      Not asked 6. CAUSE: "What do you think is causing the headache?"     My sugar being high and I'm dizzy. 7. MIGRAINE: "Have you been diagnosed with migraine headaches?" If Yes, ask: "Is this headache similar?"      Not asked 8. HEAD INJURY: "Has there been any recent injury to the head?"      Not asked 9. OTHER SYMPTOMS: "Do you have any other symptoms?" (fever, stiff neck, eye pain, sore throat, cold symptoms)     Crystals in my urine and dizziness.  I've been off my metformin for 3 weeks.   10. PREGNANCY: "Is there any chance you are pregnant?" "When was your last menstrual period?"       N/A  Protocols used: Headache-A-AH  Chief Complaint: Crystals in urine, off metformin for 3 weeks, dizziness and headache.    Symptoms: above Frequency: now since Pertinent Negatives: Patient denies being able to check his glucose.   No meter Disposition: [] ED /[] Urgent Care (no appt availability in office) / [] Appointment(In office/virtual)/ []  [place] [facility]/ [] Home Care/ [] Refused Recommended Disposition /[] [place] [facility]/ []  Follow-up with PCP Additional Notes: Has appt in the morning at 8:00 with Mackey Birchwood at [facility]

## 2023-12-23 NOTE — Progress Notes (Unsigned)
 There were no vitals taken for this visit.   Subjective:    Patient ID: Ricardo Wood, male    DOB: 1981/06/01, 43 y.o.   MRN: [id number]  HPI: Ricardo Wood is a 43 y.o. male  No chief complaint on file.  Patient presents to clinic to establish care with new PCP.  Introduced to Publishing rights manager role and practice setting.  All questions answered.  Discussed provider/patient relationship and expectations.  Patient reports a history of ***. Patient denies a history of: Hypertension, Elevated Cholesterol, Diabetes, Thyroid problems, Depression, Anxiety, Neurological problems, and Abdominal problems.   Active Ambulatory Problems    Diagnosis Date Noted   Chest pain 07/25/2019   Chronic deep vein thrombosis (DVT) (HCC) 10/14/2020   Lymphedema 12/11/2020   COPD (chronic obstructive pulmonary disease) (HCC) 12/11/2020   Resolved Ambulatory Problems    Diagnosis Date Noted   No Resolved Ambulatory Problems   Past Medical History:  Diagnosis Date   Diabetes mellitus without complication (HCC)    DVT (deep venous thrombosis) (HCC)    Kidney stones    Seizures (HCC)    Past Surgical History:  Procedure Laterality Date   dvt filter     No family history on file.   Review of Systems  Per HPI unless specifically indicated above     Objective:    There were no vitals taken for this visit.  Wt Readings from Last 3 Encounters:  09/30/23 300 lb (136.1 kg)  09/03/23 300 lb (136.1 kg)  06/29/23 295 lb (133.8 kg)    Physical Exam  Results for orders placed or performed during the hospital encounter of 09/08/23  CBG monitoring, ED   Collection Time: 09/08/23  1:45 PM  Result Value Ref Range   Glucose-Capillary 323 (H) 70 - 99 mg/dL   Comment 1 Notify RN    Comment 2 Document in Chart   Basic metabolic panel   Collection Time: 09/08/23  1:49 PM  Result Value Ref Range   Sodium 131 (L) 135 - 145 mmol/L   Potassium 4.6 3.5 - 5.1 mmol/L   Chloride 100 98 - 111  mmol/L   CO2 21 (L) 22 - 32 mmol/L   Glucose, Bld 314 (H) 70 - 99 mg/dL   BUN 8 6 - 20 mg/dL   Creatinine, Ser 1.30 0.61 - 1.24 mg/dL   Calcium 8.5 (L) 8.9 - 10.3 mg/dL   GFR, Estimated >86 >57 mL/min   Anion gap 10 5 - 15  CBC   Collection Time: 09/08/23  1:49 PM  Result Value Ref Range   WBC 6.0 4.0 - 10.5 K/uL   RBC 5.16 4.22 - 5.81 MIL/uL   Hemoglobin 15.6 13.0 - 17.0 g/dL   HCT 84.6 96.2 - 95.2 %   MCV 87.0 80.0 - 100.0 fL   MCH 30.2 26.0 - 34.0 pg   MCHC 34.7 30.0 - 36.0 g/dL   RDW 84.1 32.4 - 40.1 %   Platelets 141 (L) 150 - 400 K/uL   nRBC 0.0 0.0 - 0.2 %  Beta-hydroxybutyric acid   Collection Time: 09/08/23  1:49 PM  Result Value Ref Range   Beta-Hydroxybutyric Acid 0.13 0.05 - 0.27 mmol/L  Lipase, blood   Collection Time: 09/08/23  1:49 PM  Result Value Ref Range   Lipase 52 (H) 11 - 51 U/L  Hepatic function panel   Collection Time: 09/08/23  1:49 PM  Result Value Ref Range   Total Protein 7.0 6.5 - 8.1  g/dL   Albumin 3.6 3.5 - 5.0 g/dL   AST 47 (H) 15 - 41 U/L   ALT 53 (H) 0 - 44 U/L   Alkaline Phosphatase 80 38 - 126 U/L   Total Bilirubin 1.1 <1.2 mg/dL   Bilirubin, Direct 0.4 (H) 0.0 - 0.2 mg/dL   Indirect Bilirubin 0.7 0.3 - 0.9 mg/dL  Troponin I (High Sensitivity)   Collection Time: 09/08/23  1:49 PM  Result Value Ref Range   Troponin I (High Sensitivity) 5 <18 ng/L  Urinalysis, w/ Reflex to Culture (Infection Suspected) -Urine, Clean Catch   Collection Time: 09/08/23  3:00 PM  Result Value Ref Range   Specimen Source URINE, RANDOM    Color, Urine YELLOW (A) YELLOW   APPearance CLOUDY (A) CLEAR   Specific Gravity, Urine 1.033 (H) 1.005 - 1.030   pH 5.0 5.0 - 8.0   Glucose, UA >=500 (A) NEGATIVE mg/dL   Hgb urine dipstick NEGATIVE NEGATIVE   Bilirubin Urine NEGATIVE NEGATIVE   Ketones, ur NEGATIVE NEGATIVE mg/dL   Protein, ur NEGATIVE NEGATIVE mg/dL   Nitrite NEGATIVE NEGATIVE   Leukocytes,Ua NEGATIVE NEGATIVE   RBC / HPF 0 0 - 5 RBC/hpf    WBC, UA 0-5 0 - 5 WBC/hpf   Bacteria, UA NONE SEEN NONE SEEN   Squamous Epithelial / HPF 0-5 0 - 5 /HPF   Mucus PRESENT       Assessment & Plan:   Problem List Items Addressed This Visit   None    Follow up plan: No follow-ups on file.

## 2023-12-24 ENCOUNTER — Ambulatory Visit (INDEPENDENT_AMBULATORY_CARE_PROVIDER_SITE_OTHER): Payer: 59 | Admitting: Nurse Practitioner

## 2023-12-24 ENCOUNTER — Telehealth: Payer: Self-pay

## 2023-12-24 ENCOUNTER — Encounter: Payer: Self-pay | Admitting: Nurse Practitioner

## 2023-12-24 VITALS — BP 127/91 | HR 86 | Ht 71.3 in | Wt 311.6 lb

## 2023-12-24 DIAGNOSIS — M25511 Pain in right shoulder: Secondary | ICD-10-CM

## 2023-12-24 DIAGNOSIS — E119 Type 2 diabetes mellitus without complications: Secondary | ICD-10-CM | POA: Diagnosis not present

## 2023-12-24 DIAGNOSIS — I825Y9 Chronic embolism and thrombosis of unspecified deep veins of unspecified proximal lower extremity: Secondary | ICD-10-CM | POA: Diagnosis not present

## 2023-12-24 DIAGNOSIS — J449 Chronic obstructive pulmonary disease, unspecified: Secondary | ICD-10-CM

## 2023-12-24 DIAGNOSIS — E1159 Type 2 diabetes mellitus with other circulatory complications: Secondary | ICD-10-CM | POA: Diagnosis not present

## 2023-12-24 DIAGNOSIS — G8929 Other chronic pain: Secondary | ICD-10-CM

## 2023-12-24 DIAGNOSIS — Z794 Long term (current) use of insulin: Secondary | ICD-10-CM

## 2023-12-24 DIAGNOSIS — Z7689 Persons encountering health services in other specified circumstances: Secondary | ICD-10-CM

## 2023-12-24 DIAGNOSIS — Z7984 Long term (current) use of oral hypoglycemic drugs: Secondary | ICD-10-CM

## 2023-12-24 DIAGNOSIS — Z7985 Long-term (current) use of injectable non-insulin antidiabetic drugs: Secondary | ICD-10-CM | POA: Insufficient documentation

## 2023-12-24 LAB — MICROALBUMIN, URINE WAIVED
Creatinine, Urine Waived: 100 mg/dL (ref 10–300)
Microalb, Ur Waived: 80 mg/L — ABNORMAL HIGH (ref 0–19)

## 2023-12-24 MED ORDER — AMOXICILLIN 500 MG PO CAPS: 500.0000 mg | ORAL_CAPSULE | Freq: Two times a day (BID) | ORAL | 0 refills | Status: AC

## 2023-12-24 MED ORDER — ELIQUIS 5 MG PO TABS
5.0000 mg | ORAL_TABLET | Freq: Two times a day (BID) | ORAL | 0 refills | Status: DC
Start: 2023-12-24 — End: 2024-01-11

## 2023-12-24 MED ORDER — METFORMIN HCL 500 MG PO TABS: 1000.0000 mg | ORAL_TABLET | Freq: Two times a day (BID) | ORAL | 2 refills | Status: DC

## 2023-12-24 NOTE — Assessment & Plan Note (Signed)
 Chronic.  Unsure of A1c.  Will obtain labs at visit today.  Restart Metformin.  Discussed GLP1 during visit.  Referral placed for pharmacy. Will follow up next week. To review labs and discuss options for management.  Declined pneumonia shot.

## 2023-12-24 NOTE — Progress Notes (Signed)
 Care Guide Pharmacy Note  12/24/2023 Name: [name] MRN: [id number] DOB: 09-Mar-1981  Referred By: Larae Grooms, NP Reason for referral: Care Coordination (Outreach to schedule with pharm d)   Ricardo Wood is a 43 y.o. year old male who is a primary care patient of Larae Grooms, NP.  DIAMONTE STAVELY was referred to the pharmacist for assistance related to: COPD  Successful contact was made with the patient to discuss pharmacy services including being ready for the pharmacist to call at least 5 minutes before the scheduled appointment time and to have medication bottles and any blood pressure readings ready for review. The patient agreed to meet with the pharmacist via telephone visit on (date/time).12/28/2023  Penne Lash , RMA       [facility], [place] [facility] Guide  Direct Dial: [phone]  Website: [email or web address]

## 2023-12-24 NOTE — Addendum Note (Signed)
 Addended by: Larae Grooms on: 12/24/2023 09:38 AM   Modules accepted: Orders

## 2023-12-24 NOTE — Assessment & Plan Note (Signed)
 Chronic.  Controlled.  Will work on restarting inhaler for maintenance.  However, patient is having financial difficulties.  Referral placed for pharmacy for assistance to help with inhaler cost.  Follow up in 1 week.

## 2023-12-24 NOTE — Assessment & Plan Note (Signed)
 Chronic.  Has not seen Hematology. Not taking Eliquis.  Has had an IVC filter for 12 years.  Restarted Eliquis at visit today.  Referral placed for Hematology.

## 2023-12-27 LAB — COMPREHENSIVE METABOLIC PANEL WITH GFR
ALT: 32 IU/L (ref 0–44)
AST: 20 IU/L (ref 0–40)
Albumin: 4.3 g/dL (ref 4.1–5.1)
Alkaline Phosphatase: 108 IU/L (ref 44–121)
BUN/Creatinine Ratio: 17 (ref 9–20)
BUN: 13 mg/dL (ref 6–24)
Bilirubin Total: 0.3 mg/dL (ref 0.0–1.2)
CO2: 24 mmol/L (ref 20–29)
Calcium: 9.1 mg/dL (ref 8.7–10.2)
Chloride: 99 mmol/L (ref 96–106)
Creatinine, Ser: 0.78 mg/dL (ref 0.76–1.27)
Globulin, Total: 3.1 g/dL (ref 1.5–4.5)
Glucose: 314 mg/dL — ABNORMAL HIGH (ref 70–99)
Potassium: 4 mmol/L (ref 3.5–5.2)
Sodium: 138 mmol/L (ref 134–144)
Total Protein: 7.4 g/dL (ref 6.0–8.5)
eGFR: 116 mL/min/{1.73_m2} (ref >59)

## 2023-12-27 LAB — LIPID PANEL
Chol/HDL Ratio: 4 ratio (ref 0.0–5.0)
Cholesterol, Total: 222 mg/dL — ABNORMAL HIGH (ref 100–199)
HDL: 56 mg/dL (ref >39)
LDL Chol Calc (NIH): 141 mg/dL — ABNORMAL HIGH (ref 0–99)
Triglycerides: 139 mg/dL (ref 0–149)
VLDL Cholesterol Cal: 25 mg/dL (ref 5–40)

## 2023-12-27 LAB — HEMOGLOBIN A1C
Est. average glucose Bld gHb Est-mCnc: 306 mg/dL
Hgb A1c MFr Bld: 12.3 % — ABNORMAL HIGH (ref 4.8–5.6)

## 2023-12-27 NOTE — Progress Notes (Signed)
   12/27/2023  Patient ID: Ricardo Wood, male   DOB: 03-29-1981, 42 y.o.   MRN: [id number]  Clinic routed request from patient's PCP, Larae Grooms, NP, to assist with medication recommendations based on diagnoses and insurance coverage.  Patient is in need of Eliquis for history of multiple VTEs.  Needing maintenance inhaler for COPD, additional diabetes control and likely statin therapy as well as ACE/ARB for BP and renal protection.  Patient has 2 active insurance plans, but Monia Pouch is currently in a "grace period;" because the premium is past due.  This plan is through marketplace, and his other coverage through employer appears to be more beneficial overall.  Pharmacy only has [employer]BS (employer) plan on file, and Eliquis is $40 copay.  This has not been picked up, likely due to cost.  Copay card should take down to $10.  Patient prescribed Symbicort and Spiriva but does not appear to be taking.  Contacted insurance, and these should be covered for $25 copay.  Generic formulations are not covered by the plan at this time.  Patient does not appear to be adherence to metformin regimen based on fill dates.  I recommend GLP1 for additional control as long as no CI to therapy.  Insurance states Ozempic is on formulary but will require PA.  Do not recommend SGLT2 until A1c under better control.  I also recommend addition of moderate-high intensity statin as well as ACE-I or ARB.  Telephone visit is scheduled with patient tomorrow, and I will discuss medication recommendations and affordability.  Patient then sees PCP again Thursday, so I will provider her with recommendations after our conversation.  Lenna Gilford, PharmD, DPLA

## 2023-12-28 ENCOUNTER — Other Ambulatory Visit: Payer: Self-pay

## 2023-12-28 NOTE — Progress Notes (Unsigned)
 12/28/2023 Name: Ricardo Wood MRN: [id number] DOB: 10/24/1980  Chief Complaint  Patient presents with   Medication Assistance   Ricardo Wood is a 43 y.o. year old male who presented for a telephone visit.   They were referred to the pharmacist by their PCP for assistance in managing complex medication management.   Subjective:  Care Team: Primary Care Provider: Larae Grooms, NP ; Next Scheduled Visit: 12/30/23  Medication Access/Adherence  Current Pharmacy:  [id number] - [place], [place] - [address] AT [facility] OF [address] [place] [address] Phone: [phone] Fax: [phone]  -Patient reports affordability concerns with their medications: Yes  -Patient reports access/transportation concerns to their pharmacy: No  -Patient reports adherence concerns with their medications:  Yes    Diabetes: Current medications: metformin 1000mg  BID -Endorses some mild GI upset with metformin -Not currently checking home BG -A1c 12.3% -Patient is not opposed to weekly injectable, and Ozempic is covered by plan with PA  Hypertension: Current medications: None -Does not monitor home BP -BP at OV on 3/28 was 127/91  Hyperlipidemia/ASCVD Risk Reduction Current lipid lowering medications: none -Medications tried in the past: none -Recent LDL 141  COPD: Current medications: none Medications tried in the past: Spiriva and Symbicort were cost prohibitive  Medication Management: -Patient reports fair adherence to medications -Patient reports the following barriers to adherence: affordability -Patient is prescribed Eliquis 5mg  BID for PMH multiple VTE's, but has not had medication in a while.  New prescription sent to pharmacy by PCP, but patient has not picked up yet due to $40 copay. -Patient does endorse 3 days of black stool and bright red blood on toilet paper after bowel movement.  States this has occurred in the past and  resolved spontaneously.  States this has improved since first noticed a few days ago.  Objective: Lab Results  Component Value Date   HGBA1C 12.3 (H) 12/24/2023   Lab Results  Component Value Date   CREATININE 0.78 12/24/2023   BUN 13 12/24/2023   NA 138 12/24/2023   K 4.0 12/24/2023   CL 99 12/24/2023   CO2 24 12/24/2023   Lab Results  Component Value Date   CHOL 222 (H) 12/24/2023   HDL 56 12/24/2023   LDLCALC 141 (H) 12/24/2023   TRIG 139 12/24/2023   CHOLHDL 4.0 12/24/2023   Medications Reviewed Today     Reviewed by Lenna Gilford, RPH (Pharmacist) on 12/28/23 at 1335  Med List Status: <None>   Medication Order Taking? Sig Documenting Provider Last Dose Status Informant  Patient not taking:  Discontinued 12/28/23 1335   amoxicillin (AMOXIL) 500 MG capsule [id number] Yes Take 1 capsule (500 mg total) by mouth 2 (two) times daily for 10 days. Larae Grooms, NP Taking Active   ELIQUIS 5 MG TABS tablet [id number] No Take 1 tablet (5 mg total) by mouth 2 (two) times daily.  Patient not taking: Reported on 12/28/2023   Larae Grooms, NP Not Taking Active   metFORMIN (GLUCOPHAGE) 500 MG tablet [id number] Yes Take 2 tablets (1,000 mg total) by mouth 2 (two) times daily with a meal. Larae Grooms, NP Taking Active     Patient not taking:  Discontinued 12/28/23 1335     Patient not taking:  Discontinued 12/28/23 1335            Assessment/Plan:   Diabetes: -Currently uncontrolled -Reviewed long term cardiovascular and renal outcomes of uncontrolled  blood sugar -Reviewed goal A1c, goal fasting, and goal 2 hour post prandial glucose -Recommend initiation of Ozempic 0.25mg  weekly x4 weeks, increasing to 0.5mg  weekly if tolerating well- submitting PA, and it has been approved by insurance.  With copay card, patient should pay $25. -Recommend changing metformin to XR formulation to help with GI upset -Patient denies personal or family history of multiple endocrine  neoplasia type 2, medullary thyroid cancer; personal history of pancreatitis or gallbladder disease. -Recommend patient begin to check glucose at least 3x/week as we adjust medications -Recommend f/u A1c in 3 months- if 7-9% and no ketones present in urine, I recommend initiation of SGLT2  Hypertension: -Currently moderately controlled -Recommend to initiate ACE or ARB therapy for BP benefit and renal protection in patient with diabetes   Hyperlipidemia/ASCVD Risk Reduction: -Currently uncontrolled.  -Reviewed long term complications of uncontrolled cholesterol -Recommend to initiate rosuvastatin 10mg  daily with follow-up LFT and lipid panel in 3 months  COPD: -Insurance covers Pena, which could replace previously prescribed Symbicort + Spiriva.  With manufacturer copay card, patient could be able to get for $0.  If prescribed, I will obtain copay card and provide to pharmacy.  Medication Management: -Currently strategy insufficient to maintain appropriate adherence to prescribed medication regimen -Obtaining manufacturer copay card for Eliquis and providing to pharmacy.  This should make copay $10 moving forward.  Patient will not be able to pick up until Thursday after visit with PCP, which is advisable based on current rectal bleeding.  Follow Up Plan: Providing recommendations to PCP before patient's f/u 4/3- will review visit notes/medication changes and contact patient to schedule follow-up appropriately  Lenna Gilford, PharmD, DPLA

## 2023-12-29 DIAGNOSIS — M7541 Impingement syndrome of right shoulder: Secondary | ICD-10-CM | POA: Diagnosis not present

## 2023-12-30 ENCOUNTER — Ambulatory Visit (INDEPENDENT_AMBULATORY_CARE_PROVIDER_SITE_OTHER): Admitting: Nurse Practitioner

## 2023-12-30 ENCOUNTER — Encounter: Payer: Self-pay | Admitting: Nurse Practitioner

## 2023-12-30 ENCOUNTER — Telehealth: Payer: Self-pay

## 2023-12-30 VITALS — BP 128/82 | HR 83 | Temp 97.7°F | Wt 314.0 lb

## 2023-12-30 DIAGNOSIS — E1169 Type 2 diabetes mellitus with other specified complication: Secondary | ICD-10-CM

## 2023-12-30 DIAGNOSIS — E119 Type 2 diabetes mellitus without complications: Secondary | ICD-10-CM | POA: Diagnosis not present

## 2023-12-30 DIAGNOSIS — Z139 Encounter for screening, unspecified: Secondary | ICD-10-CM

## 2023-12-30 DIAGNOSIS — J449 Chronic obstructive pulmonary disease, unspecified: Secondary | ICD-10-CM

## 2023-12-30 DIAGNOSIS — L853 Xerosis cutis: Secondary | ICD-10-CM

## 2023-12-30 DIAGNOSIS — Z7985 Long-term (current) use of injectable non-insulin antidiabetic drugs: Secondary | ICD-10-CM

## 2023-12-30 DIAGNOSIS — R197 Diarrhea, unspecified: Secondary | ICD-10-CM | POA: Diagnosis not present

## 2023-12-30 MED ORDER — OLMESARTAN MEDOXOMIL 20 MG PO TABS: 20.0000 mg | ORAL_TABLET | Freq: Every day | ORAL | 0 refills | Status: DC

## 2023-12-30 MED ORDER — OZEMPIC (0.25 OR 0.5 MG/DOSE) 2 MG/1.5ML ~~LOC~~ SOPN: 0.2500 mg | PEN_INJECTOR | SUBCUTANEOUS | 0 refills | Status: DC

## 2023-12-30 MED ORDER — ROSUVASTATIN CALCIUM 20 MG PO TABS: 20.0000 mg | ORAL_TABLET | Freq: Every day | ORAL | 0 refills | Status: DC

## 2023-12-30 MED ORDER — METFORMIN HCL ER 500 MG PO TB24: 1000.0000 mg | ORAL_TABLET | Freq: Two times a day (BID) | ORAL | 0 refills | Status: DC

## 2023-12-30 MED ORDER — BREZTRI AEROSPHERE 160-9-4.8 MCG/ACT IN AERO: 2.0000 | INHALATION_SPRAY | Freq: Two times a day (BID) | RESPIRATORY_TRACT | 0 refills | Status: DC

## 2023-12-30 NOTE — Progress Notes (Signed)
 BP 128/82   Pulse 83   Temp 97.7 F (36.5 C) (Oral)   Wt (!) 314 lb (142.4 kg)   SpO2 98%   BMI 43.43 kg/m    Subjective:    Patient ID: Ricardo Wood, male    DOB: May 09, 1981, 43 y.o.   MRN: [id number]  HPI: Ricardo Wood is a 43 y.o. male  Chief Complaint  Patient presents with   Pain    Pt states he has been having pain in his waist area and when wipes after using the bathroom he wipes    DIABETES Patient's A1c came back last week as 12.4%.  Has only been taking metformin.  Hypoglycemic episodes:no Polydipsia/polyuria: no Visual disturbance: no Chest pain: no Paresthesias: no Glucose Monitoring: no  Accucheck frequency: Not Checking  Fasting glucose:  Post prandial:  Evening:  Before meals: Taking Insulin?: no  Long acting insulin:  Short acting insulin: Blood Pressure Monitoring: not checking Retinal Examination: Not up to Date Foot Exam: Not up to Date Diabetic Education: Not Completed Pneumovax: Not up to Date Influenza: Not up to Date Aspirin: no  COPD COPD status: stable Satisfied with current treatment?: no Oxygen use: yes Dyspnea frequency:  Cough frequency:  Rescue inhaler frequency:   Limitation of activity: no Productive cough:  Last Spirometry:  Pneumovax: Not up to Date Influenza: Not up to Date  Patient states around his waist area he has cramping a lot.  He is having small circles of blood on the toilet paper when he wipes.  Happening for the past 3 days.  States he hasn't had any today.  Pain is 6/10.  Patient is having diarrhea.  States the consistency is like rice pudding.  Having about 3 per day.  He has always had frequent bowel movement.    Relevant past medical, surgical, family and social history reviewed and updated as indicated. Interim medical history since our last visit reviewed. Allergies and medications reviewed and updated.  Review of Systems  Eyes:  Negative for visual disturbance.  Respiratory:  Negative for  chest tightness and shortness of breath.   Cardiovascular:  Negative for chest pain, palpitations and leg swelling.  Gastrointestinal:  Positive for abdominal pain and diarrhea.  Endocrine: Negative for polydipsia and polyuria.  Skin:        Dry skin  Neurological:  Negative for dizziness, light-headedness, numbness and headaches.    Per HPI unless specifically indicated above     Objective:    BP 128/82   Pulse 83   Temp 97.7 F (36.5 C) (Oral)   Wt (!) 314 lb (142.4 kg)   SpO2 98%   BMI 43.43 kg/m   Wt Readings from Last 3 Encounters:  12/30/23 (!) 314 lb (142.4 kg)  12/24/23 (!) 311 lb 9.6 oz (141.3 kg)  09/30/23 300 lb (136.1 kg)    Physical Exam Vitals and nursing note reviewed.  Constitutional:      General: He is not in acute distress.    Appearance: Normal appearance. He is not ill-appearing, toxic-appearing or diaphoretic.  HENT:     Head: Normocephalic.     Right Ear: External ear normal.     Left Ear: External ear normal.     Nose: Nose normal. No congestion or rhinorrhea.     Mouth/Throat:     Mouth: Mucous membranes are moist.  Eyes:     General:        Right eye: No discharge.  Left eye: No discharge.     Extraocular Movements: Extraocular movements intact.     Conjunctiva/sclera: Conjunctivae normal.     Pupils: Pupils are equal, round, and reactive to light.  Cardiovascular:     Rate and Rhythm: Normal rate and regular rhythm.     Heart sounds: No murmur heard. Pulmonary:     Effort: Pulmonary effort is normal. No respiratory distress.     Breath sounds: Normal breath sounds. No wheezing, rhonchi or rales.  Abdominal:     General: Abdomen is flat. Bowel sounds are normal. There is no distension.     Palpations: Abdomen is soft. There is no mass.     Tenderness: There is no abdominal tenderness. There is no right CVA tenderness, left CVA tenderness, guarding or rebound.     Hernia: No hernia is present.  Musculoskeletal:     Cervical  back: Normal range of motion and neck supple.  Skin:    General: Skin is warm and dry.     Capillary Refill: Capillary refill takes less than 2 seconds.  Neurological:     General: No focal deficit present.     Mental Status: He is alert and oriented to person, place, and time.  Psychiatric:        Mood and Affect: Mood normal.        Behavior: Behavior normal.        Thought Content: Thought content normal.        Judgment: Judgment normal.     Results for orders placed or performed in visit on 12/24/23  Comp Met (CMET)   Collection Time: 12/24/23  9:38 AM  Result Value Ref Range   Glucose 314 (H) 70 - 99 mg/dL   BUN 13 6 - 24 mg/dL   Creatinine, Ser 1.61 0.76 - 1.27 mg/dL   eGFR 096 >04 VW/UJW/1.19   BUN/Creatinine Ratio 17 9 - 20   Sodium 138 134 - 144 mmol/L   Potassium 4.0 3.5 - 5.2 mmol/L   Chloride 99 96 - 106 mmol/L   CO2 24 20 - 29 mmol/L   Calcium 9.1 8.7 - 10.2 mg/dL   Total Protein 7.4 6.0 - 8.5 g/dL   Albumin 4.3 4.1 - 5.1 g/dL   Globulin, Total 3.1 1.5 - 4.5 g/dL   Bilirubin Total 0.3 0.0 - 1.2 mg/dL   Alkaline Phosphatase 108 44 - 121 IU/L   AST 20 0 - 40 IU/L   ALT 32 0 - 44 IU/L  Lipid Profile   Collection Time: 12/24/23  9:38 AM  Result Value Ref Range   Cholesterol, Total 222 (H) 100 - 199 mg/dL   Triglycerides 147 0 - 149 mg/dL   HDL 56 >82 mg/dL   VLDL Cholesterol Cal 25 5 - 40 mg/dL   LDL Chol Calc (NIH) 956 (H) 0 - 99 mg/dL   Chol/HDL Ratio 4.0 0.0 - 5.0 ratio  HgB A1c   Collection Time: 12/24/23  9:38 AM  Result Value Ref Range   Hgb A1c MFr Bld 12.3 (H) 4.8 - 5.6 %   Est. average glucose Bld gHb Est-mCnc 306 mg/dL  Microalbumin, Urine Waived   Collection Time: 12/24/23  9:38 AM  Result Value Ref Range   Microalb, Ur Waived 80 (H) 0 - 19 mg/L   Creatinine, Urine Waived 100 10 - 300 mg/dL   Microalb/Creat Ratio 30-300 (H) <30 mg/g      Assessment & Plan:   Problem List Items Addressed This Visit  Respiratory   COPD (chronic  obstructive pulmonary disease) (HCC)   Chronic.  Will start Breztri.  Side effects and benefits discussed.  Make sure to rinse mouth out after use.  Follow up in 1 month.       Relevant Medications   budeson-glycopyrrolate-formoterol (BREZTRI AEROSPHERE) 160-9-4.8 MCG/ACT AERO     Endocrine   Diabetes mellitus treated with injections of non-insulin medication (HCC) - Primary   Chronic.  Not well controlled.  A1c last week was 12.4%.  Will change Metformin to XR due to GI symptoms.  Will start Ozempic once weekly.  Discussed side effects and benefits of medication.  Discussed how to inject medication.  Follow up in 1 month.  Olmesartan and Rosuvastatin started per guidelines.  Declines pneumonia shot.       Relevant Medications   olmesartan (BENICAR) 20 MG tablet   rosuvastatin (CRESTOR) 20 MG tablet   Semaglutide,0.25 or 0.5MG /DOS, (OZEMPIC, 0.25 OR 0.5 MG/DOSE,) 2 MG/1.5ML SOPN   metFORMIN (GLUCOPHAGE-XR) 500 MG 24 hr tablet   Other Visit Diagnoses       Diarrhea, unspecified type       Ongoing issue for several years.  Referral placed for GI.  Metformin changed to XR.   Relevant Orders   Ambulatory referral to Gastroenterology     Encounter for screening involving social determinants of health [facility])       Patient struggling with bills.  Will be difficult for him to afford his medication.  Referral placed for social worker.   Relevant Orders   AMB Referral [facility] Care Management     Dry skin dermatitis       Referral placed for patient to see Dermatology.   Relevant Orders   Ambulatory referral to Dermatology        Follow up plan: Return in about 1 month (around 01/29/2024) for HTN, HLD, DM2 FU.

## 2023-12-30 NOTE — Assessment & Plan Note (Signed)
 Chronic.  Not well controlled.  A1c last week was 12.4%.  Will change Metformin to XR due to GI symptoms.  Will start Ozempic once weekly.  Discussed side effects and benefits of medication.  Discussed how to inject medication.  Follow up in 1 month.  Olmesartan and Rosuvastatin started per guidelines.  Declines pneumonia shot.

## 2023-12-30 NOTE — Assessment & Plan Note (Signed)
 Chronic.  Will start Breztri.  Side effects and benefits discussed.  Make sure to rinse mouth out after use.  Follow up in 1 month.

## 2024-01-03 NOTE — Progress Notes (Signed)
   01/03/2024  Patient ID: Ricardo Wood, male   DOB: April 17, 1981, 43 y.o.   MRN: [id number]  Subjective/Objective  Medication Adherence/Affordability -Contacting patient to schedule telephone visit to follow-up in another 2-4 weeks.  Recently seen by PCP and the following medication changes were made:  Marlaine Hind- patient has not yet picked up due to cost Metformin IR changed to XR to help with GI upset- not yet picked up Initiated Ozempic 0.25/0.5mg - not yet picked up due to cost Initiated rosuvastatin 20mg  daily- not yet picked up Initiated olmesartan 20mg  daily- not yet picked up  -Patient also has not yet picked up Eliquis due to copay.  I have provided pharmacy with manufacturer copay card that will take this down to $10.  Card has been activated, and just needs to be processed secondary to his insurance. -Patient states he is no longer having rectal bleeding, but abdominal pain worsened over the weekend, specifically overnight last night.  No n/v or fever, just pain and diarrhea.  Referral to GI placed by PCP Friday.  Assessment/Plan  Medication Adherence/Affordability -Obtained manufacturer copay cards for Ozempic (will make $25) and Markus Daft (will make $0).  Attempted to call pharmacy to provide information but never could get someone on the phone.  Emailing patient card information to provide to pharmacy. -Instructed patient to have pharmacy also process copay card for Eliquis (will make $10) now that this has been activated -Other new medications are generics and will hopefully be affordable -Referral for SW placed by PCP to assist with financial strains -Informed patient to go to urgent care/ED if symptoms worsen or do not improve in regard to abdominal pain, as it is unlikely he will be able to see GI for several weeks  Follow-up:  2 weeks but instructed patient to reach out if he is unable to get medications  Lenna Gilford, PharmD, DPLA

## 2024-01-04 ENCOUNTER — Inpatient Hospital Stay: Attending: Oncology | Admitting: Oncology

## 2024-01-04 ENCOUNTER — Inpatient Hospital Stay

## 2024-01-04 ENCOUNTER — Encounter: Payer: Self-pay | Admitting: Oncology

## 2024-01-04 VITALS — BP 96/72 | HR 78 | Temp 97.0°F | Resp 18 | Ht 72.0 in | Wt 309.8 lb

## 2024-01-04 DIAGNOSIS — I82501 Chronic embolism and thrombosis of unspecified deep veins of right lower extremity: Secondary | ICD-10-CM | POA: Diagnosis not present

## 2024-01-04 DIAGNOSIS — Z87891 Personal history of nicotine dependence: Secondary | ICD-10-CM | POA: Diagnosis not present

## 2024-01-04 LAB — ANTITHROMBIN III: AntiThromb III Func: 101 % (ref 75–120)

## 2024-01-04 NOTE — Progress Notes (Addendum)
 Hematology/Oncology Consult note [facility] Telephone:([phone] [phone]  Patient Care Team: Larae Grooms, NP as PCP - General (Nurse Practitioner) Creig Hines, MD as Consulting Physician (Hematology and Oncology)   Name of the patient: Ricardo Wood  [id number]  20-Oct-1980    Reason for referral-history of recurrent DVT   Referring physician-Karen Caren Griffins, NP  Date of visit: 01/04/24   History of presenting illness-patient is a 43 year old male who has been referred for history of recurrent DVT.  He states his first episode of right lower extremity DVT was 12 years ago and at that time he was briefly on Coumadin for about 6 months and he stopped it.  At that time he had an IVC filter placed for unclear reasons and that has not yet been retrieved.  A year later he had left lower extremity DVT and he again took anticoagulation for a few months and stopped it.  4 years later he had another episode of right lower extremity DVT for which she went back on Lovenox followed by Coumadin.His fourth episode of DVT was in September 2021 which showed occlusive DVT from the right distal femoral vein through popliteal vein and into the right calf.  He took Eliquis briefly and stopped it.  Presently he is not on any anticoagulation.  These episodes of DVT have been unprovoked.  He is a non-smoker but does have a BMI of 42.  He is willing to consider restarting anticoagulation if need be.  He has not had DVT or PE since September 2021.  He does have on and off lower extremity swelling  ECOG PS- 1  Pain scale- 0   Review of systems- Review of Systems  Constitutional:  Negative for chills, fever, malaise/fatigue and weight loss.  HENT:  Negative for congestion, ear discharge and nosebleeds.   Eyes:  Negative for blurred vision.  Respiratory:  Negative for cough, hemoptysis, sputum production, shortness of breath and wheezing.   Cardiovascular:   Negative for chest pain, palpitations, orthopnea and claudication.  Gastrointestinal:  Negative for abdominal pain, blood in stool, constipation, diarrhea, heartburn, melena, nausea and vomiting.  Genitourinary:  Negative for dysuria, flank pain, frequency, hematuria and urgency.  Musculoskeletal:  Negative for back pain, joint pain and myalgias.  Skin:  Negative for rash.  Neurological:  Negative for dizziness, tingling, focal weakness, seizures, weakness and headaches.  Endo/Heme/Allergies:  Does not bruise/bleed easily.  Psychiatric/Behavioral:  Negative for depression and suicidal ideas. The patient does not have insomnia.     Allergies  Allergen Reactions   Onion    Other Rash    Allergic to adhesive on fabric bandages    Patient Active Problem List   Diagnosis Date Noted   Diabetes mellitus treated with insulin and oral medication (HCC) 12/24/2023   Diabetes mellitus treated with injections of non-insulin medication (HCC) 12/24/2023   Lymphedema 12/11/2020   COPD (chronic obstructive pulmonary disease) (HCC) 12/11/2020   Chronic deep vein thrombosis (DVT) (HCC) 10/14/2020   Chest pain 07/25/2019     Past Medical History:  Diagnosis Date   Bipolar 1 disorder (HCC)    Diabetes mellitus without complication (HCC)    DVT (deep venous thrombosis) (HCC)    Kidney stones    Schizophrenia (HCC)    Seizures (HCC)      Past Surgical History:  Procedure Laterality Date   dvt filter      Social History   Socioeconomic History   Marital status: Married  Spouse name: Not on file   Number of children: Not on file   Years of education: Not on file   Highest education level: Not on file  Occupational History   Not on file  Tobacco Use   Smoking status: Former   Smokeless tobacco: Never  Vaping Use   Vaping status: Never Used  Substance and Sexual Activity   Alcohol use: Yes    Comment: socially   Drug use: Not Currently    Types: Marijuana    Comment: last  smoked about a week ago   Sexual activity: Yes  Other Topics Concern   Not on file  Social History Narrative   Not on file   Social Drivers of Health   Financial Resource Strain: High Risk (12/24/2023)   Overall Financial Resource Strain (CARDIA)    Difficulty of Paying Living Expenses: Hard  Food Insecurity: Food Insecurity Present (12/24/2023)   Hunger Vital Sign    Worried About Running Out of Food in the Last Year: Sometimes true    Ran Out of Food in the Last Year: Sometimes true  Transportation Needs: No Transportation Needs (12/24/2023)   PRAPARE - Administrator, Civil Service (Medical): No    Lack of Transportation (Non-Medical): No  Physical Activity: Sufficiently Active (12/24/2023)   Exercise Vital Sign    Days of Exercise per Week: 6 days    Minutes of Exercise per Session: 60 min  Stress: Not on file  Social Connections: Socially Isolated (12/24/2023)   Social Connection and Isolation Panel [NHANES]    Frequency of Communication with Friends and Family: Once a week    Frequency of Social Gatherings with Friends and Family: Never    Attends Religious Services: Never    Database administrator or Organizations: No    Attends Banker Meetings: Never    Marital Status: Married  Catering manager Violence: Not At Risk (12/24/2023)   Humiliation, Afraid, Rape, and Kick questionnaire    Fear of Current or Ex-Partner: No    Emotionally Abused: No    Physically Abused: No    Sexually Abused: No     Family History  Problem Relation Age of Onset   Mental illness Mother    Aneurysm Mother    Skin cancer Mother    Cancer Maternal Aunt    Cancer Maternal Uncle      Current Outpatient Medications:    ELIQUIS 5 MG TABS tablet, Take 1 tablet (5 mg total) by mouth 2 (two) times daily., Disp: 60 tablet, Rfl: 0   budeson-glycopyrrolate-formoterol (BREZTRI AEROSPHERE) 160-9-4.8 MCG/ACT AERO, Inhale 2 puffs into the lungs 2 (two) times daily. (Patient not  taking: Reported on 01/03/2024), Disp: 10.7 g, Rfl: 0   metFORMIN (GLUCOPHAGE-XR) 500 MG 24 hr tablet, Take 2 tablets (1,000 mg total) by mouth 2 (two) times daily with a meal. (Patient not taking: Reported on 01/03/2024), Disp: 180 tablet, Rfl: 0   methocarbamol (ROBAXIN) 500 MG tablet, Take by mouth. (Patient not taking: Reported on 01/04/2024), Disp: , Rfl:    olmesartan (BENICAR) 20 MG tablet, Take 1 tablet (20 mg total) by mouth daily. (Patient not taking: Reported on 01/03/2024), Disp: 90 tablet, Rfl: 0   rosuvastatin (CRESTOR) 20 MG tablet, Take 1 tablet (20 mg total) by mouth daily. (Patient not taking: Reported on 01/03/2024), Disp: 90 tablet, Rfl: 0   Semaglutide,0.25 or 0.5MG /DOS, (OZEMPIC, 0.25 OR 0.5 MG/DOSE,) 2 MG/1.5ML SOPN, Inject 0.25 mg into the skin once a  week. Start with 0.25MG  once a week x 4 weeks, then increase to 0.5MG  weekly. (Patient not taking: Reported on 01/03/2024), Disp: 6 mL, Rfl: 0   Physical exam:  Vitals:   01/04/24 1105  BP: 96/72  Pulse: 78  Resp: 18  Temp: (!) 97 F (36.1 C)  TempSrc: Tympanic  SpO2: 97%  Weight: (!) 309 lb 12.8 oz (140.5 kg)  Height: 6' (1.829 m)   Physical Exam Cardiovascular:     Rate and Rhythm: Normal rate and regular rhythm.     Heart sounds: Normal heart sounds.  Pulmonary:     Effort: Pulmonary effort is normal.     Breath sounds: Normal breath sounds.  Musculoskeletal:     Comments: Trace pedal edema bilaterally and evidence of small varicosities over bilateral feet  Skin:    General: Skin is warm and dry.  Neurological:     Mental Status: He is alert and oriented to person, place, and time.           Latest Ref Rng & Units 12/24/2023    9:38 AM  CMP  Glucose 70 - 99 mg/dL 010   BUN 6 - 24 mg/dL 13   Creatinine 2.72 - 1.27 mg/dL 5.36   Sodium 644 - 034 mmol/L 138   Potassium 3.5 - 5.2 mmol/L 4.0   Chloride 96 - 106 mmol/L 99   CO2 20 - 29 mmol/L 24   Calcium 8.7 - 10.2 mg/dL 9.1   Total Protein 6.0 - 8.5 g/dL 7.4    Total Bilirubin 0.0 - 1.2 mg/dL 0.3   Alkaline Phos 44 - 121 IU/L 108   AST 0 - 40 IU/L 20   ALT 0 - 44 IU/L 32       Latest Ref Rng & Units 09/08/2023    1:49 PM  CBC  WBC 4.0 - 10.5 K/uL 6.0   Hemoglobin 13.0 - 17.0 g/dL 74.2   Hematocrit 59.5 - 52.0 % 44.9   Platelets 150 - 400 K/uL 141    Assessment and plan- Patient is a 43 y.o. male with history of recurrent DVT presently not on anticoagulation referred for further management.  Based on patient history he seems to have had at least 4 different episodes of DVT over the last 12 years and he has taken anticoagulation briefly with every episode and subsequently stopped it.  He has not been on any anticoagulation since 2021 and has not had any DVT since then.  Discussed with the patient that male sex, obesity as well as recurrent DVT or factors to be considered and considering lifelong anticoagulation.  We will obtain hypercoagulable workup at this time although it may not necessarily change management unless he has antiphospholipid antibody syndrome as recurrent DVT by itself would be an indication for considering indefinite anticoagulation.  Having said that patient has gone through 3-1/2 years now without having episodes of thrombosis despite not restarting anticoagulation.  I will see him back in 2 weeks time to discuss results of hypercoagulable workup and we could consider restarting him on Eliquis 2.5 mg twice daily indefinitely given his risk factors as mentioned above.  Patient also had IVC filter placed about 12 years ago which has subsequently not been retrieved. I will reach out to vascular surgery if they can take a look at his CT abdomen and see if retrieving the filter presently is a possibility.   Thank you for this kind referral and the opportunity to participate in the care of this  patient   Visit Diagnosis 1. Chronic deep vein thrombosis (DVT) of right lower extremity, unspecified vein (HCC)     Dr. Owens Shark, MD,  [facility] [phone] 01/04/2024

## 2024-01-05 ENCOUNTER — Telehealth: Payer: Self-pay

## 2024-01-05 ENCOUNTER — Emergency Department

## 2024-01-05 ENCOUNTER — Emergency Department
Admission: EM | Admit: 2024-01-05 | Discharge: 2024-01-05 | Disposition: A | Discharge status: Home / Self Care | Attending: Emergency Medicine | Admitting: Emergency Medicine

## 2024-01-05 DIAGNOSIS — M545 Low back pain, unspecified: Secondary | ICD-10-CM

## 2024-01-05 DIAGNOSIS — E1165 Type 2 diabetes mellitus with hyperglycemia: Secondary | ICD-10-CM | POA: Diagnosis not present

## 2024-01-05 DIAGNOSIS — Z7901 Long term (current) use of anticoagulants: Secondary | ICD-10-CM | POA: Diagnosis not present

## 2024-01-05 DIAGNOSIS — R160 Hepatomegaly, not elsewhere classified: Secondary | ICD-10-CM | POA: Diagnosis not present

## 2024-01-05 DIAGNOSIS — M5442 Lumbago with sciatica, left side: Secondary | ICD-10-CM | POA: Diagnosis not present

## 2024-01-05 DIAGNOSIS — K760 Fatty (change of) liver, not elsewhere classified: Secondary | ICD-10-CM | POA: Diagnosis not present

## 2024-01-05 DIAGNOSIS — K700 Alcoholic fatty liver: Secondary | ICD-10-CM | POA: Insufficient documentation

## 2024-01-05 DIAGNOSIS — R739 Hyperglycemia, unspecified: Secondary | ICD-10-CM

## 2024-01-05 DIAGNOSIS — N200 Calculus of kidney: Secondary | ICD-10-CM | POA: Insufficient documentation

## 2024-01-05 DIAGNOSIS — R109 Unspecified abdominal pain: Secondary | ICD-10-CM | POA: Diagnosis not present

## 2024-01-05 LAB — LUPUS ANTICOAGULANT
DRVVT: 40.3 s (ref 0.0–47.0)
PTT Lupus Anticoagulant: 27 s (ref 0.0–43.5)
Thrombin Time: 20.6 s (ref 0.0–23.0)
dPT Confirm Ratio: 1.32 ratio (ref 0.00–1.34)
dPT: 37.1 s (ref 0.0–47.6)

## 2024-01-05 LAB — BETA-2-GLYCOPROTEIN I ABS, IGG/M/A
Beta-2 Glyco I IgG: <9 GPI IgG units (ref 0–20)
Beta-2-Glycoprotein I IgA: <9 GPI IgA units (ref 0–25)
Beta-2-Glycoprotein I IgM: <9 GPI IgM units (ref 0–32)

## 2024-01-05 LAB — URINALYSIS, ROUTINE W REFLEX MICROSCOPIC
Bacteria, UA: NONE SEEN
Bilirubin Urine: NEGATIVE
Glucose, UA: >=500 mg/dL — AB
Hgb urine dipstick: NEGATIVE
Ketones, ur: NEGATIVE mg/dL
Leukocytes,Ua: NEGATIVE
Nitrite: NEGATIVE
Protein, ur: NEGATIVE mg/dL
RBC / HPF: 0 RBC/hpf (ref 0–5)
Specific Gravity, Urine: 1.025 (ref 1.005–1.030)
Squamous Epithelial / HPF: 0 /HPF (ref 0–5)
pH: 5 (ref 5.0–8.0)

## 2024-01-05 LAB — CBC WITH DIFFERENTIAL/PLATELET
Abs Immature Granulocytes: 0.04 10*3/uL (ref 0.00–0.07)
Basophils Absolute: 0.1 10*3/uL (ref 0.0–0.1)
Basophils Relative: 1 %
Eosinophils Absolute: 0.2 10*3/uL (ref 0.0–0.5)
Eosinophils Relative: 2 %
HCT: 45.4 % (ref 39.0–52.0)
Hemoglobin: 15.2 g/dL (ref 13.0–17.0)
Immature Granulocytes: 0 %
Lymphocytes Relative: 43 %
Lymphs Abs: 4.1 10*3/uL — ABNORMAL HIGH (ref 0.7–4.0)
MCH: 30.3 pg (ref 26.0–34.0)
MCHC: 33.5 g/dL (ref 30.0–36.0)
MCV: 90.4 fL (ref 80.0–100.0)
Monocytes Absolute: 0.5 10*3/uL (ref 0.1–1.0)
Monocytes Relative: 6 %
Neutro Abs: 4.4 10*3/uL (ref 1.7–7.7)
Neutrophils Relative %: 48 %
Platelets: 173 10*3/uL (ref 150–400)
RBC: 5.02 MIL/uL (ref 4.22–5.81)
RDW: 12.2 % (ref 11.5–15.5)
Smear Review: NORMAL
WBC: 9.3 10*3/uL (ref 4.0–10.5)
nRBC: 0 % (ref 0.0–0.2)

## 2024-01-05 LAB — RESP PANEL BY RT-PCR (RSV, FLU A&B, COVID)  RVPGX2
Influenza A by PCR: NEGATIVE
Influenza B by PCR: NEGATIVE
Resp Syncytial Virus by PCR: NEGATIVE
SARS Coronavirus 2 by RT PCR: NEGATIVE

## 2024-01-05 LAB — COMPREHENSIVE METABOLIC PANEL WITH GFR
ALT: 39 U/L (ref 0–44)
AST: 27 U/L (ref 15–41)
Albumin: 3.7 g/dL (ref 3.5–5.0)
Alkaline Phosphatase: 74 U/L (ref 38–126)
Anion gap: 9 (ref 5–15)
BUN: 13 mg/dL (ref 6–20)
CO2: 26 mmol/L (ref 22–32)
Calcium: 9 mg/dL (ref 8.9–10.3)
Chloride: 93 mmol/L — ABNORMAL LOW (ref 98–111)
Creatinine, Ser: 0.85 mg/dL (ref 0.61–1.24)
GFR, Estimated: >60 mL/min (ref >60)
Glucose, Bld: 549 mg/dL (ref 70–99)
Potassium: 4.3 mmol/L (ref 3.5–5.1)
Sodium: 128 mmol/L — ABNORMAL LOW (ref 135–145)
Total Bilirubin: 0.6 mg/dL (ref 0.0–1.2)
Total Protein: 7 g/dL (ref 6.5–8.1)

## 2024-01-05 LAB — CBG MONITORING, ED
Glucose-Capillary: 474 mg/dL — ABNORMAL HIGH (ref 70–99)
Glucose-Capillary: 352 mg/dL — ABNORMAL HIGH (ref 70–99)

## 2024-01-05 LAB — PROTEIN S PANEL
Protein S Activity: 92 % (ref 63–140)
Protein S Ag, Free: 119 % (ref 61–136)
Protein S Ag, Total: 95 % (ref 60–150)

## 2024-01-05 LAB — HEXAGONAL PHASE PHOSPHOLIPID: Hex Phosph Neut Test: 3 s (ref 0–11)

## 2024-01-05 LAB — PROTEIN C, TOTAL: Protein C, Total: 103 % (ref 60–150)

## 2024-01-05 LAB — HEX PHASE PHOSPHOLIPID REFLEX

## 2024-01-05 MED ORDER — INSULIN ASPART 100 UNIT/ML IJ SOLN
6.0000 [IU] | Freq: Once | INTRAMUSCULAR | Status: AC
  Administered 2024-01-05: 6 [IU] via SUBCUTANEOUS
  Filled 2024-01-05: qty 1

## 2024-01-05 MED ORDER — INSULIN ASPART 100 UNIT/ML IJ SOLN
8.0000 [IU] | Freq: Once | INTRAMUSCULAR | Status: AC
  Administered 2024-01-05: 8 [IU] via SUBCUTANEOUS
  Filled 2024-01-05: qty 1

## 2024-01-05 MED ORDER — LANCETS MISC. MISC
1.0000 | Freq: Three times a day (TID) | 0 refills | Status: DC
Start: 2024-01-05 — End: 2024-01-19

## 2024-01-05 MED ORDER — LACTATED RINGERS IV BOLUS
1000.0000 mL | Freq: Once | INTRAVENOUS | Status: AC
  Administered 2024-01-05: 1000 mL via INTRAVENOUS

## 2024-01-05 MED ORDER — BLOOD GLUCOSE MONITORING SUPPL DEVI: 1.0000 | Freq: Three times a day (TID) | 0 refills | Status: DC

## 2024-01-05 MED ORDER — LANCET DEVICE MISC: 1.0000 | Freq: Three times a day (TID) | 0 refills | Status: AC

## 2024-01-05 MED ORDER — BLOOD GLUCOSE TEST VI STRP: 1.0000 | ORAL_STRIP | Freq: Three times a day (TID) | 0 refills | Status: DC

## 2024-01-05 MED ORDER — KETOROLAC TROMETHAMINE 15 MG/ML IJ SOLN
15.0000 mg | Freq: Once | INTRAMUSCULAR | Status: AC
  Administered 2024-01-05: 15 mg via INTRAVENOUS
  Filled 2024-01-05: qty 1

## 2024-01-05 NOTE — ED Provider Triage Note (Signed)
 Emergency Medicine Provider Triage Evaluation Note  Ricardo Wood , a 43 y.o. male  was evaluated in triage.  Pt complains of left lower flank pain that radiates to the back.  Patient denies hematuria.  Patient has history of kidney stones.  Review of Systems  Positive:  Negative:   Physical Exam  BP (!) 149/95   Pulse 86   Temp 98.4 F (36.9 C) (Oral)   Resp 19   Wt (!) 140.2 kg   SpO2 97%   BMI 41.91 kg/m  Gen:   Awake, no distress   Resp:  Normal effort  MSK:   Moves extremities without difficulty Other:    Medical Decision Making  Medically screening exam initiated at 6:29 PM.  Appropriate orders placed.  KEONTAY VORA was informed that the remainder of the evaluation will be completed by another provider, this initial triage assessment does not replace that evaluation, and the importance of remaining in the ED until their evaluation is complete.  Patient with left lower flank abdominal pain possible urolithiasis order CBC CMP UA and CT   Gladys Damme, PA-C 01/05/24 1829

## 2024-01-05 NOTE — ED Triage Notes (Signed)
 Pt c/o L flank and lower back x2 days. Pt reports pain worsened overnight. Pt has hx of renal stones. Pt denies pain or blood in urine but endorses urinary frequency.

## 2024-01-05 NOTE — Progress Notes (Signed)
 Complex Care Management Note  Care Guide Note 01/05/2024 Name: Ricardo Wood MRN: [id number] DOB: 22-Jan-1981  Ricardo Wood is a 43 y.o. year old male who sees Larae Grooms, NP for primary care. I reached out to Charise Killian by phone today to offer complex care management services.  [name] was given information about Complex Care Management services today including:   The Complex Care Management services include support from the care team which includes your Nurse Care Manager, Clinical Social Worker, or Pharmacist.  The Complex Care Management team is here to help remove barriers to the health concerns and goals most important to you. Complex Care Management services are voluntary, and the patient may decline or stop services at any time by request to their care team member.   Complex Care Management Consent Status: Patient agreed to services and verbal consent obtained.   Follow up plan:  Telephone appointment with complex care management team member scheduled for:  01/18/2024  Encounter Outcome:  Patient Scheduled  Penne Lash , RMA     [place]  [facility] Guide  Direct Dial: [phone]  Website: [email or web address]

## 2024-01-05 NOTE — ED Provider Notes (Signed)
 [facility] Provider Note    Event Date/Time   First MD Initiated Contact with Patient 01/05/24 1856     (approximate)   History   Back Pain   HPI  Ricardo Wood is a 43 y.o. male with history of bipolar disorder, diabetes, nephrolithiasis, DVT on Eliquis, presenting with left flank and back pain.  States has been ongoing for 2 days.  Has some diarrhea.  Has urinary frequency but no dysuria or hematuria.  No nausea vomiting, fever, chest pain, shortness of breath.  States that he has been compliant with medication, not on insulin, states that his blood glucose has been 500s or more in the past, states his primary care doctor has been trying to increase his metformin as well as add on an injectable medication that he does not remember the name of.  On intermittent chart review, he was seen by his primary care provider in early April, history of diabetes, last A1c was 12.4%, she is increasing his medications to metformin XR as well as start him on Ozempic.     Physical Exam   Triage Vital Signs: ED Triage Vitals  Encounter Vitals Group     BP 01/05/24 1822 (!) 149/95     Systolic BP Percentile --      Diastolic BP Percentile --      Pulse Rate 01/05/24 1822 86     Resp 01/05/24 1822 19     Temp 01/05/24 1822 98.4 F (36.9 C)     Temp Source 01/05/24 1822 Oral     SpO2 01/05/24 1822 97 %     Weight 01/05/24 1823 (!) 309 lb (140.2 kg)     Height --      Head Circumference --      Peak Flow --      Pain Score 01/05/24 1822 6     Pain Loc --      Pain Education --      Exclude from Growth Chart --     Most recent vital signs: Vitals:   01/05/24 1822 01/05/24 2226  BP: (!) 149/95 (!) 120/90  Pulse: 86 90  Resp: 19 16  Temp: 98.4 F (36.9 C)   SpO2: 97% 97%     General: Awake, no distress.  CV:  Good peripheral perfusion.  Resp:  Normal effort.  Abd:  No distention.  Soft nontender Other:  No overlying rash, mild tenderness to the  left flank and left lower back, no midline spinal tenderness, no bilateral CVA tenderness   ED Results / Procedures / Treatments   Labs (all labs ordered are listed, but only abnormal results are displayed) Labs Reviewed  CBC WITH DIFFERENTIAL/PLATELET - Abnormal; Notable for the following components:      Result Value   Lymphs Abs 4.1 (*)    All other components within normal limits  COMPREHENSIVE METABOLIC PANEL WITH GFR - Abnormal; Notable for the following components:   Sodium 128 (*)    Chloride 93 (*)    Glucose, Bld 549 (*)    All other components within normal limits  URINALYSIS, ROUTINE W REFLEX MICROSCOPIC - Abnormal; Notable for the following components:   Color, Urine STRAW (*)    APPearance CLEAR (*)    Glucose, UA >=500 (*)    All other components within normal limits  CBG MONITORING, ED - Abnormal; Notable for the following components:   Glucose-Capillary 474 (*)    All other components within normal limits  CBG MONITORING, ED - Abnormal; Notable for the following components:   Glucose-Capillary 352 (*)    All other components within normal limits  RESP PANEL BY RT-PCR (RSV, FLU A&B, COVID)  [id number]     RADIOLOGY CT imaging on my independent interpretation without obvious hydronephrosis   PROCEDURES:  Critical Care performed: No  Procedures   MEDICATIONS ORDERED IN ED: Medications  lactated ringers bolus 1,000 mL (0 mLs Intravenous Stopped 01/05/24 2227)  insulin aspart (novoLOG) injection 8 Units (8 Units Subcutaneous Given 01/05/24 1924)  ketorolac (TORADOL) 15 MG/ML injection 15 mg (15 mg Intravenous Given 01/05/24 1929)  insulin aspart (novoLOG) injection 6 Units (6 Units Subcutaneous Given 01/05/24 2118)     IMPRESSION / MDM / ASSESSMENT AND PLAN / ED COURSE  I reviewed the triage vital signs and the nursing notes.                              Differential diagnosis includes, but is not limited to, nephrolithiasis, UTI, pyelonephritis, colitis,  diverticulitis, electrolyte derangements, mild dehydration, gastroenteritis, viral illness.  Will get labs, UA, CT renal stone was ordered in triage.  Patient's presentation is most consistent with acute presentation with potential threat to life or bodily function.  Independent review of labs and imaging are below.  Patient's glucose was initially high without evidence of HHS or DKA, given fluids and insulin with repeat glucose now 350.  On reassessment patient is feeling a lot better, shared decision making done with patient, discussed imaging results including incidental findings as well as labs, and he is agreeable with the plan for discharge, states that he has to just pick up his new prescription for metformin, will reach out to his primary care doctor tomorrow to see if they need to make any additional adjustments to his medications.  Will also give him a prescription for glucose monitoring device so that he can check his sugars.  Considered but no indication for inpatient admission at this time, he is safe for outpatient management.  Discharged with strict precautions.  Clinical Course as of 01/05/24 2301  Wed Jan 05, 2024  2052 CT Renal Stone Study IMPRESSION: 1. Nonobstructing right intrarenal calculi. No ureteral stones or hydronephrosis. 2. Hepatomegaly with hepatic steatosis. 3. 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   [TT]  2104 Independent review of labs and imaging, no leukocytosis, his glucose is 549, he has associated pseudohyponatremia, rest electrolytes severely deranged, no acidosis, no anion gap, doubt DKA, patient is also not altered, doubt HHS, UA not consistent with UTI. [TT]  2104 I had given him 8 units of insulin and some fluids, this is less than his weight-based dose of 0.1 units per kg.  His repeat glucose is 474, will give him another 6 units. [TT]  2105 Discussed with patient about his CT scan as well as lab  results, states that his PCP already sent his new metformin prescription to the pharmacy, he just needs to pick it up.  States that they are looking for options for cheaper Ozempic for him that he can use.   [TT]  2106 On reassessment, he has some tenderness to the left lower paralumbar region but no tenderness to the flank or abdomen. [TT]    Clinical Course User Index [TT] Jodie Echevaria Franchot Erichsen, MD     FINAL CLINICAL IMPRESSION(S) / ED DIAGNOSES   Final diagnoses:  Acute left-sided low  back pain, unspecified whether sciatica present  Kidney stones  Hepatic steatosis  Hyperglycemia     Rx / DC Orders   ED Discharge Orders          Ordered    Blood Glucose Monitoring Suppl DEVI  3 times daily        01/05/24 2301    Glucose Blood (BLOOD GLUCOSE TEST STRIPS) STRP  3 times daily        01/05/24 2301    Lancet Device MISC  3 times daily        01/05/24 2301    Lancets Misc. MISC  3 times daily        01/05/24 2301             Note:  This document was prepared using Dragon voice recognition software and may include unintentional dictation errors.    Claybon Jabs, MD 01/05/24 [phone]

## 2024-01-05 NOTE — Discharge Instructions (Addendum)
 Please follow-up with your primary care doctor, give them a call tomorrow to see if they can make your adjustments to your diabetes medications sooner rather than later.  These also follow-up with them for the incidental findings noted on the CT scan.  CT renal stone: IMPRESSION:  1. Nonobstructing right intrarenal calculi. No ureteral stones or  hydronephrosis.  2. Hepatomegaly with hepatic steatosis.  3. Chronic bilateral L5 pars interarticularis defects with trace  anterolisthesis of L5 on S1.

## 2024-01-06 ENCOUNTER — Telehealth: Payer: Self-pay

## 2024-01-06 LAB — CARDIOLIPIN ANTIBODIES, IGG, IGM, IGA
Anticardiolipin IgA: <9 U/mL (ref 0–11)
Anticardiolipin IgG: 9 GPL U/mL (ref 0–14)
Anticardiolipin IgM: <9 [MPL'U]/mL (ref 0–12)

## 2024-01-06 NOTE — Transitions of Care (Post Inpatient/ED Visit) (Signed)
 01/06/2024  Name: Ricardo Wood MRN: [id number] DOB: 1981/03/05  Today's TOC FU Call Status: Today's TOC FU Call Status:: Successful TOC FU Call Completed TOC FU Call Complete Date: 01/06/24 Patient's Name and Date of Birth confirmed.  Transition Care Management Follow-up Telephone Call Date of Discharge: 01/05/24 Discharge Facility: [facility]) Type of Discharge: Emergency Department Reason for ED Visit: Other: (Acute left-sided low back pain, unspecified whether sciatica present) How have you been since you were released from the hospital?: Better Any questions or concerns?: No  Items Reviewed: Did you receive and understand the discharge instructions provided?: Yes Medications obtained,verified, and reconciled?: Yes (Medications Reviewed) Any new allergies since your discharge?: No Dietary orders reviewed?: NA Do you have support at home?: Yes  Medications Reviewed Today: Medications Reviewed Today     Reviewed by Leigh Aurora, CMA (Certified Medical Assistant) on 01/06/24 at 1635  Med List Status: <None>   Medication Order Taking? Sig Documenting Provider Last Dose Status Informant  Blood Glucose Monitoring Suppl DEVI [id number]  1 each by Does not apply route in the morning, at noon, and at bedtime. May substitute to any manufacturer covered by patient's insurance. Claybon Jabs, MD  Active   budeson-glycopyrrolate-formoterol [facility] AEROSPHERE) 160-9-4.8 MCG/ACT Sandrea Matte [id number]  Inhale 2 puffs into the lungs 2 (two) times daily.  Patient not taking: Reported on 01/03/2024   Larae Grooms, NP  Active   ELIQUIS 5 MG TABS tablet [id number] No Take 1 tablet (5 mg total) by mouth 2 (two) times daily.  Patient not taking: Reported on 01/06/2024   Larae Grooms, NP Not Taking Active   Glucose Blood (BLOOD GLUCOSE TEST STRIPS) STRP [id number]  1 each by In Vitro route in the morning, at noon, and at bedtime. May substitute to any  manufacturer covered by patient's insurance. Claybon Jabs, MD  Active   Lancet Device MISC [id number]  1 each by Does not apply route in the morning, at noon, and at bedtime. May substitute to any manufacturer covered by patient's insurance. Claybon Jabs, MD  Active   Lancets Misc. MISC [id number]  1 each by Does not apply route in the morning, at noon, and at bedtime. May substitute to any manufacturer covered by patient's insurance. Claybon Jabs, MD  Active   metFORMIN (GLUCOPHAGE-XR) 500 MG 24 hr tablet [id number] Yes Take 2 tablets (1,000 mg total) by mouth 2 (two) times daily with a meal. Larae Grooms, NP Taking Active   methocarbamol (ROBAXIN) 500 MG tablet [id number] No Take by mouth.  Patient not taking: Reported on 01/06/2024   [provider] Not Taking Active   olmesartan (BENICAR) 20 MG tablet [id number] No Take 1 tablet (20 mg total) by mouth daily.  Patient not taking: Reported on 01/06/2024   Larae Grooms, NP Not Taking Active   rosuvastatin (CRESTOR) 20 MG tablet [id number]  Take 1 tablet (20 mg total) by mouth daily.  Patient not taking: Reported on 01/03/2024   Larae Grooms, NP  Active   Semaglutide,0.25 or 0.5MG /DOS, (OZEMPIC, 0.25 OR 0.5 MG/DOSE,) 2 MG/1.5ML SOPN [id number]  Inject 0.25 mg into the skin once a week. Start with 0.25MG  once a week x 4 weeks, then increase to 0.5MG  weekly.  Patient not taking: Reported on 01/03/2024   Larae Grooms, NP  Active             Home Care and Equipment/Supplies: Were Home Health Services Ordered?: NA Any new equipment or medical supplies ordered?:  NA  Functional Questionnaire: Do you need assistance with bathing/showering or dressing?: No Do you need assistance with meal preparation?: No Do you need assistance with eating?: No Do you have difficulty maintaining continence: No Do you need assistance with getting out of bed/getting out of a chair/moving?: No Do you have difficulty managing or taking your  medications?: No  Follow up appointments reviewed: PCP Follow-up appointment confirmed?: Yes Date of PCP follow-up appointment?: 01/11/24 Follow-up Provider: Larae Grooms, NP Specialist Hospital Follow-up appointment confirmed?: NA Do you need transportation to your follow-up appointment?: No Do you understand care options if your condition(s) worsen?: Yes-patient verbalized understanding    SIGNATURE Agnes Lawrence, CMA (AAMA)  [facility] [phone]

## 2024-01-07 ENCOUNTER — Other Ambulatory Visit: Payer: Self-pay

## 2024-01-07 DIAGNOSIS — M47814 Spondylosis without myelopathy or radiculopathy, thoracic region: Secondary | ICD-10-CM | POA: Diagnosis not present

## 2024-01-07 DIAGNOSIS — R42 Dizziness and giddiness: Secondary | ICD-10-CM | POA: Diagnosis present

## 2024-01-07 DIAGNOSIS — M47816 Spondylosis without myelopathy or radiculopathy, lumbar region: Secondary | ICD-10-CM | POA: Diagnosis not present

## 2024-01-07 DIAGNOSIS — Z794 Long term (current) use of insulin: Secondary | ICD-10-CM | POA: Insufficient documentation

## 2024-01-07 DIAGNOSIS — R109 Unspecified abdominal pain: Secondary | ICD-10-CM | POA: Diagnosis not present

## 2024-01-07 DIAGNOSIS — A419 Sepsis, unspecified organism: Secondary | ICD-10-CM | POA: Diagnosis not present

## 2024-01-07 DIAGNOSIS — Z79899 Other long term (current) drug therapy: Secondary | ICD-10-CM | POA: Diagnosis not present

## 2024-01-07 DIAGNOSIS — G8929 Other chronic pain: Secondary | ICD-10-CM | POA: Diagnosis not present

## 2024-01-07 DIAGNOSIS — M5124 Other intervertebral disc displacement, thoracic region: Secondary | ICD-10-CM | POA: Diagnosis not present

## 2024-01-07 DIAGNOSIS — E66813 Obesity, class 3: Secondary | ICD-10-CM | POA: Insufficient documentation

## 2024-01-07 DIAGNOSIS — R000 Tachycardia, unspecified: Secondary | ICD-10-CM | POA: Diagnosis not present

## 2024-01-07 DIAGNOSIS — M4319 Spondylolisthesis, multiple sites in spine: Secondary | ICD-10-CM | POA: Diagnosis not present

## 2024-01-07 DIAGNOSIS — M5126 Other intervertebral disc displacement, lumbar region: Secondary | ICD-10-CM | POA: Diagnosis not present

## 2024-01-07 DIAGNOSIS — M545 Low back pain, unspecified: Secondary | ICD-10-CM | POA: Diagnosis not present

## 2024-01-07 DIAGNOSIS — J449 Chronic obstructive pulmonary disease, unspecified: Secondary | ICD-10-CM | POA: Insufficient documentation

## 2024-01-07 DIAGNOSIS — K029 Dental caries, unspecified: Secondary | ICD-10-CM | POA: Diagnosis not present

## 2024-01-07 DIAGNOSIS — F1090 Alcohol use, unspecified, uncomplicated: Secondary | ICD-10-CM | POA: Diagnosis not present

## 2024-01-07 DIAGNOSIS — N200 Calculus of kidney: Secondary | ICD-10-CM | POA: Diagnosis not present

## 2024-01-07 DIAGNOSIS — R509 Fever, unspecified: Secondary | ICD-10-CM | POA: Insufficient documentation

## 2024-01-07 DIAGNOSIS — I82509 Chronic embolism and thrombosis of unspecified deep veins of unspecified lower extremity: Secondary | ICD-10-CM | POA: Diagnosis not present

## 2024-01-07 DIAGNOSIS — E1165 Type 2 diabetes mellitus with hyperglycemia: Secondary | ICD-10-CM | POA: Insufficient documentation

## 2024-01-07 DIAGNOSIS — Z1152 Encounter for screening for COVID-19: Secondary | ICD-10-CM | POA: Diagnosis not present

## 2024-01-07 DIAGNOSIS — Z87891 Personal history of nicotine dependence: Secondary | ICD-10-CM | POA: Insufficient documentation

## 2024-01-07 DIAGNOSIS — Z6841 Body Mass Index (BMI) 40.0 and over, adult: Secondary | ICD-10-CM | POA: Insufficient documentation

## 2024-01-07 DIAGNOSIS — Z0389 Encounter for observation for other suspected diseases and conditions ruled out: Secondary | ICD-10-CM | POA: Diagnosis not present

## 2024-01-07 DIAGNOSIS — M4804 Spinal stenosis, thoracic region: Secondary | ICD-10-CM | POA: Diagnosis not present

## 2024-01-07 DIAGNOSIS — K760 Fatty (change of) liver, not elsewhere classified: Secondary | ICD-10-CM | POA: Diagnosis not present

## 2024-01-07 DIAGNOSIS — Z8659 Personal history of other mental and behavioral disorders: Secondary | ICD-10-CM | POA: Diagnosis not present

## 2024-01-07 DIAGNOSIS — M5134 Other intervertebral disc degeneration, thoracic region: Secondary | ICD-10-CM | POA: Diagnosis not present

## 2024-01-07 DIAGNOSIS — M48061 Spinal stenosis, lumbar region without neurogenic claudication: Secondary | ICD-10-CM | POA: Diagnosis not present

## 2024-01-07 LAB — FACTOR 5 LEIDEN

## 2024-01-07 LAB — PROTHROMBIN GENE MUTATION

## 2024-01-07 NOTE — ED Triage Notes (Signed)
 Pt to ed from home via POV for fever and dizziness that started today. Pt is caox4, in no acute distress. Pt is clammy. Pt is having some back pain as well. Pt was diagnosed a few days ago for kidney stones and BGL >500.

## 2024-01-08 ENCOUNTER — Emergency Department

## 2024-01-08 ENCOUNTER — Other Ambulatory Visit: Payer: Self-pay

## 2024-01-08 ENCOUNTER — Observation Stay
Admission: EM | Admit: 2024-01-08 | Discharge: 2024-01-11 | Disposition: A | Discharge status: Home / Self Care | Attending: Internal Medicine | Admitting: Internal Medicine

## 2024-01-08 ENCOUNTER — Observation Stay

## 2024-01-08 DIAGNOSIS — I82409 Acute embolism and thrombosis of unspecified deep veins of unspecified lower extremity: Secondary | ICD-10-CM

## 2024-01-08 DIAGNOSIS — M5124 Other intervertebral disc displacement, thoracic region: Secondary | ICD-10-CM | POA: Diagnosis not present

## 2024-01-08 DIAGNOSIS — M47816 Spondylosis without myelopathy or radiculopathy, lumbar region: Secondary | ICD-10-CM | POA: Diagnosis not present

## 2024-01-08 DIAGNOSIS — A419 Sepsis, unspecified organism: Secondary | ICD-10-CM | POA: Diagnosis not present

## 2024-01-08 DIAGNOSIS — R509 Fever, unspecified: Principal | ICD-10-CM

## 2024-01-08 DIAGNOSIS — B9629 Other Escherichia coli [E. coli] as the cause of diseases classified elsewhere: Secondary | ICD-10-CM | POA: Diagnosis not present

## 2024-01-08 DIAGNOSIS — I82509 Chronic embolism and thrombosis of unspecified deep veins of unspecified lower extremity: Secondary | ICD-10-CM | POA: Diagnosis present

## 2024-01-08 DIAGNOSIS — E119 Type 2 diabetes mellitus without complications: Secondary | ICD-10-CM

## 2024-01-08 DIAGNOSIS — K047 Periapical abscess without sinus: Secondary | ICD-10-CM

## 2024-01-08 DIAGNOSIS — R6510 Systemic inflammatory response syndrome (SIRS) of non-infectious origin without acute organ dysfunction: Secondary | ICD-10-CM | POA: Diagnosis present

## 2024-01-08 DIAGNOSIS — J449 Chronic obstructive pulmonary disease, unspecified: Secondary | ICD-10-CM | POA: Diagnosis present

## 2024-01-08 DIAGNOSIS — M4319 Spondylolisthesis, multiple sites in spine: Secondary | ICD-10-CM | POA: Diagnosis not present

## 2024-01-08 DIAGNOSIS — M545 Low back pain, unspecified: Secondary | ICD-10-CM | POA: Diagnosis not present

## 2024-01-08 DIAGNOSIS — E1169 Type 2 diabetes mellitus with other specified complication: Secondary | ICD-10-CM | POA: Diagnosis not present

## 2024-01-08 DIAGNOSIS — M48061 Spinal stenosis, lumbar region without neurogenic claudication: Secondary | ICD-10-CM | POA: Diagnosis not present

## 2024-01-08 DIAGNOSIS — M4804 Spinal stenosis, thoracic region: Secondary | ICD-10-CM | POA: Diagnosis not present

## 2024-01-08 DIAGNOSIS — E669 Obesity, unspecified: Secondary | ICD-10-CM

## 2024-01-08 DIAGNOSIS — Z0389 Encounter for observation for other suspected diseases and conditions ruled out: Secondary | ICD-10-CM | POA: Diagnosis not present

## 2024-01-08 DIAGNOSIS — M47814 Spondylosis without myelopathy or radiculopathy, thoracic region: Secondary | ICD-10-CM | POA: Diagnosis not present

## 2024-01-08 DIAGNOSIS — R109 Unspecified abdominal pain: Secondary | ICD-10-CM | POA: Diagnosis not present

## 2024-01-08 DIAGNOSIS — N200 Calculus of kidney: Secondary | ICD-10-CM | POA: Diagnosis not present

## 2024-01-08 DIAGNOSIS — Z7984 Long term (current) use of oral hypoglycemic drugs: Secondary | ICD-10-CM

## 2024-01-08 DIAGNOSIS — M5134 Other intervertebral disc degeneration, thoracic region: Secondary | ICD-10-CM | POA: Diagnosis not present

## 2024-01-08 DIAGNOSIS — K760 Fatty (change of) liver, not elsewhere classified: Secondary | ICD-10-CM | POA: Diagnosis not present

## 2024-01-08 DIAGNOSIS — M5126 Other intervertebral disc displacement, lumbar region: Secondary | ICD-10-CM | POA: Diagnosis not present

## 2024-01-08 LAB — URINALYSIS, ROUTINE W REFLEX MICROSCOPIC
Bacteria, UA: NONE SEEN
Bilirubin Urine: NEGATIVE
Glucose, UA: >=500 mg/dL — AB
Hgb urine dipstick: NEGATIVE
Ketones, ur: 5 mg/dL — AB
Leukocytes,Ua: NEGATIVE
Nitrite: NEGATIVE
Protein, ur: 30 mg/dL — AB
Specific Gravity, Urine: 1.028 (ref 1.005–1.030)
pH: 5 (ref 5.0–8.0)

## 2024-01-08 LAB — HEPATIC FUNCTION PANEL
ALT: 40 U/L (ref 0–44)
AST: 26 U/L (ref 15–41)
Albumin: 3.9 g/dL (ref 3.5–5.0)
Alkaline Phosphatase: 74 U/L (ref 38–126)
Bilirubin, Direct: 0.1 mg/dL (ref 0.0–0.2)
Indirect Bilirubin: 0.5 mg/dL (ref 0.3–0.9)
Total Bilirubin: 0.6 mg/dL (ref 0.0–1.2)
Total Protein: 7.6 g/dL (ref 6.5–8.1)

## 2024-01-08 LAB — RESPIRATORY PANEL BY PCR

## 2024-01-08 LAB — TROPONIN I (HIGH SENSITIVITY)
Troponin I (High Sensitivity): 6 ng/L (ref <18)
Troponin I (High Sensitivity): 7 ng/L (ref <18)

## 2024-01-08 LAB — CBG MONITORING, ED
Glucose-Capillary: 276 mg/dL — ABNORMAL HIGH (ref 70–99)
Glucose-Capillary: 292 mg/dL — ABNORMAL HIGH (ref 70–99)
Glucose-Capillary: 299 mg/dL — ABNORMAL HIGH (ref 70–99)
Glucose-Capillary: 327 mg/dL — ABNORMAL HIGH (ref 70–99)
Glucose-Capillary: 337 mg/dL — ABNORMAL HIGH (ref 70–99)
Glucose-Capillary: 346 mg/dL — ABNORMAL HIGH (ref 70–99)
Glucose-Capillary: 348 mg/dL — ABNORMAL HIGH (ref 70–99)

## 2024-01-08 LAB — GASTROINTESTINAL PANEL BY PCR, STOOL (REPLACES STOOL CULTURE)

## 2024-01-08 LAB — CBC
HCT: 48.1 % (ref 39.0–52.0)
Hemoglobin: 16.5 g/dL (ref 13.0–17.0)
MCH: 30.4 pg (ref 26.0–34.0)
MCHC: 34.3 g/dL (ref 30.0–36.0)
MCV: 88.7 fL (ref 80.0–100.0)
Platelets: 166 10*3/uL (ref 150–400)
RBC: 5.42 MIL/uL (ref 4.22–5.81)
RDW: 12.3 % (ref 11.5–15.5)
WBC: 8.3 10*3/uL (ref 4.0–10.5)
nRBC: 0 % (ref 0.0–0.2)

## 2024-01-08 LAB — LACTIC ACID, PLASMA
Lactic Acid, Venous: 1.2 mmol/L (ref 0.5–1.9)
Lactic Acid, Venous: 2 mmol/L (ref 0.5–1.9)

## 2024-01-08 LAB — BASIC METABOLIC PANEL WITH GFR
Anion gap: 11 (ref 5–15)
BUN: 22 mg/dL — ABNORMAL HIGH (ref 6–20)
CO2: 22 mmol/L (ref 22–32)
Calcium: 9.3 mg/dL (ref 8.9–10.3)
Chloride: 100 mmol/L (ref 98–111)
Creatinine, Ser: 0.82 mg/dL (ref 0.61–1.24)
GFR, Estimated: >60 mL/min (ref >60)
Glucose, Bld: 286 mg/dL — ABNORMAL HIGH (ref 70–99)
Potassium: 4 mmol/L (ref 3.5–5.1)
Sodium: 133 mmol/L — ABNORMAL LOW (ref 135–145)

## 2024-01-08 LAB — BETA-HYDROXYBUTYRIC ACID: Beta-Hydroxybutyric Acid: 0.22 mmol/L (ref 0.05–0.27)

## 2024-01-08 LAB — RESP PANEL BY RT-PCR (RSV, FLU A&B, COVID)  RVPGX2
Influenza A by PCR: NEGATIVE
Influenza B by PCR: NEGATIVE
Resp Syncytial Virus by PCR: NEGATIVE
SARS Coronavirus 2 by RT PCR: NEGATIVE

## 2024-01-08 LAB — PROCALCITONIN: Procalcitonin: 0.23 ng/mL

## 2024-01-08 LAB — HIV ANTIBODY (ROUTINE TESTING W REFLEX): HIV Screen 4th Generation wRfx: NONREACTIVE

## 2024-01-08 MED ORDER — ONDANSETRON HCL 4 MG/2ML IJ SOLN: 4.0000 mg | Freq: Four times a day (QID) | INTRAMUSCULAR | Status: DC | PRN

## 2024-01-08 MED ORDER — GADOBUTROL 1 MMOL/ML IV SOLN
10.0000 mL | Freq: Once | INTRAVENOUS | Status: AC | PRN
  Administered 2024-01-08: 10 mL via INTRAVENOUS

## 2024-01-08 MED ORDER — SODIUM CHLORIDE 0.9 % IV BOLUS (SEPSIS)
1000.0000 mL | Freq: Once | INTRAVENOUS | Status: AC
  Administered 2024-01-08: 1000 mL via INTRAVENOUS

## 2024-01-08 MED ORDER — INSULIN ASPART 100 UNIT/ML IJ SOLN
4.0000 [IU] | Freq: Three times a day (TID) | INTRAMUSCULAR | Status: DC
  Administered 2024-01-08 – 2024-01-11 (×10): 4 [IU] via SUBCUTANEOUS
  Filled 2024-01-08 (×10): qty 1

## 2024-01-08 MED ORDER — SODIUM CHLORIDE 0.9 % IV SOLN
2.0000 g | Freq: Once | INTRAVENOUS | Status: AC
  Administered 2024-01-08: 2 g via INTRAVENOUS
  Filled 2024-01-08: qty 20

## 2024-01-08 MED ORDER — ACETAMINOPHEN 325 MG PO TABS
650.0000 mg | ORAL_TABLET | Freq: Four times a day (QID) | ORAL | Status: DC | PRN
  Administered 2024-01-08: 650 mg via ORAL
  Filled 2024-01-08: qty 2

## 2024-01-08 MED ORDER — INSULIN ASPART 100 UNIT/ML IJ SOLN
0.0000 [IU] | Freq: Three times a day (TID) | INTRAMUSCULAR | Status: DC
  Administered 2024-01-08: 8 [IU] via SUBCUTANEOUS
  Administered 2024-01-08 (×2): 11 [IU] via SUBCUTANEOUS
  Administered 2024-01-09: 15 [IU] via SUBCUTANEOUS
  Administered 2024-01-09 – 2024-01-10 (×3): 8 [IU] via SUBCUTANEOUS
  Administered 2024-01-10: 11 [IU] via SUBCUTANEOUS
  Administered 2024-01-10: 8 [IU] via SUBCUTANEOUS
  Administered 2024-01-11: 3 [IU] via SUBCUTANEOUS
  Filled 2024-01-08 (×10): qty 1

## 2024-01-08 MED ORDER — ACETAMINOPHEN 325 MG PO TABS
650.0000 mg | ORAL_TABLET | Freq: Four times a day (QID) | ORAL | Status: DC | PRN
  Administered 2024-01-11: 650 mg via ORAL
  Filled 2024-01-08: qty 2

## 2024-01-08 MED ORDER — SODIUM CHLORIDE 0.9 % IV SOLN
2.0000 g | Freq: Three times a day (TID) | INTRAVENOUS | Status: DC
  Administered 2024-01-08: 2 g via INTRAVENOUS
  Filled 2024-01-08 (×2): qty 12.5

## 2024-01-08 MED ORDER — SODIUM CHLORIDE 0.9 % IV SOLN
500.0000 mg | INTRAVENOUS | Status: DC
  Administered 2024-01-08: 500 mg via INTRAVENOUS
  Filled 2024-01-08: qty 5

## 2024-01-08 MED ORDER — VANCOMYCIN HCL 2000 MG/400ML IV SOLN
2000.0000 mg | Freq: Two times a day (BID) | INTRAVENOUS | Status: DC
  Filled 2024-01-08: qty 400

## 2024-01-08 MED ORDER — LACTATED RINGERS IV SOLN
150.0000 mL/h | INTRAVENOUS | Status: AC
  Administered 2024-01-08 – 2024-01-09 (×3): 150 mL/h via INTRAVENOUS

## 2024-01-08 MED ORDER — METRONIDAZOLE 500 MG/100ML IV SOLN
500.0000 mg | Freq: Two times a day (BID) | INTRAVENOUS | Status: DC
  Administered 2024-01-08: 500 mg via INTRAVENOUS
  Filled 2024-01-08: qty 100

## 2024-01-08 MED ORDER — IOHEXOL 350 MG/ML SOLN
100.0000 mL | Freq: Once | INTRAVENOUS | Status: AC | PRN
  Administered 2024-01-08: 100 mL via INTRAVENOUS

## 2024-01-08 MED ORDER — SODIUM CHLORIDE 0.9 % IV SOLN
2.0000 g | INTRAVENOUS | Status: AC
  Administered 2024-01-08 – 2024-01-10 (×3): 2 g via INTRAVENOUS
  Filled 2024-01-08 (×3): qty 20

## 2024-01-08 MED ORDER — ACETAMINOPHEN 325 MG PO TABS
650.0000 mg | ORAL_TABLET | Freq: Once | ORAL | Status: AC
  Administered 2024-01-08: 650 mg via ORAL
  Filled 2024-01-08: qty 2

## 2024-01-08 MED ORDER — IOHEXOL 300 MG/ML  SOLN
80.0000 mL | Freq: Once | INTRAMUSCULAR | Status: AC | PRN
  Administered 2024-01-08: 80 mL via INTRAVENOUS

## 2024-01-08 MED ORDER — ENOXAPARIN SODIUM 80 MG/0.8ML IJ SOSY
0.5000 mg/kg | PREFILLED_SYRINGE | INTRAMUSCULAR | Status: DC
  Administered 2024-01-08 – 2024-01-11 (×4): 70 mg via SUBCUTANEOUS
  Filled 2024-01-08 (×4): qty 0.7

## 2024-01-08 MED ORDER — VANCOMYCIN HCL 1500 MG/300ML IV SOLN
1500.0000 mg | Freq: Once | INTRAVENOUS | Status: AC
  Administered 2024-01-08: 1500 mg via INTRAVENOUS
  Filled 2024-01-08: qty 300

## 2024-01-08 MED ORDER — METRONIDAZOLE 500 MG/100ML IV SOLN
500.0000 mg | Freq: Once | INTRAVENOUS | Status: AC
  Administered 2024-01-08: 500 mg via INTRAVENOUS
  Filled 2024-01-08: qty 100

## 2024-01-08 MED ORDER — METRONIDAZOLE 500 MG/100ML IV SOLN
500.0000 mg | Freq: Two times a day (BID) | INTRAVENOUS | Status: AC
Start: 2024-01-08 — End: 2024-01-10
  Administered 2024-01-08 – 2024-01-10 (×5): 500 mg via INTRAVENOUS
  Filled 2024-01-08 (×5): qty 100

## 2024-01-08 MED ORDER — ONDANSETRON HCL 4 MG PO TABS: 4.0000 mg | ORAL_TABLET | Freq: Four times a day (QID) | ORAL | Status: DC | PRN

## 2024-01-08 MED ORDER — VANCOMYCIN HCL IN DEXTROSE 1-5 GM/200ML-% IV SOLN
1000.0000 mg | Freq: Once | INTRAVENOUS | Status: AC
  Administered 2024-01-08: 1000 mg via INTRAVENOUS
  Filled 2024-01-08: qty 200

## 2024-01-08 NOTE — Assessment & Plan Note (Addendum)
 Noted chronic recurrent DVT Followed by Dr. Randy Buttery outpatient Has been intermittently compliant with anticoagulation Will place on prophylactic Lovenox for now pending outpatient follow-up Monitor

## 2024-01-08 NOTE — Assessment & Plan Note (Signed)
 Blood sugar to 80s SSI A1c Monitor

## 2024-01-08 NOTE — Progress Notes (Addendum)
 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 Cassandria Clever with infectious disease Per Dr. Francee Inch- symptoms fairly mild.  Will switch to azithromycin and otherwise symptomatic management  Follow

## 2024-01-08 NOTE — ED Notes (Signed)
 Patient transported to MRI

## 2024-01-08 NOTE — ED Provider Notes (Signed)
 [facility] Provider Note    Event Date/Time   First MD Initiated Contact with Patient 01/08/24 0018     (approximate)   History   Dizziness   HPI  Ricardo Wood is a 43 year old male presenting to the emergency department for evaluation of fever and weakness.  Patient was seen in our ER on 4/9 for left flank pain.  CT demonstrated nonobstructive stones, also noted to have hyperglycemia without DKA.  Earlier today he developed fever with worsening midline back pain.  No history of recent trauma.  Additionally reports feeling weak in his bilateral legs.  Denies history of similar.  No bowel or bladder incontinence.     Physical Exam   Triage Vital Signs: ED Triage Vitals  Encounter Vitals Group     BP 01/07/24 2358 (!) 124/95     Systolic BP Percentile --      Diastolic BP Percentile --      Pulse Rate 01/07/24 2358 (!) 145     Resp 01/07/24 2358 20     Temp 01/07/24 2358 (!) 101.5 F (38.6 C)     Temp Source 01/07/24 2358 Oral     SpO2 01/07/24 2358 98 %     Weight --      Height 01/07/24 2359 6' (1.829 m)     Head Circumference --      Peak Flow --      Pain Score 01/07/24 2359 0     Pain Loc --      Pain Education --      Exclude from Growth Chart --     Most recent vital signs: Vitals:   01/08/24 0508 01/08/24 0510  BP:    Pulse: 100   Resp:    Temp:  99.5 F (37.5 C)  SpO2: 96%      General: Awake, interactive  HEENT: Poor dentition  CV:  Tachycardia with heart rates in the 130s to 140s, regular, good peripheral perfusion Resp:  Unlabored respirations, lungs good auscultation Abd:  Nondistended, soft, mild generalized tenderness Neuro:  Symmetric facial movement, fluid speech, 4-5 strength of the bilateral lower extremities, slightly weaker on the left, intact sensation of bilateral lower extremities  Back:  Midline tenderness throughout the lower thoracic and lumbar spine, left paraspinous muscle tenderness as well, no  tenderness of the upper thoracic and cervical spine  ED Results / Procedures / Treatments   Labs (all labs ordered are listed, but only abnormal results are displayed) Labs Reviewed  BASIC METABOLIC PANEL WITH GFR - Abnormal; Notable for the following components:      Result Value   Sodium 133 (*)    Glucose, Bld 286 (*)    BUN 22 (*)    All other components within normal limits  URINALYSIS, ROUTINE W REFLEX MICROSCOPIC - Abnormal; Notable for the following components:   Color, Urine YELLOW (*)    APPearance CLEAR (*)    Glucose, UA >=500 (*)    Ketones, ur 5 (*)    Protein, ur 30 (*)    All other components within normal limits  LACTIC ACID, PLASMA - Abnormal; Notable for the following components:   Lactic Acid, Venous 2.0 (*)    All other components within normal limits  CBG MONITORING, ED - Abnormal; Notable for the following components:   Glucose-Capillary 276 (*)    All other components within normal limits  RESP PANEL BY RT-PCR (RSV, FLU A&B, COVID)  [id number]  CULTURE, BLOOD (  ROUTINE X 2)  CULTURE, BLOOD (ROUTINE X 2)  CBC  BETA-HYDROXYBUTYRIC ACID  LACTIC ACID, PLASMA  HEPATIC FUNCTION PANEL  CBG MONITORING, ED  TROPONIN I (HIGH SENSITIVITY)  TROPONIN I (HIGH SENSITIVITY)     EKG EKG independently reviewed interpreted by myself (ER attending) demonstrates:    RADIOLOGY Imaging independently reviewed and interpreted by myself demonstrates:   Chest x-Elishah Ashmore without focal consolidation  CT abdomen pelvis without acute findings MRI T and L-spine without epidural abscess of other acute findings   MR Lumbar Spine W Wo Contrast Result Date: 01/08/2024 CLINICAL DATA:  Initial evaluation for acute low back pain, infection suspected. EXAM: MRI LUMBAR SPINE WITHOUT AND WITH CONTRAST TECHNIQUE: Multiplanar and multiecho pulse sequences of the lumbar spine were obtained without and with intravenous contrast. CONTRAST:  10mL GADAVIST GADOBUTROL 1 MMOL/ML IV SOLN COMPARISON:   None Available. FINDINGS: Segmentation: Standard. Lowest well-formed disc space labeled the L5-S1 level. Alignment: Chronic bilateral pars defects at L5 with associated 6 mm spondylolisthesis of L5 on S1. Trace stepwise degenerative retrolisthesis of L1 on L2 through L4 on L5. Vertebrae: Vertebral body height maintained without acute or chronic fracture. Bone marrow signal intensity within normal limits. No worrisome osseous lesions. Reactive endplate change with mild marrow edema and enhancement about the L1-2 interspace noted, favored to be degenerative in nature. No other evidence for osteomyelitis discitis or septic arthritis within the lumbar spine. Conus medullaris and cauda equina: Conus extends to the T12 level. Conus and cauda equina appear normal. Paraspinal and other soft tissues: Unremarkable. Disc levels: L1-2: Degenerative intervertebral disc space narrowing with diffuse disc bulge and disc desiccation. Reactive endplate spurring. Mild bilateral facet hypertrophy. Resultant mild spinal stenosis. Foramina remain patent. L2-3: Diffuse disc bulge with disc desiccation. Reactive endplate spurring. Mild bilateral facet hypertrophy. Mild epidural lipomatosis. Resultant mild-to-moderate spinal stenosis. Mild bilateral L2 foraminal narrowing. L3-4: Mild diffuse disc bulge. Superimposed small central disc protrusion minimally indents the ventral epidural fat. Mild facet hypertrophy. Epidural lipomatosis. Moderate spinal stenosis, largely due to epidural lipomatosis. Mild to moderate bilateral L3 foraminal narrowing. L4-5: Minimal disc bulge. Mild facet spurring. Epidural lipomatosis. No significant spinal stenosis. Mild-to-moderate left greater than right L4 foraminal stenosis. L5-S1: Chronic bilateral pars defects with 6 mm spondylolisthesis. Associated broad posterior pseudo disc bulge/uncovering. Moderate facet hypertrophy. Epidural lipomatosis. No spinal stenosis. Moderate right with mild left L5 foraminal  stenosis. IMPRESSION: 1. No MRI evidence for acute infection within the lumbar spine. 2. 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 3. Multifactorial degenerative changes at L1-2 through L4-5 with resultant mild to moderate diffuse spinal stenosis, with mild to moderate bilateral L2 through L4 foraminal narrowing as above. Electronically Signed   By: Virgia Griffins M.D.   On: 01/08/2024 03:22   MR THORACIC SPINE W WO CONTRAST Result Date: 01/08/2024 CLINICAL DATA:  Initial evaluation for mid back pain, neuro deficit, fever, evaluate epidural abscess. EXAM: MRI THORACIC WITHOUT AND WITH CONTRAST TECHNIQUE: Multiplanar and multiecho pulse sequences of the thoracic spine were obtained without and with intravenous contrast. CONTRAST:  10mL GADAVIST GADOBUTROL 1 MMOL/ML IV SOLN COMPARISON:  None Available. FINDINGS: Alignment: Straightening of the normal thoracic kyphosis. Underlying trace dextroscoliosis. Trace anterolisthesis of T8 on T9. Vertebrae: Vertebral body height maintained without acute or chronic fracture. Multiple scattered chronic endplate Schmorl's node deformities noted throughout the mid and lower thoracic spine. Bone marrow signal intensity within normal limits. No worrisome osseous  lesions. No evidence for osteomyelitis discitis or septic arthritis. Cord: Normal signal and morphology. No epidural collections. No abnormal enhancement. Paraspinal and other soft tissues: Unremarkable. Disc levels: T1-2: Disc desiccation with mild disc bulge and endplate spurring. No stenosis. T2-3: Mild disc bulge with endplate spurring. Mild facet hypertrophy. No significant stenosis. T3-4: Mild disc bulge with superimposed small right paracentral disc protrusion. Mild posterior element hypertrophy. No significant stenosis. T4-5: Mild disc bulge with endplate spurring. Mild posterior element hypertrophy. No  stenosis. T5-6: Left paracentral disc protrusion flattens the ventral thecal sac. Mild cord flattening without cord signal changes or significant spinal stenosis. Foramina remain patent. T6-7: Mild disc bulge. No spinal stenosis. Foramina remain patent. T7-8: Mild disc bulge. Left greater than right facet hypertrophy. No spinal stenosis. Moderate left foraminal narrowing. Right neural foramen remains patent. T8-9: Trace anterolisthesis. Disc desiccation with mild disc bulge. Moderate bilateral facet hypertrophy. No spinal stenosis. Moderate bilateral foraminal narrowing. T9-10: Disc desiccation with mild disc bulge. Mild bilateral facet hypertrophy. No significant spinal stenosis. Foramina remain patent. T10-11: Diffuse disc bulge, asymmetric to the right. Reactive endplate spurring. Right worse than left facet hypertrophy. No significant spinal stenosis. Foramina remain patent. T11-12: Minimal disc bulge. Mild left-sided facet hypertrophy. No spinal stenosis. Mild left foraminal narrowing. Right neural foramen remains patent. T12-L1:  Negative interspace.  No canal or foraminal stenosis. IMPRESSION: 1. No MRI evidence for acute infection within the thoracic spine. 2. Multilevel thoracic spondylosis without significant spinal stenosis. Moderate left foraminal narrowing at T7-8, with moderate bilateral foraminal narrowing at T8-9. Electronically Signed   By: Virgia Griffins M.D.   On: 01/08/2024 03:14   CT ABDOMEN PELVIS W CONTRAST Result Date: 01/08/2024 CLINICAL DATA:  Abdominal pain, fever EXAM: CT ABDOMEN AND PELVIS WITH CONTRAST TECHNIQUE: Multidetector CT imaging of the abdomen and pelvis was performed using the standard protocol following bolus administration of intravenous contrast. RADIATION DOSE REDUCTION: This exam was performed according to the departmental dose-optimization program which includes automated exposure control, adjustment of the mA and/or kV according to patient size and/or use of  iterative reconstruction technique. CONTRAST:  OMNIPAQUE IOHEXOL 350 MG/ML SOLN COMPARISON:  01/05/2024 FINDINGS: Lower chest: No acute abnormality Hepatobiliary: Diffuse low-density throughout the liver compatible with fatty infiltration. No focal abnormality. Gallbladder unremarkable. Pancreas: No focal abnormality or ductal dilatation. Spleen: No focal abnormality.  Normal size. Adrenals/Urinary Tract: 3 mm nonobstructing stones in the mid and lower pole of the right kidney. No renal stones on the left. No ureteral stones or hydronephrosis bilaterally. Adrenal glands and urinary bladder unremarkable. Stomach/Bowel: Normal appendix. Stomach, large and small bowel grossly unremarkable. Vascular/Lymphatic: No evidence of aneurysm or adenopathy. IVC filter in the infrarenal IVC. Reproductive: No visible focal abnormality. Other: No free fluid or free air. Musculoskeletal: No acute bony abnormality. IMPRESSION: No acute findings in the abdomen or pelvis. Right nephrolithiasis.  No hydronephrosis. Hepatic steatosis. Electronically Signed   By: Janeece Mechanic M.D.   On: 01/08/2024 01:25   DG Chest Port 1 View Result Date: 01/08/2024 CLINICAL DATA:  Questionable sepsis - evaluate for abnormality EXAM: PORTABLE CHEST 1 VIEW COMPARISON:  Chest x-Shaneese Tait 09/08/2023, CT chest 09/08/2023 FINDINGS: The heart and mediastinal contours are unchanged. No focal consolidation. No pulmonary edema. No pleural effusion. No pneumothorax. No acute osseous abnormality. IMPRESSION: No active disease. Electronically Signed   By: Morgane  Naveau M.D.   On: 01/08/2024 01:10    PROCEDURES:  Critical Care performed: Yes, see critical care procedure note(s)  CRITICAL CARE Performed  by: Claria Crofts   Total critical care time: 32 minutes  Critical care time was exclusive of separately billable procedures and treating other patients.  Critical care was necessary to treat or prevent imminent or life-threatening  deterioration.  Critical care was time spent personally by me on the following activities: development of treatment plan with patient and/or surrogate as well as nursing, discussions with consultants, evaluation of patient's response to treatment, examination of patient, obtaining history from patient or surrogate, ordering and performing treatments and interventions, ordering and review of laboratory studies, ordering and review of radiographic studies, pulse oximetry and re-evaluation of patient's condition.   Procedures   MEDICATIONS ORDERED IN ED: Medications  acetaminophen (TYLENOL) tablet 650 mg (650 mg Oral Given 01/08/24 0005)  sodium chloride 0.9 % bolus 1,000 mL (0 mLs Intravenous Stopped 01/08/24 0318)  cefTRIAXone (ROCEPHIN) 2 g in sodium chloride 0.9 % 100 mL IVPB (0 g Intravenous Stopped 01/08/24 0249)  metroNIDAZOLE (FLAGYL) IVPB 500 mg (0 mg Intravenous Stopped 01/08/24 0319)  iohexol (OMNIPAQUE) 350 MG/ML injection 100 mL (100 mLs Intravenous Contrast Given 01/08/24 0107)  gadobutrol (GADAVIST) 1 MMOL/ML injection 10 mL (10 mLs Intravenous Contrast Given 01/08/24 0147)     IMPRESSION / MDM / ASSESSMENT AND PLAN / ED COURSE  I reviewed the triage vital signs and the nursing notes.  Differential diagnosis includes, but is not limited to, pyelonephritis, epidural abscess, other acute spinal cord pathology, viral illness, anemia, electrolyte abnormality  Patient's presentation is most consistent with acute presentation with potential threat to life or bodily function.  43 year old male presenting to the emergency department for evaluation of fever and weakness.  Febrile and tachycardic on presentation.  Stable BP.  Labs with normal white blood cell count.  Lactate elevated at 2.  Sepsis orders initiated with fluids, empiric Rocephin and Flagyl.  With midline back pain and lower extremity weakness, will obtain MRIs of the T and L-spine to evaluate for epidural abscess or other acute  spinal cord pathology.   Imaging overall reassuring including MRIs. After antipyretics, patient's fever did improve, remained tachycardic but improved with heart rates in low 100's.  I remain concerned for infection in this patient, though his source is somewhat unclear.  Given his degree of fever and tachycardia initially, do think admission is reasonable for further evaluation.  Will reach out to hospitalist team.  Case reviewed with hospitalist team who will evaluate for anticipated admission.       FINAL CLINICAL IMPRESSION(S) / ED DIAGNOSES   Final diagnoses:  Fever, unspecified fever cause  Acute midline low back pain without sciatica     Rx / DC Orders   ED Discharge Orders     None        Note:  This document was prepared using Dragon voice recognition software and may include unintentional dictation errors.   Claria Crofts, MD 01/08/24 [phone]

## 2024-01-08 NOTE — ED Notes (Signed)
 Recliner given to pt family member in room.

## 2024-01-08 NOTE — Consult Note (Signed)
 Pharmacy Antibiotic Note  Ricardo Wood is a 43 y.o. male admitted on 01/08/2024 with sepsis.  Pharmacy has been consulted for cefepime and vancomycin dosing. Tmax 101.5, WBC 8.3, lactic acid 1.2.   Plan: Will start cefepime 2 g q8H.   Will give vancomycin 2500 mg total loading dose. Will start vancomycin 2000 mg q12H. Predicted AUC of 502. Goal AUC of 400-600. Vd 0.5, IBW, Scr 0.82. Plan to obtain vancomycin level after 4th or 5th dose.   Height: 6' (182.9 cm) IBW/kg (Calculated) : 77.6  Temp (24hrs), Avg:99.8 F (37.7 C), Min:99 F (37.2 C), Max:101.5 F (38.6 C)  Recent Labs  Lab 01/05/24 1828 01/08/24 0009 01/08/24 0010 01/08/24 0310  WBC 9.3 8.3  --   --   CREATININE 0.85 0.82  --   --   LATICACIDVEN  --   --  2.0* 1.2    Estimated Creatinine Clearance: 173.8 mL/min (by C-G formula based on SCr of 0.82 mg/dL).    Allergies  Allergen Reactions   Onion    Other Rash    Allergic to adhesive on fabric bandages    Antimicrobials this admission: 4/12 vancomycin >>  4/12 cefepime >>   Dose adjustments this admission: None  Microbiology results: 4/12 BCx: pending 4/12 resp panel: pending.   Thank you for allowing pharmacy to be a part of this patient's care.  Trinidad Funk 01/08/2024 8:39 AM

## 2024-01-08 NOTE — Evaluation (Signed)
 Occupational Therapy Evaluation Patient Details Name: Ricardo Wood MRN: [id number] DOB: 04-30-1981 Today's Date: 01/08/2024   History of Present Illness   43 y/o male presented to ED on 01/07/24 for fever and dizziness. Admitted for possible sepsis. PMH: T2DM, hx of DVT, schizophrenia, bipolar disorder     Clinical Impressions Upon entering the room, pt seated on EOB and fully dressed. He is agreeable to OT evaluation. He reports living at home with wife, working full time, and being Ind at baseline. 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 Pt endorses feeling hot and being dizzy during session with vitals remaining stable throughout. Pt demonstrates functional mobility and self care tasks without physical assistance or LOB this session. Pt reports, " I just feel like crap". Pt with no need for skilled OT intervention at this time. OT to complete orders.      Functional Status Assessment   Patient has not had a recent decline in their functional status     Equipment Recommendations   None recommended by OT     Recommendations for Other Services         Precautions/Restrictions   Precautions Precautions: None Recall of Precautions/Restrictions: Intact     Mobility Bed Mobility               General bed mobility comments: sitting on EOB on arrival    Transfers   Equipment used: None Transfers: Sit to/from Stand Sit to Stand: Supervision                  Balance Overall balance assessment: Mild deficits observed, not formally tested                                         ADL either performed or assessed with clinical judgement   ADL Overall ADL's : Modified independent                                             Vision Patient Visual Report: No change from baseline              Pertinent Vitals/Pain Pain Assessment Pain Assessment: No/denies  pain     Extremity/Trunk Assessment Upper Extremity Assessment Upper Extremity Assessment: Generalized weakness   Lower Extremity Assessment Lower Extremity Assessment: Generalized weakness       Communication Communication Communication: No apparent difficulties   Cognition Arousal: Alert Behavior During Therapy: WFL for tasks assessed/performed                                 Following commands: Intact                  Home Living Family/patient expects to be discharged to:: Private residence Living Arrangements: Spouse/significant other Available Help at Discharge: Family Type of Home: House Home Access: Stairs to enter Secretary/administrator of Steps: 3 Entrance Stairs-Rails: Right;Left;Can reach both Home Layout: One level     Bathroom Shower/Tub: Runner, broadcasting/film/video: None          Prior Functioning/Environment Prior Level of Function : Independent/Modified Independent;Working/employed;Driving  OT Goals(Current goals can be found in the care plan section)   Acute Rehab OT Goals Patient Stated Goal: to return home OT Goal Formulation: With patient Time For Goal Achievement: 01/08/24 Potential to Achieve Goals: Good   OT Frequency:          AM-PAC OT "6 Clicks" Daily Activity     Outcome Measure Help from another person eating meals?: None Help from another person taking care of personal grooming?: None Help from another person toileting, which includes using toliet, bedpan, or urinal?: None Help from another person bathing (including washing, rinsing, drying)?: None Help from another person to put on and taking off regular upper body clothing?: None Help from another person to put on and taking off regular lower body clothing?: None 6 Click Score: 24   End of Session Nurse Communication: Mobility status;Other (comment) (skin rash- possible allergy)  Activity  Tolerance: Patient tolerated treatment well Patient left: in bed                   Time: 1011-1031 OT Time Calculation (min): 20 min Charges:  OT General Charges $OT Visit: 1 Visit OT Evaluation $OT Eval Low Complexity: 1 Low George Kinder, MS, OTR/L , CBIS ascom [phone]  01/08/24, 1:45 PM

## 2024-01-08 NOTE — ED Notes (Signed)
 Pt in MRI- delayed start in meds.

## 2024-01-08 NOTE — ED Notes (Signed)
 Patient sweating. Cold washcloth applied to patients head.

## 2024-01-08 NOTE — Progress Notes (Signed)
 CODE SEPSIS - PHARMACY COMMUNICATION  **Broad Spectrum Antibiotics should be administered within 1 hour of Sepsis diagnosis**  Time Code Sepsis Called/Page Received: 4/12 @ 0045  Antibiotics Ordered: Ceftriaxone , metronidazole   Time of 1st antibiotic administration: Ceftriaxone 2 gm IV X 1 on 4/12 @ 0219   Additional action taken by pharmacy:   If necessary, Name of Provider/Nurse Contacted:     Kavina Cantave D ,PharmD Clinical Pharmacist  01/08/2024  2:58 AM

## 2024-01-08 NOTE — ED Notes (Addendum)
 Blue top sent down, and 1 set of cultures and red top.

## 2024-01-08 NOTE — ED Notes (Addendum)
 Rn called into room by PT and OT for patient stating they feel itchy. Patient has red hands and bumps to lower arms and hands. MD notified. Patietn AOX4. Patients respiratory system WNL. O2: 96% on room air.  Maxipime administered and finished at 0923 patient was not complaining of symptoms. 1st Vancomycin finished. 2nd Vancomycin paused. MD consulted if 3rd antibiotic should be administered.

## 2024-01-08 NOTE — Assessment & Plan Note (Signed)
 Fairly stable from a respiratory standpoint Continue home inhaler

## 2024-01-08 NOTE — Assessment & Plan Note (Signed)
 Acute low back pain associated with recent strenuous activity with known overlapping sepsis versus SIRS MRI T and L-spine noted with lumbar stenosis changes at multilevel No overt infection noted concerning for discitis Recent strenuous activity associated with work or heavy lifting as a confounding issue Continue with broad-spectrum antibiotic coverage Pain control PT OT evaluation Monitor

## 2024-01-08 NOTE — H&P (Addendum)
 History and Physical    Patient: Ricardo Wood MVH:[id number] DOB: May 21, 1981 DOA: 01/08/2024 DOS: the patient was seen and examined on 01/08/2024 PCP: Aileen Alexanders, NP  Patient coming from: Home  Chief Complaint:  Chief Complaint  Patient presents with   Dizziness   HPI: Ricardo Wood is a 43 y.o. male with medical history significant of obesity, type 2 diabetes, history of DVT not on anticoagulation, schizophrenia presenting with sepsis, back pain, hyperglycemia.  Patient reports recurrent low back pain over the past 12 to 24 hours.  Back pain moderate to severe in intensity.  No radiation.  Has chronic lower extremity paresthesias in the setting of peripheral neuropathy associated with type 2 diabetes.  Does admit to doing some very heavy lifting over the past week or so.  Has had to unload heavy pallets at work when the past week.  No reported sick contacts.  Mild nausea.  Intermittent diarrhea which is chronic.  Mild chest pain and shortness of breath.  No focal hemiparesis or confusion.  No reported dysuria, increased urinary frequency. Does not check blood sugars on regular basis.  Baseline history of recurrent DVT.  Not on anticoagulation.  Currently followed by Dr. Randy Buttery.  Does admit to having some intermittent dark stools which is chronic.  Has not been on anticoagulation for multiple years. Presented to the ER Tmax one 1.5, heart rate 100s, BP stable.  Satting well on room air.  White count 8.3, hemoglobin 16.5, platelets 166, lactate 2-1.2.  COVID flu and RSV negative.  Creatinine 0.8.  Glucose 280s.  Chest x-ray stable.  CT abdomen pelvis grossly stable apart from right nephrolithiasis without hydronephrosis.  MRI T and L-spine negative for any acute infection though with noted moderate right and mild left L5 foraminal stenosis as well as degenerative changes from L1-L2 and L4-L5. Review of Systems: As mentioned in the history of present illness. All other systems reviewed and  are negative. Past Medical History:  Diagnosis Date   Bipolar 1 disorder (HCC)    Diabetes mellitus without complication (HCC)    DVT (deep venous thrombosis) (HCC)    Kidney stones    Schizophrenia (HCC)    Seizures (HCC)    Past Surgical History:  Procedure Laterality Date   dvt filter     Social History:  reports that he has quit smoking. He has never used smokeless tobacco. He reports current alcohol use. He reports that he does not currently use drugs after having used the following drugs: Marijuana.  Allergies  Allergen Reactions   Onion    Other Rash    Allergic to adhesive on fabric bandages    Family History  Problem Relation Age of Onset   Mental illness Mother    Aneurysm Mother    Skin cancer Mother    Cancer Maternal Aunt    Cancer Maternal Uncle     Prior to Admission medications   Medication Sig Start Date End Date Taking? Authorizing Provider  metFORMIN (GLUCOPHAGE) 500 MG tablet Take 1,000 mg by mouth 2 (two) times daily with a meal.   Yes [provider]  Blood Glucose Monitoring Suppl DEVI 1 each by Does not apply route in the morning, at noon, and at bedtime. May substitute to any manufacturer covered by patient's insurance. 01/05/24   Shane Darling, MD  budeson-glycopyrrolate-formoterol (BREZTRI AEROSPHERE) 160-9-4.8 MCG/ACT AERO Inhale 2 puffs into the lungs 2 (two) times daily. Patient not taking: Reported on 01/03/2024 12/30/23   Aileen Alexanders,  NP  ELIQUIS 5 MG TABS tablet Take 1 tablet (5 mg total) by mouth 2 (two) times daily. Patient not taking: Reported on 01/08/2024 12/24/23   Larae Grooms, NP  Glucose Blood (BLOOD GLUCOSE TEST STRIPS) STRP 1 each by In Vitro route in the morning, at noon, and at bedtime. May substitute to any manufacturer covered by patient's insurance. 01/05/24 02/04/24  Claybon Jabs, MD  Lancet Device MISC 1 each by Does not apply route in the morning, at noon, and at bedtime. May substitute to any manufacturer covered by  patient's insurance. 01/05/24 02/04/24  Claybon Jabs, MD  Lancets Misc. MISC 1 each by Does not apply route in the morning, at noon, and at bedtime. May substitute to any manufacturer covered by patient's insurance. 01/05/24 02/04/24  Claybon Jabs, MD  metFORMIN (GLUCOPHAGE-XR) 500 MG 24 hr tablet Take 2 tablets (1,000 mg total) by mouth 2 (two) times daily with a meal. Patient not taking: Reported on 01/08/2024 12/30/23   Larae Grooms, NP  methocarbamol (ROBAXIN) 500 MG tablet Take by mouth. Patient not taking: Reported on 01/04/2024 12/29/23   [provider]  olmesartan (BENICAR) 20 MG tablet Take 1 tablet (20 mg total) by mouth daily. Patient not taking: Reported on 01/03/2024 12/30/23   Larae Grooms, NP  rosuvastatin (CRESTOR) 20 MG tablet Take 1 tablet (20 mg total) by mouth daily. Patient not taking: Reported on 01/03/2024 12/30/23   Larae Grooms, NP  Semaglutide,0.25 or 0.5MG /DOS, (OZEMPIC, 0.25 OR 0.5 MG/DOSE,) 2 MG/1.5ML SOPN Inject 0.25 mg into the skin once a week. Start with 0.25MG  once a week x 4 weeks, then increase to 0.5MG  weekly. Patient not taking: Reported on 01/03/2024 12/30/23   Larae Grooms, NP    Physical Exam: Vitals:   01/08/24 0358 01/08/24 0507 01/08/24 0508 01/08/24 0510  BP:  125/80    Pulse:  (!) 102 100   Resp:      Temp: 99 F (37.2 C)   99.5 F (37.5 C)  TempSrc: Oral   Oral  SpO2:  97% 96%   Height:       Physical Exam Constitutional:      Appearance: He is obese.  HENT:     Head: Normocephalic and atraumatic.     Nose: Nose normal.     Mouth/Throat:     Mouth: Mucous membranes are moist.  Eyes:     Pupils: Pupils are equal, round, and reactive to light.  Cardiovascular:     Rate and Rhythm: Normal rate and regular rhythm.  Pulmonary:     Effort: Pulmonary effort is normal.  Abdominal:     General: Bowel sounds are normal.  Musculoskeletal:     Comments: Mild low back TTP   Skin:    General: Skin is warm.  Neurological:     General:  No focal deficit present.  Psychiatric:        Mood and Affect: Mood normal.     Data Reviewed:  There are no new results to review at this time.  MR Lumbar Spine W Wo Contrast CLINICAL DATA:  Initial evaluation for acute low back pain, infection suspected.  EXAM: MRI LUMBAR SPINE WITHOUT AND WITH CONTRAST  TECHNIQUE: Multiplanar and multiecho pulse sequences of the lumbar spine were obtained without and with intravenous contrast.  CONTRAST:  10mL GADAVIST GADOBUTROL 1 MMOL/ML IV SOLN  COMPARISON:  None Available.  FINDINGS: Segmentation: Standard. Lowest well-formed disc space labeled the L5-S1 level.  Alignment: Chronic bilateral pars  defects at L5 with associated 6 mm spondylolisthesis of L5 on S1. Trace stepwise degenerative retrolisthesis of L1 on L2 through L4 on L5.  Vertebrae: Vertebral body height maintained without acute or chronic fracture. Bone marrow signal intensity within normal limits. No worrisome osseous lesions. Reactive endplate change with mild marrow edema and enhancement about the L1-2 interspace noted, favored to be degenerative in nature. No other evidence for osteomyelitis discitis or septic arthritis within the lumbar spine.  Conus medullaris and cauda equina: Conus extends to the T12 level. Conus and cauda equina appear normal.  Paraspinal and other soft tissues: Unremarkable.  Disc levels:  L1-2: Degenerative intervertebral disc space narrowing with diffuse disc bulge and disc desiccation. Reactive endplate spurring. Mild bilateral facet hypertrophy. Resultant mild spinal stenosis. Foramina remain patent.  L2-3: Diffuse disc bulge with disc desiccation. Reactive endplate spurring. Mild bilateral facet hypertrophy. Mild epidural lipomatosis. Resultant mild-to-moderate spinal stenosis. Mild bilateral L2 foraminal narrowing.  L3-4: Mild diffuse disc bulge. Superimposed small central disc protrusion minimally indents the ventral  epidural fat. Mild facet hypertrophy. Epidural lipomatosis. Moderate spinal stenosis, largely due to epidural lipomatosis. Mild to moderate bilateral L3 foraminal narrowing.  L4-5: Minimal disc bulge. Mild facet spurring. Epidural lipomatosis. No significant spinal stenosis. Mild-to-moderate left greater than right L4 foraminal stenosis.  L5-S1: Chronic bilateral pars defects with 6 mm spondylolisthesis. Associated broad posterior pseudo disc bulge/uncovering. Moderate facet hypertrophy. Epidural lipomatosis. No spinal stenosis. Moderate right with mild left L5 foraminal stenosis.  IMPRESSION: 1. No MRI evidence for acute infection within the lumbar spine. 2. 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 3. Multifactorial degenerative changes at L1-2 through L4-5 with resultant mild to moderate diffuse spinal stenosis, with mild to moderate bilateral L2 through L4 foraminal narrowing as above.  Electronically Signed   By: Virgia Griffins M.D.   On: 01/08/2024 03:22 MR THORACIC SPINE W WO CONTRAST CLINICAL DATA:  Initial evaluation for mid back pain, neuro deficit, fever, evaluate epidural abscess.  EXAM: MRI THORACIC WITHOUT AND WITH CONTRAST  TECHNIQUE: Multiplanar and multiecho pulse sequences of the thoracic spine were obtained without and with intravenous contrast.  CONTRAST:  10mL GADAVIST GADOBUTROL 1 MMOL/ML IV SOLN  COMPARISON:  None Available.  FINDINGS: Alignment: Straightening of the normal thoracic kyphosis. Underlying trace dextroscoliosis. Trace anterolisthesis of T8 on T9.  Vertebrae: Vertebral body height maintained without acute or chronic fracture. Multiple scattered chronic endplate Schmorl's node deformities noted throughout the mid and lower thoracic spine. Bone marrow signal intensity within normal limits. No worrisome osseous lesions. No  evidence for osteomyelitis discitis or septic arthritis.  Cord: Normal signal and morphology. No epidural collections. No abnormal enhancement.  Paraspinal and other soft tissues: Unremarkable.  Disc levels:  T1-2: Disc desiccation with mild disc bulge and endplate spurring. No stenosis.  T2-3: Mild disc bulge with endplate spurring. Mild facet hypertrophy. No significant stenosis.  T3-4: Mild disc bulge with superimposed small right paracentral disc protrusion. Mild posterior element hypertrophy. No significant stenosis.  T4-5: Mild disc bulge with endplate spurring. Mild posterior element hypertrophy. No stenosis.  T5-6: Left paracentral disc protrusion flattens the ventral thecal sac. Mild cord flattening without cord signal changes or significant spinal stenosis. Foramina remain patent.  T6-7: Mild disc bulge. No spinal stenosis. Foramina remain patent.  T7-8: Mild disc bulge. Left greater than right facet hypertrophy. No spinal stenosis. Moderate left foraminal narrowing. Right neural foramen remains patent.  T8-9: Trace anterolisthesis. Disc desiccation with mild disc bulge. Moderate bilateral facet hypertrophy. No spinal stenosis. Moderate bilateral foraminal narrowing.  T9-10: Disc desiccation with mild disc bulge. Mild bilateral facet hypertrophy. No significant spinal stenosis. Foramina remain patent.  T10-11: Diffuse disc bulge, asymmetric to the right. Reactive endplate spurring. Right worse than left facet hypertrophy. No significant spinal stenosis. Foramina remain patent.  T11-12: Minimal disc bulge. Mild left-sided facet hypertrophy. No spinal stenosis. Mild left foraminal narrowing. Right neural foramen remains patent.  T12-L1:  Negative interspace.  No canal or foraminal stenosis.  IMPRESSION: 1. No MRI evidence for acute infection within the thoracic spine. 2. Multilevel thoracic spondylosis without significant spinal stenosis. Moderate left  foraminal narrowing at T7-8, with moderate bilateral foraminal narrowing at T8-9.  Electronically Signed   By: Virgia Griffins M.D.   On: 01/08/2024 03:14 CT ABDOMEN PELVIS W CONTRAST CLINICAL DATA:  Abdominal pain, fever  EXAM: CT ABDOMEN AND PELVIS WITH CONTRAST  TECHNIQUE: Multidetector CT imaging of the abdomen and pelvis was performed using the standard protocol following bolus administration of intravenous contrast.  RADIATION DOSE REDUCTION: This exam was performed according to the departmental dose-optimization program which includes automated exposure control, adjustment of the mA and/or kV according to patient size and/or use of iterative reconstruction technique.  CONTRAST:  OMNIPAQUE IOHEXOL 350 MG/ML SOLN  COMPARISON:  01/05/2024  FINDINGS: Lower chest: No acute abnormality  Hepatobiliary: Diffuse low-density throughout the liver compatible with fatty infiltration. No focal abnormality. Gallbladder unremarkable.  Pancreas: No focal abnormality or ductal dilatation.  Spleen: No focal abnormality.  Normal size.  Adrenals/Urinary Tract: 3 mm nonobstructing stones in the mid and lower pole of the right kidney. No renal stones on the left. No ureteral stones or hydronephrosis bilaterally. Adrenal glands and urinary bladder unremarkable.  Stomach/Bowel: Normal appendix. Stomach, large and small bowel grossly unremarkable.  Vascular/Lymphatic: No evidence of aneurysm or adenopathy. IVC filter in the infrarenal IVC.  Reproductive: No visible focal abnormality.  Other: No free fluid or free air.  Musculoskeletal: No acute bony abnormality.  IMPRESSION: No acute findings in the abdomen or pelvis.  Right nephrolithiasis.  No hydronephrosis.  Hepatic steatosis.  Electronically Signed   By: Janeece Mechanic M.D.   On: 01/08/2024 01:25 DG Chest Port 1 View CLINICAL DATA:  Questionable sepsis - evaluate for abnormality  EXAM: PORTABLE CHEST 1  VIEW  COMPARISON:  Chest x-ray 09/08/2023, CT chest 09/08/2023  FINDINGS: The heart and mediastinal contours are unchanged.  No focal consolidation. No pulmonary edema. No pleural effusion. No pneumothorax.  No acute osseous abnormality.  IMPRESSION: No active disease.  Electronically Signed   By: Morgane  Naveau M.D.   On: 01/08/2024 01:10  Lab Results  Component Value Date   WBC 8.3 01/08/2024   HGB 16.5 01/08/2024   HCT 48.1 01/08/2024   MCV 88.7 01/08/2024   PLT 166 01/08/2024   Last metabolic panel Lab Results  Component Value Date   GLUCOSE 286 (H) 01/08/2024   NA 133 (L) 01/08/2024   K 4.0 01/08/2024   CL 100 01/08/2024   CO2 22 01/08/2024   BUN 22 (H) 01/08/2024   CREATININE 0.82 01/08/2024   GFRNONAA >60 01/08/2024   CALCIUM 9.3 01/08/2024   PROT 7.6 01/08/2024   ALBUMIN 3.9 01/08/2024   LABGLOB 3.1 12/24/2023   BILITOT 0.6 01/08/2024   ALKPHOS 74 01/08/2024   AST 26 01/08/2024   ALT 40 01/08/2024   ANIONGAP 11 01/08/2024    Assessment and  Plan: * Sepsis (HCC) Meeting sepsis versus SIRS criteria with Tmax of 101.5, heart rate 100s White count within normal limits Lactate 2 Noted back pain though no overt source of infection including MRI of the T and L-spine negative discitis Will place on broad-spectrum antibiotic coverage Recent strenuous activity involving low back is a confounding issue  Panculture Trend lactate Monitor De-escalate as appropriate Follow-up  Low back pain Acute low back pain associated with recent strenuous activity with known overlapping sepsis versus SIRS MRI T and L-spine noted with lumbar stenosis changes at multilevel No overt infection noted concerning for discitis Recent strenuous activity associated with work or heavy lifting as a confounding issue Continue with broad-spectrum antibiotic coverage Pain control PT OT evaluation Monitor  Diabetes mellitus treated with insulin and oral medication (HCC) Blood  sugar to 80s SSI A1c Monitor  COPD (chronic obstructive pulmonary disease) (HCC) Fairly stable from a respiratory standpoint Continue home inhaler   Chronic deep vein thrombosis (DVT) (HCC) Noted chronic recurrent DVT Followed by Dr. Randy Buttery outpatient Has been intermittently compliant with anticoagulation Will place on prophylactic Lovenox for now pending outpatient follow-up Monitor      Advance Care Planning:   Code Status: Prior   Consults: None   Family Communication: No family at the bedside  Severity of Illness: The appropriate patient status for this patient is OBSERVATION. Observation status is judged to be reasonable and necessary in order to provide the required intensity of service to ensure the patient's safety. The patient's presenting symptoms, physical exam findings, and initial radiographic and laboratory data in the context of their medical condition is felt to place them at decreased risk for further clinical deterioration. Furthermore, it is anticipated that the patient will be medically stable for discharge from the hospital within 2 midnights of admission.   Author: Corrinne Din, MD 01/08/2024 7:27 AM  For on call review [email or web address].

## 2024-01-08 NOTE — Evaluation (Signed)
 Physical Therapy Evaluation Patient Details Name: Ricardo Wood MRN: [id number] DOB: October 15, 1980 Today's Date: 01/08/2024  History of Present Illness  43 y/o male presented to ED on 01/07/24 for fever and dizziness. Admitted for possible sepsis. PMH: T2DM, hx of DVT, schizophrenia, bipolar disorder  Clinical Impression  Patient admitted with the above. PTA, patient lives with wife and was independent and working. Patient functioning at supervision level for mobility with no AD. Complaining of dizziness at rest and with mobility. VSS on RA with diastolic elevated to 102. No further skilled PT needs identified acutely. PT will sign off.         Equipment Recommendations None recommended by PT  Recommendations for Other Services       Functional Status Assessment Patient has had a recent decline in their functional status and demonstrates the ability to make significant improvements in function in a reasonable and predictable amount of time.     Precautions / Restrictions Precautions Precautions: None Recall of Precautions/Restrictions: Intact Restrictions Weight Bearing Restrictions Per Provider Order: No      Mobility  Bed Mobility               General bed mobility comments: sitting on EOB on arrival    Transfers Overall transfer level: Needs assistance Equipment used: None Transfers: Sit to/from Stand Sit to Stand: Supervision                Ambulation/Gait Ambulation/Gait assistance: Supervision Gait Distance (Feet): 150 Feet Assistive device: None Gait Pattern/deviations: Step-through pattern, Decreased stride length Gait velocity: decreased     General Gait Details: supervision for safety. One instance of significant dizziness after ~100'. VSS stable with diastolic elevated to 102  Stairs            Wheelchair Mobility     Tilt Bed    Modified Rankin (Stroke Patients Only)       Balance Overall balance assessment: Mild deficits  observed, not formally tested                                           Pertinent Vitals/Pain Pain Assessment Pain Assessment: No/denies pain    Home Living Family/patient expects to be discharged to:: Private residence Living Arrangements: Spouse/significant other Available Help at Discharge: Family Type of Home: House Home Access: Stairs to enter Entrance Stairs-Rails: Right;Left;Can reach both Secretary/administrator of Steps: 3   Home Layout: One level Home Equipment: None      Prior Function Prior Level of Function : Independent/Modified Independent;Working/employed;Driving                     Extremity/Trunk Assessment   Upper Extremity Assessment Upper Extremity Assessment: Defer to OT evaluation    Lower Extremity Assessment Lower Extremity Assessment: Generalized weakness       Communication   Communication Communication: No apparent difficulties    Cognition Arousal: Alert Behavior During Therapy: WFL for tasks assessed/performed   PT - Cognitive impairments: No apparent impairments                         Following commands: Intact       Cueing       General Comments      Exercises     Assessment/Plan    PT Assessment Patient does not need any further PT services  PT Problem List         PT Treatment Interventions      PT Goals (Current goals can be found in the Care Plan section)  Acute Rehab PT Goals Patient Stated Goal: to feel better PT Goal Formulation: All assessment and education complete, DC therapy    Frequency       Co-evaluation               AM-PAC PT "6 Clicks" Mobility  Outcome Measure Help needed turning from your back to your side while in a flat bed without using bedrails?: None Help needed moving from lying on your back to sitting on the side of a flat bed without using bedrails?: None Help needed moving to and from a bed to a chair (including a wheelchair)?: A  Little Help needed standing up from a chair using your arms (e.g., wheelchair or bedside chair)?: A Little Help needed to walk in hospital room?: A Little Help needed climbing 3-5 steps with a railing? : A Little 6 Click Score: 20    End of Session   Activity Tolerance: Patient tolerated treatment well Patient left: in bed;with call bell/phone within reach Nurse Communication: Mobility status PT Visit Diagnosis: Muscle weakness (generalized) (M62.81)    Time: 1011-1030 PT Time Calculation (min) (ACUTE ONLY): 19 min   Charges:   PT Evaluation $PT Eval Low Complexity: 1 Low   PT General Charges $$ ACUTE PT VISIT: 1 Visit         Janine Melbourne, PT, DPT Physical Therapist - [facility]  [facility]   Charlsey Moragne A Ladarrell Cornwall 01/08/2024, 12:51 PM

## 2024-01-08 NOTE — ED Notes (Signed)
 Patient transported to CT

## 2024-01-08 NOTE — Sepsis Progress Note (Signed)
 Elink monitoring for the code sepsis protocol.

## 2024-01-08 NOTE — Assessment & Plan Note (Addendum)
 Meeting sepsis versus SIRS criteria with Tmax of 101.5, heart rate 100s White count within normal limits Lactate 2 Noted back pain though no overt source of infection including MRI of the T and L-spine negative discitis Will place on broad-spectrum antibiotic coverage Recent strenuous activity involving low back is a confounding issue  Panculture Trend lactate Monitor De-escalate as appropriate Follow-up

## 2024-01-08 NOTE — ED Notes (Signed)
 MD instructed RN to administer 3rd antibiotic Flagyl.

## 2024-01-08 NOTE — ED Notes (Signed)
 Pt provided food tray and something to drink per request and after verifying with Claria Crofts, MD if pt can have something to eat and drink.

## 2024-01-08 NOTE — Consult Note (Signed)
 NAME: Ricardo Wood  DOB: 17-Apr-1981  MRN: [id number]  Date/Time: 01/08/2024 5:41 PM  REQUESTING PROVIDER: Dr.Newton Subjective:  REASON FOR CONSULT: Sepsis/ EPEC ? Ricardo Wood is a 43 y.o. with a  history of Diabetes mellitis, recurrent lower extremity DVT, IVC filter ,renal stones presents with fever and back pain on 01/07/24 Pt came to the ED on 4/9 with back pain and got Ct and it showed rt non obstructing renal stone. He was sent home and he had fever of 102 yesterday He has no dysuria No hematuria He has intermittent diarrhea for 3 months now- he says since he started metformin He has no cough or sob He saw a new PCP on 12/24/23 HE was c/o dental abscess at 2 places on his upper teeth and he would press and express pus- His PCP gave amoxicillin 500mg  BID for 10 days which he completed on 4/7. The pain is back again and he expressed pus 2 days ago  HE saw heme /onc on 4/8 for recurrent DVT and multiple labs were done for that ( lupus anticoagulant, protein C, Protein S)  HE cam eot the ED yesterday because of fever, dizziness and not feeling well  01/07/24  BP 124/95 (H)  Temp 101.5 F (38.6 C) !  Pulse Rate 145 !  Resp 20  SpO2 98 %    Latest Reference Range & Units 01/08/24  WBC 4.0 - 10.5 K/uL 8.3  Hemoglobin 13.0 - 17.0 g/dL 78.2  HCT 95.6 - 21.3 % 48.1  Platelets 150 - 400 K/uL 166  Creatinine 0.61 - 1.24 mg/dL 0.86  MRI lumbar/thoracic spine was N Ct abdomen and pelvis showed 3 mm non obstructing stones in the mid and lower pole of the rt kidney. No hydronephrosis Hepatic steatosis HE was started on broad spectrum antibiotics vanco/cefepime and flagyl Stool was sent ( it was semisolid as per patient) and it showed EPEC and I am asked to see him Pt does not have loose watery stool Pt states he gets boils and will squeeze them No pets No travel   Past Medical History:  Diagnosis Date   Bipolar 1 disorder (HCC)    Diabetes mellitus without complication  (HCC)    DVT (deep venous thrombosis) (HCC)    Kidney stones    Schizophrenia (HCC)    Seizures (HCC)     Past Surgical History:  Procedure Laterality Date   dvt filter      Social History   Socioeconomic History   Marital status: Married    Spouse name: Not on file   Number of children: Not on file   Years of education: Not on file   Highest education level: Not on file  Occupational History   Not on file  Tobacco Use   Smoking status: Former   Smokeless tobacco: Never  Vaping Use   Vaping status: Never Used  Substance and Sexual Activity   Alcohol use: Yes    Comment: socially   Drug use: Not Currently    Types: Marijuana    Comment: last smoked about a week ago   Sexual activity: Yes  Other Topics Concern   Not on file  Social History Narrative   Not on file   Social Drivers of Health   Financial Resource Strain: High Risk (12/24/2023)   Overall Financial Resource Strain (CARDIA)    Difficulty of Paying Living Expenses: Hard  Food Insecurity: Food Insecurity Present (12/24/2023)   Hunger Vital Sign    Worried  About Running Out of Food in the Last Year: Sometimes true    Ran Out of Food in the Last Year: Sometimes true  Transportation Needs: No Transportation Needs (12/24/2023)   PRAPARE - Administrator, Civil Service (Medical): No    Lack of Transportation (Non-Medical): No  Physical Activity: Sufficiently Active (12/24/2023)   Exercise Vital Sign    Days of Exercise per Week: 6 days    Minutes of Exercise per Session: 60 min  Stress: Not on file  Social Connections: Socially Isolated (12/24/2023)   Social Connection and Isolation Panel [NHANES]    Frequency of Communication with Friends and Family: Once a week    Frequency of Social Gatherings with Friends and Family: Never    Attends Religious Services: Never    Database administrator or Organizations: No    Attends Banker Meetings: Never    Marital Status: Married  Careers information officer Violence: Not At Risk (12/24/2023)   Humiliation, Afraid, Rape, and Kick questionnaire    Fear of Current or Ex-Partner: No    Emotionally Abused: No    Physically Abused: No    Sexually Abused: No    Family History  Problem Relation Age of Onset   Mental illness Mother    Aneurysm Mother    Skin cancer Mother    Cancer Maternal Aunt    Cancer Maternal Uncle    Allergies  Allergen Reactions   Onion    Vancomycin Hives and Itching   Other Rash    Allergic to adhesive on fabric bandages   I? Current Facility-Administered Medications  Medication Dose Route Frequency Provider Last Rate Last Admin   acetaminophen (TYLENOL) tablet 650 mg  650 mg Oral Q6H PRN Newton, Steven J, MD       azithromycin (ZITHROMAX) 500 mg in sodium chloride 0.9 % 250 mL IVPB  500 mg Intravenous Q24H Newton, Steven J, MD 250 mL/hr at 01/08/24 1642 500 mg at 01/08/24 1642   enoxaparin (LOVENOX) injection 70 mg  0.5 mg/kg Subcutaneous Q24H Corrinne Din, MD   70 mg at 01/08/24 1104   insulin aspart (novoLOG) injection 0-15 Units  0-15 Units Subcutaneous TID WC Corrinne Din, MD   11 Units at 01/08/24 1457   insulin aspart (novoLOG) injection 4 Units  4 Units Subcutaneous TID WC Corrinne Din, MD   4 Units at 01/08/24 1457   lactated ringers infusion  150 mL/hr Intravenous Continuous Corrinne Din, MD 150 mL/hr at 01/08/24 1636 150 mL/hr at 01/08/24 1636   ondansetron (ZOFRAN) tablet 4 mg  4 mg Oral Q6H PRN Newton, Steven J, MD       Or   ondansetron (ZOFRAN) injection 4 mg  4 mg Intravenous Q6H PRN Newton, Steven J, MD       Current Outpatient Medications  Medication Sig Dispense Refill   metFORMIN (GLUCOPHAGE) 500 MG tablet Take 1,000 mg by mouth 2 (two) times daily with a meal.     Blood Glucose Monitoring Suppl DEVI 1 each by Does not apply route in the morning, at noon, and at bedtime. May substitute to any manufacturer covered by patient's insurance. 1 each 0    budeson-glycopyrrolate-formoterol (BREZTRI AEROSPHERE) 160-9-4.8 MCG/ACT AERO Inhale 2 puffs into the lungs 2 (two) times daily. (Patient not taking: Reported on 01/03/2024) 10.7 g 0   ELIQUIS 5 MG TABS tablet Take 1 tablet (5 mg total) by mouth 2 (two) times daily. (Patient not taking: Reported  on 01/08/2024) 60 tablet 0   Glucose Blood (BLOOD GLUCOSE TEST STRIPS) STRP 1 each by In Vitro route in the morning, at noon, and at bedtime. May substitute to any manufacturer covered by patient's insurance. 90 each 0   Lancet Device MISC 1 each by Does not apply route in the morning, at noon, and at bedtime. May substitute to any manufacturer covered by patient's insurance. 1 each 0   Lancets Misc. MISC 1 each by Does not apply route in the morning, at noon, and at bedtime. May substitute to any manufacturer covered by patient's insurance. 100 each 0   metFORMIN (GLUCOPHAGE-XR) 500 MG 24 hr tablet Take 2 tablets (1,000 mg total) by mouth 2 (two) times daily with a meal. (Patient not taking: Reported on 01/08/2024) 180 tablet 0   methocarbamol (ROBAXIN) 500 MG tablet Take by mouth. (Patient not taking: Reported on 01/04/2024)     olmesartan (BENICAR) 20 MG tablet Take 1 tablet (20 mg total) by mouth daily. (Patient not taking: Reported on 01/03/2024) 90 tablet 0   rosuvastatin (CRESTOR) 20 MG tablet Take 1 tablet (20 mg total) by mouth daily. (Patient not taking: Reported on 01/03/2024) 90 tablet 0   Semaglutide,0.25 or 0.5MG /DOS, (OZEMPIC, 0.25 OR 0.5 MG/DOSE,) 2 MG/1.5ML SOPN Inject 0.25 mg into the skin once a week. Start with 0.25MG  once a week x 4 weeks, then increase to 0.5MG  weekly. (Patient not taking: Reported on 01/03/2024) 6 mL 0     Abtx:  Anti-infectives (From admission, onward)    Start     Dose/Rate Route Frequency Ordered Stop   01/08/24 2200  vancomycin (VANCOREADY) IVPB 2000 mg/400 mL  Status:  Discontinued        2,000 mg 200 mL/hr over 120 Minutes Intravenous Every 12 hours 01/08/24 0854 01/08/24  1643   01/08/24 1530  azithromycin (ZITHROMAX) 500 mg in sodium chloride 0.9 % 250 mL IVPB        500 mg 250 mL/hr over 60 Minutes Intravenous Every 24 hours 01/08/24 1519     01/08/24 1000  metroNIDAZOLE (FLAGYL) IVPB 500 mg  Status:  Discontinued        500 mg 100 mL/hr over 60 Minutes Intravenous Every 12 hours 01/08/24 0827 01/08/24 1519   01/08/24 0900  ceFEPIme (MAXIPIME) 2 g in sodium chloride 0.9 % 100 mL IVPB  Status:  Discontinued        2 g 200 mL/hr over 30 Minutes Intravenous Every 8 hours 01/08/24 0839 01/08/24 1643   01/08/24 0900  vancomycin (VANCOREADY) IVPB 1500 mg/300 mL       Placed in "And" Linked Group   1,500 mg 150 mL/hr over 120 Minutes Intravenous  Once 01/08/24 0854 01/08/24 1041   01/08/24 0900  vancomycin (VANCOCIN) IVPB 1000 mg/200 mL premix       Placed in "And" Linked Group   1,000 mg 200 mL/hr over 60 Minutes Intravenous  Once 01/08/24 0854 01/08/24 1032   01/08/24 0100  cefTRIAXone (ROCEPHIN) 2 g in sodium chloride 0.9 % 100 mL IVPB        2 g 200 mL/hr over 30 Minutes Intravenous Once 01/08/24 0045 01/08/24 0249   01/08/24 0100  metroNIDAZOLE (FLAGYL) IVPB 500 mg        500 mg 100 mL/hr over 60 Minutes Intravenous  Once 01/08/24 0045 01/08/24 0319       REVIEW OF SYSTEMS:  Const:  fever,  chills, negative weight loss Eyes: negative diplopia or visual changes, negative eye  pain ENT: negative coryza, negative sore throat Pain mutiple teet, pain left maxillary area Resp: negative cough, hemoptysis, dyspnea Cards: negative for chest pain, palpitations, lower extremity edema GU: negative for frequency, dysuria and hematuria GI: Negative for abdominal pain, diarrhea, bleeding, constipation Skin: small boils over perineal area , no new lesions now Heme: negative for easy bruising and gum/nose bleeding ZO:XWRU pain and muscle weakness Rt shoulder pain Neurolo: dizziness,  Psych:  anxiety, depression  Endocrine: diabetes Allergy/Immunology- vanco  today caused itching Objective:  VITALS:  BP 124/85 (BP Location: Left Arm)   Pulse 84   Temp 98.8 F (37.1 C) (Oral)   Resp 18   Ht 6' (1.829 m)   SpO2 96%   BMI 41.91 kg/m   PHYSICAL EXAM:  General: Alert, cooperative, flushed face, high BMI Head: Normocephalic, without obvious abnormality, atraumatic. Eyes: Conjunctivae clear, anicteric sclerae. Pupils are equal ENT Nares normal. No drainage or sinus tenderness. Lips, mucosa, and tongue normal. No Thrush Poor dentition, multiple broken teeth- tenderness over the left upper incisor and tenderness over the alveolar area, tenderness over left maxillary area Neck: Supple, symmetrical, cervical  adenopathy more prominent on the left, thyroid: non tender no carotid bruit and no JVD. Back: No CVA tenderness. Lungs: Clear to auscultation bilaterally. No Wheezing or Rhonchi. No rales. Heart: Regular rate and rhythm, no murmur, rub or gallop. Abdomen: Soft, non-tender,not distended. Bowel sounds normal. No masses Extremities: spider veins and varicose veins rt > left  Skin: multiple scars over the perineum- no new painful or tender lesions Scrotum no swelling or tenderness Neurologic: Grossly non-focal Pertinent Labs Lab Results CBC    Component Value Date/Time   WBC 8.3 01/08/2024 0009   RBC 5.42 01/08/2024 0009   HGB 16.5 01/08/2024 0009   HGB 15.8 10/19/2012 1051   HCT 48.1 01/08/2024 0009   HCT 46.3 10/19/2012 1051   PLT 166 01/08/2024 0009   PLT 186 10/19/2012 1051   MCV 88.7 01/08/2024 0009   MCV 92 10/19/2012 1051   MCH 30.4 01/08/2024 0009   MCHC 34.3 01/08/2024 0009   RDW 12.3 01/08/2024 0009   RDW 13.8 10/19/2012 1051   LYMPHSABS 4.1 (H) 01/05/2024 1828   LYMPHSABS 3.7 (H) 06/20/2012 1534   MONOABS 0.5 01/05/2024 1828   MONOABS 0.7 06/20/2012 1534   EOSABS 0.2 01/05/2024 1828   EOSABS 0.3 06/20/2012 1534   BASOSABS 0.1 01/05/2024 1828   BASOSABS 0.1 06/20/2012 1534       Latest Ref Rng & Units 01/08/2024    12:09 AM 01/05/2024    6:28 PM 12/24/2023    9:38 AM  CMP  Glucose 70 - 99 mg/dL 045  409  811   BUN 6 - 20 mg/dL 22  13  13    Creatinine 0.61 - 1.24 mg/dL 9.14  7.82  9.56   Sodium 135 - 145 mmol/L 133  128  138   Potassium 3.5 - 5.1 mmol/L 4.0  4.3  4.0   Chloride 98 - 111 mmol/L 100  93  99   CO2 22 - 32 mmol/L 22  26  24    Calcium 8.9 - 10.3 mg/dL 9.3  9.0  9.1   Total Protein 6.5 - 8.1 g/dL 7.6  7.0  7.4   Total Bilirubin 0.0 - 1.2 mg/dL 0.6  0.6  0.3   Alkaline Phos 38 - 126 U/L 74  74  108   AST 15 - 41 U/L 26  27  20    ALT 0 -  44 U/L 40  39  32       Microbiology: Recent Results (from the past 240 hours)  Resp panel by RT-PCR (RSV, Flu A&B, Covid) Anterior Nasal Swab     Status: None   Collection Time: 01/05/24  9:44 PM   Specimen: Anterior Nasal Swab  Result Value Ref Range Status   SARS Coronavirus 2 by RT PCR NEGATIVE NEGATIVE Final    Comment: (NOTE) SARS-CoV-2 target nucleic acids are NOT DETECTED.  The SARS-CoV-2 RNA is generally detectable in upper respiratory specimens during the acute phase of infection. The lowest concentration of SARS-CoV-2 viral copies this assay can detect is 138 copies/mL. A negative result does not preclude SARS-Cov-2 infection and should not be used as the sole basis for treatment or other patient management decisions. A negative result may occur with  improper specimen collection/handling, submission of specimen other than nasopharyngeal swab, presence of viral mutation(s) within the areas targeted by this assay, and inadequate number of viral copies(<138 copies/mL). A negative result must be combined with clinical observations, patient history, and epidemiological information. The expected result is Negative.  Fact Sheet for Patients:  BloggerCourse.com  Fact Sheet for Healthcare Providers:  SeriousBroker.it  This test is no t yet approved or cleared by the [place]  FDA  and  has been authorized for detection and/or diagnosis of SARS-CoV-2 by FDA under an Emergency Use Authorization (EUA). This EUA will remain  in effect (meaning this test can be used) for the duration of the COVID-19 declaration under Section 564(b)(1) of the Act, 21 U.S.C.section 360bbb-3(b)(1), unless the authorization is terminated  or revoked sooner.       Influenza A by PCR NEGATIVE NEGATIVE Final   Influenza B by PCR NEGATIVE NEGATIVE Final    Comment: (NOTE) The Xpert Xpress SARS-CoV-2/FLU/RSV plus assay is intended as an aid in the diagnosis of influenza from Nasopharyngeal swab specimens and should not be used as a sole basis for treatment. Nasal washings and aspirates are unacceptable for Xpert Xpress SARS-CoV-2/FLU/RSV testing.  Fact Sheet for Patients: BloggerCourse.com  Fact Sheet for Healthcare Providers: SeriousBroker.it  This test is not yet approved or cleared by the [place]  FDA and has been authorized for detection and/or diagnosis of SARS-CoV-2 by FDA under an Emergency Use Authorization (EUA). This EUA will remain in effect (meaning this test can be used) for the duration of the COVID-19 declaration under Section 564(b)(1) of the Act, 21 U.S.C. section 360bbb-3(b)(1), unless the authorization is terminated or revoked.     Resp Syncytial Virus by PCR NEGATIVE NEGATIVE Final    Comment: (NOTE) Fact Sheet for Patients: BloggerCourse.com  Fact Sheet for Healthcare Providers: SeriousBroker.it  This test is not yet approved or cleared by the [place]  FDA and has been authorized for detection and/or diagnosis of SARS-CoV-2 by FDA under an Emergency Use Authorization (EUA). This EUA will remain in effect (meaning this test can be used) for the duration of the COVID-19 declaration under Section 564(b)(1) of the Act, 21 U.S.C. section 360bbb-3(b)(1),  unless the authorization is terminated or revoked.  Performed at [facility], [address] [place], [place] [address]   Resp panel by RT-PCR (RSV, Flu A&B, Covid) Anterior Nasal Swab     Status: None   Collection Time: 01/08/24 12:45 AM   Specimen: Anterior Nasal Swab  Result Value Ref Range Status   SARS Coronavirus 2 by RT PCR NEGATIVE NEGATIVE Final    Comment: (NOTE) SARS-CoV-2 target nucleic acids are NOT  DETECTED.  The SARS-CoV-2 RNA is generally detectable in upper respiratory specimens during the acute phase of infection. The lowest concentration of SARS-CoV-2 viral copies this assay can detect is 138 copies/mL. A negative result does not preclude SARS-Cov-2 infection and should not be used as the sole basis for treatment or other patient management decisions. A negative result may occur with  improper specimen collection/handling, submission of specimen other than nasopharyngeal swab, presence of viral mutation(s) within the areas targeted by this assay, and inadequate number of viral copies(<138 copies/mL). A negative result must be combined with clinical observations, patient history, and epidemiological information. The expected result is Negative.  Fact Sheet for Patients:  BloggerCourse.com  Fact Sheet for Healthcare Providers:  SeriousBroker.it  This test is no t yet approved or cleared by the [place]  FDA and  has been authorized for detection and/or diagnosis of SARS-CoV-2 by FDA under an Emergency Use Authorization (EUA). This EUA will remain  in effect (meaning this test can be used) for the duration of the COVID-19 declaration under Section 564(b)(1) of the Act, 21 U.S.C.section 360bbb-3(b)(1), unless the authorization is terminated  or revoked sooner.       Influenza A by PCR NEGATIVE NEGATIVE Final   Influenza B by PCR NEGATIVE NEGATIVE Final    Comment: (NOTE) The Xpert Xpress  SARS-CoV-2/FLU/RSV plus assay is intended as an aid in the diagnosis of influenza from Nasopharyngeal swab specimens and should not be used as a sole basis for treatment. Nasal washings and aspirates are unacceptable for Xpert Xpress SARS-CoV-2/FLU/RSV testing.  Fact Sheet for Patients: BloggerCourse.com  Fact Sheet for Healthcare Providers: SeriousBroker.it  This test is not yet approved or cleared by the [place]  FDA and has been authorized for detection and/or diagnosis of SARS-CoV-2 by FDA under an Emergency Use Authorization (EUA). This EUA will remain in effect (meaning this test can be used) for the duration of the COVID-19 declaration under Section 564(b)(1) of the Act, 21 U.S.C. section 360bbb-3(b)(1), unless the authorization is terminated or revoked.     Resp Syncytial Virus by PCR NEGATIVE NEGATIVE Final    Comment: (NOTE) Fact Sheet for Patients: BloggerCourse.com  Fact Sheet for Healthcare Providers: SeriousBroker.it  This test is not yet approved or cleared by the [place]  FDA and has been authorized for detection and/or diagnosis of SARS-CoV-2 by FDA under an Emergency Use Authorization (EUA). This EUA will remain in effect (meaning this test can be used) for the duration of the COVID-19 declaration under Section 564(b)(1) of the Act, 21 U.S.C. section 360bbb-3(b)(1), unless the authorization is terminated or revoked.  Performed at [facility], [address] [place], [place] [address]   Blood Culture (routine x 2)     Status: None (Preliminary result)   Collection Time: 01/08/24 12:45 AM   Specimen: BLOOD  Result Value Ref Range Status   Specimen Description BLOOD RIGHT ANTECUBITAL  Final   Special Requests   Final    BOTTLES DRAWN AEROBIC AND ANAEROBIC Blood Culture results may not be optimal due to an inadequate volume of blood  received in culture bottles   Culture   Final    NO GROWTH < 12 HOURS Performed at [facility], [address], [place], [place] [address]    Report Status PENDING  Incomplete  Blood Culture (routine x 2)     Status: None (Preliminary result)   Collection Time: 01/08/24 12:50 AM   Specimen: BLOOD  Result Value Ref Range Status  Specimen Description BLOOD LEFT ANTECUBITAL  Final   Special Requests   Final    BOTTLES DRAWN AEROBIC AND ANAEROBIC Blood Culture results may not be optimal due to an inadequate volume of blood received in culture bottles   Culture   Final    NO GROWTH < 12 HOURS Performed at [facility], [address] [place], [place] [address]    Report Status PENDING  Incomplete  Respiratory (~20 pathogens) panel by PCR     Status: None   Collection Time: 01/08/24  7:48 AM   Specimen: Nasopharyngeal Swab; Respiratory  Result Value Ref Range Status   Adenovirus NOT DETECTED NOT DETECTED Final   Coronavirus 229E NOT DETECTED NOT DETECTED Final    Comment: (NOTE) The Coronavirus on the Respiratory Panel, DOES NOT test for the novel  Coronavirus (2019 nCoV)    Coronavirus HKU1 NOT DETECTED NOT DETECTED Final   Coronavirus NL63 NOT DETECTED NOT DETECTED Final   Coronavirus OC43 NOT DETECTED NOT DETECTED Final   Metapneumovirus NOT DETECTED NOT DETECTED Final   Rhinovirus / Enterovirus NOT DETECTED NOT DETECTED Final   Influenza A NOT DETECTED NOT DETECTED Final   Influenza B NOT DETECTED NOT DETECTED Final   Parainfluenza Virus 1 NOT DETECTED NOT DETECTED Final   Parainfluenza Virus 2 NOT DETECTED NOT DETECTED Final   Parainfluenza Virus 3 NOT DETECTED NOT DETECTED Final   Parainfluenza Virus 4 NOT DETECTED NOT DETECTED Final   Respiratory Syncytial Virus NOT DETECTED NOT DETECTED Final   Bordetella pertussis NOT DETECTED NOT DETECTED Final   Bordetella Parapertussis NOT DETECTED NOT DETECTED Final   Chlamydophila pneumoniae NOT DETECTED NOT  DETECTED Final   Mycoplasma pneumoniae NOT DETECTED NOT DETECTED Final    Comment: Performed at [facility], [address] [place], [place] [address]  Gastrointestinal Panel by PCR , Stool     Status: Abnormal   Collection Time: 01/08/24 11:29 AM   Specimen: Stool  Result Value Ref Range Status   Campylobacter species NOT DETECTED NOT DETECTED Final   Plesimonas shigelloides NOT DETECTED NOT DETECTED Final   Salmonella species NOT DETECTED NOT DETECTED Final   Yersinia enterocolitica NOT DETECTED NOT DETECTED Final   Vibrio species NOT DETECTED NOT DETECTED Final   Vibrio cholerae NOT DETECTED NOT DETECTED Final   Enteroaggregative E coli (EAEC) NOT DETECTED NOT DETECTED Final   Enteropathogenic E coli (EPEC) DETECTED (A) NOT DETECTED Final    Comment: RESULT CALLED TO, READ BACK BY AND VERIFIED WITH: DEVYN TAYLOR/NURSE @ [id number] [id number] 25 BY SKY    Enterotoxigenic E coli (ETEC) NOT DETECTED NOT DETECTED Final   Shiga like toxin producing E coli (STEC) NOT DETECTED NOT DETECTED Final   Shigella/Enteroinvasive E coli (EIEC) NOT DETECTED NOT DETECTED Final   Cryptosporidium NOT DETECTED NOT DETECTED Final   Cyclospora cayetanensis NOT DETECTED NOT DETECTED Final   Entamoeba histolytica NOT DETECTED NOT DETECTED Final   Giardia lamblia NOT DETECTED NOT DETECTED Final   Adenovirus F40/41 NOT DETECTED NOT DETECTED Final   Astrovirus NOT DETECTED NOT DETECTED Final   Norovirus GI/GII NOT DETECTED NOT DETECTED Final   Rotavirus A NOT DETECTED NOT DETECTED Final   Sapovirus (I, II, IV, and V) NOT DETECTED NOT DETECTED Final    Comment: Performed at [facility], [address] [place], [place] [address]    IMAGING RESULTS: MRI thoracic and lumbar spine Degenrative changes No discitis or osteo  CXR no infiltrate CT abdomen as above  I have personally reviewed the films ? Impression/Recommendation Fever -No leucocytosis, normal LFT/renal function, normal  imaging D.D  pt has dental abscess and recently taken amoxicillin Recommend imaging  Rt renal cacluli non obstructive and urine has no wbc- so unlikely the reason for fever  Enteropathogenic ecoli in stool is likely not the cause of his fever The tested stool was semisolid  He has had intermittent diarrhea since ebing on metformin for 3 months and recently the PCP changed to XR There eare no active skin and soft tissue infection like abscess ( thoug pripor scars of old boils seen over perineum, axillary area) No fournier's No cellulitis  He is at risk for all of the above due to poorl controlled diabetes Observe closely Will do ceftriaxone and flagyl to treat the dental abscess HE needs extraction of teeth as OP  Rt shoulder pain- could be rotator cuff involvement-   Poorly controlled DM On insulin here  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 Discussed the management with patient, nurse and Dr.Newton

## 2024-01-08 NOTE — ED Notes (Signed)
 Pt back from MRI

## 2024-01-08 NOTE — ED Notes (Signed)
 Patient sitting on side of the bed eating breakfast. OT and PT in room to work with patient.

## 2024-01-08 NOTE — ED Notes (Signed)
 Called pharmacy to verify running both vancomycin medications at the same time. Pharmacy stated this was okay as long as it was in two different IV's.

## 2024-01-09 DIAGNOSIS — A419 Sepsis, unspecified organism: Secondary | ICD-10-CM | POA: Diagnosis not present

## 2024-01-09 LAB — COMPREHENSIVE METABOLIC PANEL WITH GFR
ALT: 33 U/L (ref 0–44)
AST: 23 U/L (ref 15–41)
Albumin: 3.2 g/dL — ABNORMAL LOW (ref 3.5–5.0)
Alkaline Phosphatase: 63 U/L (ref 38–126)
Anion gap: 6 (ref 5–15)
BUN: 12 mg/dL (ref 6–20)
CO2: 27 mmol/L (ref 22–32)
Calcium: 8.4 mg/dL — ABNORMAL LOW (ref 8.9–10.3)
Chloride: 101 mmol/L (ref 98–111)
Creatinine, Ser: 0.7 mg/dL (ref 0.61–1.24)
GFR, Estimated: >60 mL/min (ref >60)
Glucose, Bld: 282 mg/dL — ABNORMAL HIGH (ref 70–99)
Potassium: 3.9 mmol/L (ref 3.5–5.1)
Sodium: 134 mmol/L — ABNORMAL LOW (ref 135–145)
Total Bilirubin: 0.6 mg/dL (ref 0.0–1.2)
Total Protein: 6.4 g/dL — ABNORMAL LOW (ref 6.5–8.1)

## 2024-01-09 LAB — GLUCOSE, CAPILLARY
Glucose-Capillary: 358 mg/dL — ABNORMAL HIGH (ref 70–99)
Glucose-Capillary: 232 mg/dL — ABNORMAL HIGH (ref 70–99)

## 2024-01-09 LAB — CBC
HCT: 44.1 % (ref 39.0–52.0)
Hemoglobin: 15 g/dL (ref 13.0–17.0)
MCH: 30.3 pg (ref 26.0–34.0)
MCHC: 34 g/dL (ref 30.0–36.0)
MCV: 89.1 fL (ref 80.0–100.0)
Platelets: 131 10*3/uL — ABNORMAL LOW (ref 150–400)
RBC: 4.95 MIL/uL (ref 4.22–5.81)
RDW: 12.4 % (ref 11.5–15.5)
WBC: 6.7 10*3/uL (ref 4.0–10.5)
nRBC: 0 % (ref 0.0–0.2)

## 2024-01-09 LAB — CBG MONITORING, ED
Glucose-Capillary: 260 mg/dL — ABNORMAL HIGH (ref 70–99)
Glucose-Capillary: 295 mg/dL — ABNORMAL HIGH (ref 70–99)

## 2024-01-09 MED ORDER — INSULIN GLARGINE-YFGN 100 UNIT/ML ~~LOC~~ SOLN
30.0000 [IU] | Freq: Every day | SUBCUTANEOUS | Status: DC
  Administered 2024-01-09: 30 [IU] via SUBCUTANEOUS
  Filled 2024-01-09 (×2): qty 0.3

## 2024-01-09 MED ORDER — LIDOCAINE 5 % EX PTCH
1.0000 | MEDICATED_PATCH | CUTANEOUS | Status: DC
  Administered 2024-01-09 – 2024-01-10 (×2): 1 via TRANSDERMAL
  Filled 2024-01-09 (×3): qty 1

## 2024-01-09 MED ORDER — CYCLOBENZAPRINE HCL 10 MG PO TABS: 5.0000 mg | ORAL_TABLET | Freq: Three times a day (TID) | ORAL | Status: DC | PRN

## 2024-01-09 MED ORDER — DIPHENHYDRAMINE HCL 25 MG PO CAPS
25.0000 mg | ORAL_CAPSULE | Freq: Once | ORAL | Status: AC
  Administered 2024-01-09: 25 mg via ORAL
  Filled 2024-01-09: qty 1

## 2024-01-09 NOTE — ED Notes (Signed)
Pt resting comfortably in bed at this time. Pt is alert and oriented with even and regular respirations. No acute distress noted. Pt denies any needs at this time. Call light within reach.  °

## 2024-01-09 NOTE — ED Notes (Signed)
 Advised nurse that patient has ready bed

## 2024-01-09 NOTE — ED Notes (Signed)
 Phlebotomy called to draw labs due to pt being difficult stick. Lab aware.

## 2024-01-09 NOTE — Progress Notes (Signed)
 Progress Note   Patient: Ricardo Wood ZOX:[id number] DOB: [birth year] DOA: 01/08/2024     0 DOS: the patient was seen and examined on 01/09/2024   Brief hospital course:  Ricardo Wood is a 43 y.o. male with medical history significant of obesity, type 2 diabetes, history of DVT not on anticoagulation, schizophrenia presenting with sepsis, back pain, hyperglycemia.  Patient reports recurrent low back pain over the past 12 to 24 hours.  Back pain moderate to severe in intensity.  No radiation.  Has chronic lower extremity paresthesias in the setting of peripheral neuropathy associated with type 2 diabetes.  Does admit to doing some very heavy lifting over the past week or so.  Has had to unload heavy pallets at work when the past week.  No reported sick contacts.  Mild nausea.  Intermittent diarrhea which is chronic.  Mild chest pain and shortness of breath.  No focal hemiparesis or confusion.  No reported dysuria, increased urinary frequency. Does not check blood sugars on regular basis.  Baseline history of recurrent DVT.  Not on anticoagulation.  Currently followed by Dr. Randy Buttery.  Does admit to having some intermittent dark stools which is chronic.  Has not been on anticoagulation for multiple years. Presented to the ER Tmax one 1.5, heart rate 100s, BP stable.  Satting well on room air.  White count 8.3, hemoglobin 16.5, platelets 166, lactate 2-1.2.  COVID flu and RSV negative.  Creatinine 0.8.  Glucose 280s.  Chest x-ray stable.  CT abdomen pelvis grossly stable apart from right nephrolithiasis without hydronephrosis.  MRI T and L-spine negative for any acute infection though with noted moderate right and mild left L5 foraminal stenosis as well as degenerative changes from L1-L2 and L4-L5. Review of Systems: As mentioned in the history of present illness. All other systems reviewed and are negative.     Assessment and Plan:   * SIRS without any source of infection Low back pain Patient  had a fever on admission with a Tmax of 101.5 and was tachycardic with heart rate in the 100.  Lactate was elevated at 2 but normal white cell count but no obvious source of infection.  Lactic acidosis may be related to metformin use Patient with complaints of back pain concerning for discitis.  Imaging was negative for discitis. Follow up results of blood cultures. MRI of the thoracic spine shows no MRI evidence for acute infection within the thoracic spine. Multilevel thoracic spondylosis without significant spinal stenosis. Moderate left foraminal narrowing at T7-8, with moderate bilateral foraminal narrowing at T8-9. MRI of the lumbar spine shows no MRI evidence for acute infection within the lumbar spine. 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 Multifactorial degenerative changes at L1-2 through L4-5 with resultant mild to moderate diffuse spinal stenosis, with mild to moderate bilateral L2 through L4 foraminal narrowing as above. Worsening low back pain appears to be musculoskeletal and related to strenuous activity, patient does a lot of heavy lifting at work Pain control Muscle relaxants       Diabetes mellitus with hyperglycemia Hemoglobin A1C is 12.3 Will start patient on Levemir 30 units Continue sliding scale coverage Patient will need to be discharged home on insulin     COPD (chronic obstructive pulmonary disease) (HCC) Fairly stable from a respiratory standpoint Continue home inhaler     Chronic deep vein thrombosis (DVT) (HCC) Noted chronic recurrent DVT Followed by Dr.  Randy Buttery outpatient Has been intermittently compliant with anticoagulation Will place on prophylactic Lovenox for now pending outpatient follow-up Monitor    Morbid Obesity (BMI 41.77 kg/m2) Complicates overall prognosis and care Lifestyle modification and exercise has been discussed with patient in  detail     Subjective: No further episodes of fever.  Complains of back pain.  Physical Exam: Vitals:   01/09/24 0831 01/09/24 0835 01/09/24 1058 01/09/24 1220  BP:  110/74  123/79  Pulse:  92  85  Resp:  14  16  Temp: 98.6 F (37 C)  98.6 F (37 C) 98.5 F (36.9 C)  TempSrc: Oral  Oral Oral  SpO2:  94%  98%  Weight: (!) 139.7 kg     Height: 6' (1.829 m)      Constitutional:      Appearance: He is obese.  HENT:     Head: Normocephalic and atraumatic.     Nose: Nose normal.     Mouth/Throat:     Mouth: Mucous membranes are moist.  Eyes:     Pupils: Pupils are equal, round, and reactive to light.  Cardiovascular:     Rate and Rhythm: Normal rate and regular rhythm.  Pulmonary:     Effort: Pulmonary effort is normal.  Abdominal:     General: Bowel sounds are normal.  Musculoskeletal:     Comments: Mild low back TTP   Skin:    General: Skin is warm.  Neurological:     General: No focal deficit present.  Psychiatric:        Mood and Affect: Mood normal.      Data Reviewed: Sodium 134, glucose 282 Labs reviewed  Family Communication: Plan of care was discussed with patient in detail.  He verbalizes understanding and agrees with the plan.  Disposition: Status is: Observation The patient remains OBS appropriate and will d/c before 2 midnights.  Planned Discharge Destination: Home    Time spent: 38 minutes  Author: Read Camel, MD 01/09/2024 2:03 PM  For on call review [email or web address].

## 2024-01-10 ENCOUNTER — Telehealth (HOSPITAL_COMMUNITY): Payer: Self-pay | Admitting: Pharmacy Technician

## 2024-01-10 ENCOUNTER — Other Ambulatory Visit (HOSPITAL_COMMUNITY): Payer: Self-pay

## 2024-01-10 DIAGNOSIS — G8929 Other chronic pain: Secondary | ICD-10-CM | POA: Diagnosis not present

## 2024-01-10 DIAGNOSIS — R509 Fever, unspecified: Secondary | ICD-10-CM

## 2024-01-10 DIAGNOSIS — J449 Chronic obstructive pulmonary disease, unspecified: Secondary | ICD-10-CM | POA: Diagnosis not present

## 2024-01-10 DIAGNOSIS — B9629 Other Escherichia coli [E. coli] as the cause of diseases classified elsewhere: Secondary | ICD-10-CM | POA: Diagnosis not present

## 2024-01-10 DIAGNOSIS — K047 Periapical abscess without sinus: Secondary | ICD-10-CM

## 2024-01-10 DIAGNOSIS — A419 Sepsis, unspecified organism: Secondary | ICD-10-CM | POA: Diagnosis not present

## 2024-01-10 DIAGNOSIS — I8291 Chronic embolism and thrombosis of unspecified vein: Secondary | ICD-10-CM | POA: Diagnosis not present

## 2024-01-10 DIAGNOSIS — E1065 Type 1 diabetes mellitus with hyperglycemia: Secondary | ICD-10-CM | POA: Diagnosis not present

## 2024-01-10 LAB — GLUCOSE, CAPILLARY
Glucose-Capillary: 256 mg/dL — ABNORMAL HIGH (ref 70–99)
Glucose-Capillary: 279 mg/dL — ABNORMAL HIGH (ref 70–99)
Glucose-Capillary: 315 mg/dL — ABNORMAL HIGH (ref 70–99)
Glucose-Capillary: 213 mg/dL — ABNORMAL HIGH (ref 70–99)

## 2024-01-10 MED ORDER — AMOXICILLIN-POT CLAVULANATE 875-125 MG PO TABS
1.0000 | ORAL_TABLET | Freq: Two times a day (BID) | ORAL | Status: DC
  Administered 2024-01-11: 1 via ORAL
  Filled 2024-01-10: qty 1

## 2024-01-10 MED ORDER — INSULIN GLARGINE-YFGN 100 UNIT/ML ~~LOC~~ SOLN
40.0000 [IU] | Freq: Every day | SUBCUTANEOUS | Status: DC
  Administered 2024-01-10: 40 [IU] via SUBCUTANEOUS
  Filled 2024-01-10: qty 0.4

## 2024-01-10 NOTE — Inpatient Diabetes Management (Addendum)
 Inpatient Diabetes Program Recommendations  AACE/ADA: New Consensus Statement on Inpatient Glycemic Control (2015)  Target Ranges:  Prepandial:   less than 140 mg/dL      Peak postprandial:   less than 180 mg/dL (1-2 hours)      Critically ill patients:  140 - 180 mg/dL    Latest Reference Range & Units 12/24/23 09:38  Hemoglobin A1C 4.8 - 5.6 % 12.3 (H)  306 mg/dl  (H): Data is abnormally high  Latest Reference Range & Units 01/09/24 08:08 01/09/24 12:08 01/09/24 17:37 01/09/24 21:43  Glucose-Capillary 70 - 99 mg/dL 161 (H)  12 units Novolog  295 (H)  12 units Novolog  358 (H)  19 units Novolog  232 (H)   30 units Semglee  (H): Data is abnormally high  Latest Reference Range & Units 01/10/24 07:57  Glucose-Capillary 70 - 99 mg/dL 096 (H)  12 units Novolog   (H): Data is abnormally high   Admit with: SIRS without source of infection/ Low back pain  History: DM, Schizophrenia  Home DM Meds: Metformin 1000 mg BID (NOT taking)        Ozempic 0.25 mg Qweek (NOT taking)  Current Orders: Semglee 40 units at bedtime     Novolog Moderate Correction Scale/ SSI (0-15 units) TID AC      Novolog 4 units TID with meals   Per MD notes, needs insulin for home  Addendum 12pm--Met w/ pt at bedside.  Pt told me he was diagnosed with DM last year.  Was given Rx to start Metformin but self-stopped.  Does not have CBG meter at home.  Stated to me he eats a lot of bread and potatoes and drinks beverages with sugar (soda, fruit juice).  We discussed his Current A1c of 12.3%--Pt told me he knows that it is your long-term CBG value and knows it needs to be closer to 7%.   Re-reviewed basic pathophysiology of DM Type 2, basic home care, basic diabetes diet nutrition principles, importance of checking CBGs and maintaining good CBG control to prevent long-term and short-term complications.  Reviewed signs and symptoms of hyperglycemia and hypoglycemia and how to treat hypoglycemia at home.   Also reviewed blood sugar goals and A1c goals for home.  Also discussed DM diet information with patient.  Encouraged patient to avoid beverages with sugar (regular soda, sweet tea, lemonade, fruit juice) and to consume mostly water.  Discussed what foods contain carbohydrates and how carbohydrates affect the body's blood sugar levels.  Encouraged patient to be careful with his portion sizes (especially grains, starchy vegetables, and fruits).  Pt told me his wife is [place] and cooks a lot of rice and tortillas--he also likes to eat raw potatoes and raw vegetables.  We talked about limiting the rice, tortillas, and potatoes and bulking up on the protein and vegetables--Pt stated to me he loves raw vegetables.    Also discussed with pt that the Attending MD may send him home on insulin.  Pt stated to me that the MD mentioned that to him.  He was worried about the cost.  Explained to pt that Lantus insulin will be $40 per month--Pt agreeable to this.  Pt told me he has a CBG meter waiting for him at his pharmacy and he is worried about the cost of that.  I asked pt to check the cost of the meter when he goes to pick it up and we talked about an affordable meter option at [place] taht he can  purchase OTC--If the [employer] meter and supplies are cheaper he will buy the [employer] meter instead.  I asked pt to check his CBGs at least BID at home (prior to Breakfast and Prior to bedtime--pt works until 10pm and doesn't want to take his meter to work).  I also educated pt on how to use the insulin pen--We reviewed what basal insulin is, how it works, importance of timing, etc.  Educated patient on insulin pen use at home.  Reviewed all steps of insulin pen including attachment of needle, 2-unit air shot, dialing up dose, giving injection, rotation of injection sites, removing needle, disposal of sharps, storage of unused insulin, disposal of insulin etc.  Patient able to provide successful return demonstration.  Reviewed  troubleshooting with insulin pen.  Also reviewed Signs/Symptoms of Hypoglycemia with patient and how to treat Hypoglycemia at home.  Have asked RNs caring for patient to please allow patient to give all injections here in hospital as much as possible for practice.    PCP: [facility] Last Seen 12/24/2023 Established care at this visit Metformin was restarted Discussed GLP-1 med Met with the Pharmacist with PCP office 12/28/2023.  They recommended the following: -Recommend initiation of Ozempic 0.25mg  weekly x4 weeks, increasing to 0.5mg  weekly if tolerating well- submitting PA, and it has been approved by insurance.  With copay card, patient should pay $25. -Recommend changing metformin to XR formulation to help with GI upset -Recommend patient begin to check glucose at least 3x/week as we adjust medications -Recommend f/u A1c in 3 months- if 7-9% and no ketones present in urine, I recommend initiation of SGLT2    --Will follow patient during hospitalization--  Langston Pippins RN, MSN, CDCES Diabetes Coordinator Inpatient Glycemic Control Team Team Pager: [phone] (8a-5p)

## 2024-01-10 NOTE — Progress Notes (Signed)
 Introduced patient to role of Statistician. Intake questions completed.  Patient lives with his wife, who is from [place] in a rented home in [place]. Rent is $1350/mo. He states they are currently over a $1000.00 behind on the gas bill. Gas is used for heating, hot water and stove.  Patient states they live pay check to pay check. He works for [employer] in [place] [employer] and his wife also works. They utilize food banks in the area when needed.   Patient has an established PCP with [facility]. He reports being very thankful for their in office food pantry.   Will provide patient with additional food pantry resources, along with resources for possible utility assistance.   Patient denies need for referral to [facility] at present time.   Encouraged to call with questions or concerns.  Will continue to follow.

## 2024-01-10 NOTE — Discharge Instructions (Signed)
 [employer] Meter info:  [email or web address]-content/[email or web address]-[email or web address]-[email or web address]

## 2024-01-10 NOTE — Plan of Care (Signed)

## 2024-01-10 NOTE — Progress Notes (Signed)
 Date of Admission:  01/08/2024      ID: Ricardo Wood is a 43 y.o. male  Principal Problem:   Sepsis (HCC) Active Problems:   Chronic deep vein thrombosis (DVT) (HCC)   COPD (chronic obstructive pulmonary disease) (HCC)   Diabetes mellitus treated with insulin and oral medication (HCC)   Low back pain    Subjective: He is doing better Pain teeth better but says he did have pus that he pushed out  CT imaging no dental abscess- only periapical lucencies  Medications:   enoxaparin (LOVENOX) injection  0.5 mg/kg Subcutaneous Q24H   insulin aspart  0-15 Units Subcutaneous TID WC   insulin aspart  4 Units Subcutaneous TID WC   insulin glargine-yfgn  40 Units Subcutaneous Q2200   lidocaine  1 patch Transdermal Q24H    Objective: Vital signs in last 24 hours: Patient Vitals for the past 24 hrs:  BP Temp Temp src Pulse Resp SpO2  01/10/24 0759 125/86 98.2 F (36.8 C) -- 98 16 96 %  01/10/24 0633 110/73 98.1 F (36.7 C) -- 91 -- 95 %  01/09/24 2142 111/82 98.5 F (36.9 C) -- 95 -- 98 %  01/09/24 1744 120/82 99.3 F (37.4 C) Oral 96 20 98 %  01/09/24 1600 127/77 98.8 F (37.1 C) Oral 88 18 98 %     LDA Foley Central lines Other catheters  PHYSICAL EXAM:  General: Alert, cooperative, no distress, appears stated age.  No tenderness over maxillary area Lungs: Clear to auscultation bilaterally. No Wheezing or Rhonchi. No rales. Heart: Regular rate and rhythm, no murmur, rub or gallop. Abdomen: Soft, non-tender,not distended. Bowel sounds normal. No masses Extremities: atraumatic, no cyanosis. No edema. No clubbing Skin: No rashes or lesions. Or bruising Lymph: Cervical, supraclavicular normal. Neurologic: Grossly non-focal  Lab Results    Latest Ref Rng & Units 01/09/2024    6:32 AM 01/08/2024   12:09 AM 01/05/2024    6:28 PM  CBC  WBC 4.0 - 10.5 K/uL 6.7  8.3  9.3   Hemoglobin 13.0 - 17.0 g/dL 16.1  09.6  04.5   Hematocrit 39.0 - 52.0 % 44.1  48.1  45.4    Platelets 150 - 400 K/uL 131  166  173        Latest Ref Rng & Units 01/09/2024    6:32 AM 01/08/2024   12:09 AM 01/05/2024    6:28 PM  CMP  Glucose 70 - 99 mg/dL 409  811  914   BUN 6 - 20 mg/dL 12  22  13    Creatinine 0.61 - 1.24 mg/dL 7.82  9.56  2.13   Sodium 135 - 145 mmol/L 134  133  128   Potassium 3.5 - 5.1 mmol/L 3.9  4.0  4.3   Chloride 98 - 111 mmol/L 101  100  93   CO2 22 - 32 mmol/L 27  22  26    Calcium 8.9 - 10.3 mg/dL 8.4  9.3  9.0   Total Protein 6.5 - 8.1 g/dL 6.4  7.6  7.0   Total Bilirubin 0.0 - 1.2 mg/dL 0.6  0.6  0.6   Alkaline Phos 38 - 126 U/L 63  74  74   AST 15 - 41 U/L 23  26  27    ALT 0 - 44 U/L 33  40  39       Microbiology: [facility] - NG Studies/Results: CT MAXILLOFACIAL W CONTRAST Result Date: 01/08/2024 CLINICAL DATA:  Dental abscess  EXAM: CT MAXILLOFACIAL WITH CONTRAST TECHNIQUE: Multidetector CT imaging of the maxillofacial structures was performed with intravenous contrast. Multiplanar CT image reconstructions were also generated. RADIATION DOSE REDUCTION: This exam was performed according to the departmental dose-optimization program which includes automated exposure control, adjustment of the mA and/or kV according to patient size and/or use of iterative reconstruction technique. CONTRAST:  80mL OMNIPAQUE IOHEXOL 300 MG/ML  SOLN COMPARISON:  None FINDINGS: Osseous: No acute fracture identified. No dislocation. There are periapical lucencies surrounding the bilateral lateral maxillary incisors. There is also periapical lucency surrounding the tip of the second left lateral maxillary second bicuspid. Orbits: Negative. No traumatic or inflammatory finding. Sinuses: Clear. Soft tissues: No enhancing fluid collections are identified to suggest abscess. There is soft tissue prominence of the adenoids bilaterally. Limited intracranial: No significant or unexpected finding. IMPRESSION: 1. No evidence for abscess. 2. Periapical lucencies surrounding the bilateral  lateral maxillary incisors and left lateral maxillary second bicuspid. 3. Soft tissue prominence of the adenoids bilaterally. Electronically Signed   By: Tyron Gallon M.D.   On: 01/08/2024 18:35     Assessment/Plan: Fever -resolved  No leucocytosis, normal LFT/renal function, normal imaging D.D  pt has dental abscess and recently taken amoxicillin Imaging no abscess, but has periapical lucencies which could indicate infection Can do Augmentin for 7-10 days But he will need teeth extraction  Rt renal cacluli non obstructive and urine has no wbc- so unlikely the reason for fever   Enteropathogenic ecoli in stool is likely not the cause of his fever The tested stool was semisolid  He has had intermittent diarrhea since being on metformin for 3 months and recently the PCP changed to XR There eare no active skin and soft tissue infection like abscess ( thoug pripor scars of old boils seen over perineum, axillary area) No fournier's No cellulitis   HE has received 3 days of IV ceftriaxone and will not need any more IV    Rt shoulder pain- could be rotator cuff involvement-    Poorly controlled DM On insulin here   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

## 2024-01-10 NOTE — Progress Notes (Signed)
 Progress Note   Patient: Ricardo Wood ZOX:[id number] DOB: 09/18/1981 DOA: 01/08/2024     0 DOS: the patient was seen and examined on 01/10/2024   Brief hospital course:  Ricardo Wood is a 43 y.o. male with medical history significant of obesity, type 2 diabetes, history of DVT not on anticoagulation, schizophrenia presenting with sepsis, back pain, hyperglycemia.  Patient reports recurrent low back pain over the past 12 to 24 hours.  Back pain moderate to severe in intensity.  No radiation.  Has chronic lower extremity paresthesias in the setting of peripheral neuropathy associated with type 2 diabetes.  Does admit to doing some very heavy lifting over the past week or so.  Has had to unload heavy pallets at work when the past week.  No reported sick contacts.  Mild nausea.  Intermittent diarrhea which is chronic.  Mild chest pain and shortness of breath.  No focal hemiparesis or confusion.  No reported dysuria, increased urinary frequency. Does not check blood sugars on regular basis.  Baseline history of recurrent DVT.  Not on anticoagulation.  Currently followed by Dr. Smith Robert.  Does admit to having some intermittent dark stools which is chronic.  Has not been on anticoagulation for multiple years. Presented to the ER Tmax one 1.5, heart rate 100s, BP stable.  Satting well on room air.  White count 8.3, hemoglobin 16.5, platelets 166, lactate 2-1.2.  COVID flu and RSV negative.  Creatinine 0.8.  Glucose 280s.  Chest x-ray stable.  CT abdomen pelvis grossly stable apart from right nephrolithiasis without hydronephrosis.  MRI T and L-spine negative for any acute infection though with noted moderate right and mild left L5 foraminal stenosis as well as degenerative changes from L1-L2 and L4-L5. Review of Systems: As mentioned in the history of present illness. All other systems reviewed and are negative.    Assessment and Plan:   * SIRS without any source of infection Low back pain Patient had  a fever on admission with a Tmax of 101.5 and was tachycardic with heart rate in the 100.   Lactate was elevated at 2 but normal white cell count but no obvious source of infection.  Lactic acidosis may be related to metformin use Patient with complaints of back pain concerning for discitis.  Imaging was negative for discitis. Blood cultures are sterile for 2 days MRI of the thoracic spine shows no MRI evidence for acute infection within the thoracic spine. Multilevel thoracic spondylosis without significant spinal stenosis. Moderate left foraminal narrowing at T7-8, with moderate bilateral foraminal narrowing at T8-9. MRI of the lumbar spine shows no MRI evidence for acute infection within the lumbar spine. 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 Multifactorial degenerative changes at L1-2 through L4-5 with resultant mild to moderate diffuse spinal stenosis, with mild to moderate bilateral L2 through L4 foraminal narrowing as above. Worsening low back pain appears to be musculoskeletal and related to strenuous activity, patient does a lot of heavy lifting at work Pain control Muscle relaxants         Diabetes mellitus with hyperglycemia Hemoglobin A1C is 12.3 Increase Levemir to 40 units NovoLog 4 units with meals Appreciate diabetic coordinator input Patient will need to be discharged home on insulin       COPD (chronic obstructive pulmonary disease) (HCC) Fairly stable from a respiratory standpoint Continue home inhaler      Chronic deep vein thrombosis (  DVT) (HCC) Noted chronic recurrent DVT Followed by Dr. Randy Buttery outpatient Has been intermittently compliant with anticoagulation Will place on prophylactic Lovenox for now pending outpatient follow-up        Morbid Obesity (BMI 41.77 kg/m2) Complicates overall prognosis and care Lifestyle modification and exercise has been  discussed with patient in detail        Subjective: Continues to complain of back pain but improved from admission  Physical Exam: Vitals:   01/09/24 1744 01/09/24 2142 01/10/24 0633 01/10/24 0759  BP: 120/82 111/82 110/73 125/86  Pulse: 96 95 91 98  Resp: 20   16  Temp: 99.3 F (37.4 C) 98.5 F (36.9 C) 98.1 F (36.7 C) 98.2 F (36.8 C)  TempSrc: Oral     SpO2: 98% 98% 95% 96%  Weight:      Height:        Appearance: He is obese.  HENT:     Head: Normocephalic and atraumatic.     Nose: Nose normal.     Mouth/Throat:     Mouth: Mucous membranes are moist.  Eyes:     Pupils: Pupils are equal, round, and reactive to light.  Cardiovascular:     Rate and Rhythm: Normal rate and regular rhythm.  Pulmonary:     Effort: Pulmonary effort is normal.  Abdominal:     General: Bowel sounds are normal.  Musculoskeletal:     Comments: Mild low back TTP   Skin:    General: Skin is warm.  Neurological:     General: No focal deficit present.  Psychiatric:        Mood and Affect: Mood normal.    Data Reviewed:  There are no new results to review at this time.  Family Communication: Plan of care was discussed with patient in detail.  He verbalizes understanding and agrees to the plan.  Disposition: Status is: Observation The patient remains OBS appropriate and will d/c before 2 midnights.  Planned Discharge Destination: Home    Time spent: 38 minutes  Author: Read Camel, MD 01/10/2024 11:05 AM  For on call review [email or web address].

## 2024-01-10 NOTE — Telephone Encounter (Signed)
 Pharmacy Patient Advocate Encounter  Insurance verification completed.    The patient is insured through [facility]. Patient has [employer], may use a copay card, and/or apply for patient assistance if available.    Ran test claim for LANTUS SOLOSTAR, NOVOLOG FLEXPEN and the current 30 day co-pay is 40.00.   This test claim was processed through [facility]- copay amounts may vary at other pharmacies due to pharmacy/plan contracts, or as the patient moves through the different stages of their insurance plan.

## 2024-01-11 ENCOUNTER — Inpatient Hospital Stay: Admitting: Nurse Practitioner

## 2024-01-11 DIAGNOSIS — I8291 Chronic embolism and thrombosis of unspecified vein: Secondary | ICD-10-CM

## 2024-01-11 DIAGNOSIS — K029 Dental caries, unspecified: Secondary | ICD-10-CM

## 2024-01-11 DIAGNOSIS — G8929 Other chronic pain: Secondary | ICD-10-CM

## 2024-01-11 DIAGNOSIS — R6510 Systemic inflammatory response syndrome (SIRS) of non-infectious origin without acute organ dysfunction: Secondary | ICD-10-CM | POA: Diagnosis present

## 2024-01-11 DIAGNOSIS — E66813 Obesity, class 3: Secondary | ICD-10-CM

## 2024-01-11 DIAGNOSIS — E1065 Type 1 diabetes mellitus with hyperglycemia: Secondary | ICD-10-CM | POA: Diagnosis not present

## 2024-01-11 DIAGNOSIS — J449 Chronic obstructive pulmonary disease, unspecified: Secondary | ICD-10-CM

## 2024-01-11 DIAGNOSIS — A419 Sepsis, unspecified organism: Secondary | ICD-10-CM | POA: Diagnosis not present

## 2024-01-11 LAB — GLUCOSE, CAPILLARY: Glucose-Capillary: 188 mg/dL — ABNORMAL HIGH (ref 70–99)

## 2024-01-11 MED ORDER — INSULIN GLARGINE-YFGN 100 UNIT/ML ~~LOC~~ SOLN: 40.0000 [IU] | Freq: Every day | SUBCUTANEOUS | 0 refills | Status: AC

## 2024-01-11 MED ORDER — OLMESARTAN MEDOXOMIL 20 MG PO TABS: 20.0000 mg | ORAL_TABLET | Freq: Every day | ORAL | 0 refills | Status: DC

## 2024-01-11 MED ORDER — ROSUVASTATIN CALCIUM 20 MG PO TABS: 20.0000 mg | ORAL_TABLET | Freq: Every day | ORAL | 0 refills | Status: DC

## 2024-01-11 MED ORDER — LIDOCAINE 5 % EX PTCH: 1.0000 | MEDICATED_PATCH | CUTANEOUS | 0 refills | Status: AC

## 2024-01-11 MED ORDER — CYCLOBENZAPRINE HCL 5 MG PO TABS: 5.0000 mg | ORAL_TABLET | Freq: Three times a day (TID) | ORAL | 0 refills | Status: DC | PRN

## 2024-01-11 MED ORDER — METFORMIN HCL ER 500 MG PO TB24: 1000.0000 mg | ORAL_TABLET | Freq: Two times a day (BID) | ORAL | 0 refills | Status: DC

## 2024-01-11 MED ORDER — AMOXICILLIN-POT CLAVULANATE 875-125 MG PO TABS: 1.0000 | ORAL_TABLET | Freq: Two times a day (BID) | ORAL | 0 refills | Status: DC

## 2024-01-11 NOTE — Progress Notes (Deleted)
 There were no vitals taken for this visit.   Subjective:    Patient ID: Ricardo Wood, male    DOB: 1981-05-03, 43 y.o.   MRN: [id number]  HPI: Ricardo Wood is a 43 y.o. male  No chief complaint on file.  Transition of Care Hospital Follow up.   Hospital/Facility: D/C Physician:  D/C Date:   Records Requested:  Records Received:  Records Reviewed:   Diagnoses on Discharge:   * SIRS without any source of infection Low back pain (acute on chronic) Patient had a fever on admission with a Tmax of 101.5 and was tachycardic with heart rate in the 100.   Lactate was elevated at 2 but normal white cell count and no obvious source of infection.  Lactic acidosis may be related to metformin use Patient with complaints of back pain concerning for discitis.  Imaging was negative for discitis. Blood cultures are sterile for 3 days MRI of the thoracic spine shows no MRI evidence for acute infection within the thoracic spine. Multilevel thoracic spondylosis without significant spinal stenosis. Moderate left foraminal narrowing at T7-8, with moderate bilateral foraminal narrowing at T8-9. MRI of the lumbar spine shows no MRI evidence for acute infection within the lumbar spine. 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 Multifactorial degenerative changes at L1-2 through L4-5 with resultant mild to moderate diffuse spinal stenosis, with mild to moderate bilateral L2 through L4 foraminal narrowing as above. Worsening low back pain appears to be musculoskeletal and related to strenuous activity, patient does a lot of heavy lifting at work Pain control Muscle relaxants   Diabetes mellitus with hyperglycemia Hemoglobin A1C is 12.3 Improved glycemic control on Levemir  40 units Continue metformin 1 g twice daily Appreciate diabetic coordinator input Patient will  be discharged home on  insulin and is able to self administer his medication Advised to check his blood sugars and keep a log and take to his primary care provider for further adjustments in his insulin.   COPD (chronic obstructive pulmonary disease) (HCC) Fairly stable from a respiratory standpoint Continue home inhaler      Chronic deep vein thrombosis (DVT) (HCC) Noted chronic recurrent DVT Followed by Dr. Smith Robert outpatient Has been intermittently compliant with anticoagulation Will place on prophylactic Lovenox for now pending outpatient follow-up   Morbid Obesity (BMI 41.77 kg/m2) Class 3 Complicates overall prognosis and care Lifestyle modification and exercise has been discussed with patient in detail    Date of interactive Contact within 48 hours of discharge:  Contact was through: {Blank single:19197::"phone","e-mail","direct","other"}  Date of 7 day or 14 day face-to-face visit:    {Blank single:19197::"within 7 days","within 14 days"}  Outpatient Encounter Medications as of 01/11/2024  Medication Sig   amoxicillin-clavulanate (AUGMENTIN) 875-125 MG tablet Take 1 tablet by mouth 2 (two) times daily for 7 days.   Blood Glucose Monitoring Suppl DEVI 1 each by Does not apply route in the morning, at noon, and at bedtime. May substitute to any manufacturer covered by patient's insurance.   cyclobenzaprine (FLEXERIL) 5 MG tablet Take 1 tablet (5 mg total) by mouth 3 (three) times daily as needed for muscle spasms.   Glucose Blood (BLOOD GLUCOSE TEST STRIPS) STRP 1 each by In Vitro route in the morning, at noon, and at bedtime. May substitute to any manufacturer covered by patient's insurance.   insulin glargine-yfgn (SEMGLEE) 100 UNIT/ML injection Inject 0.4 mLs (40 Units  total) into the skin daily at 10 pm.   Lancet Device MISC 1 each by Does not apply route in the morning, at noon, and at bedtime. May substitute to any manufacturer covered by patient's insurance.   Lancets Misc. MISC 1 each by Does not  apply route in the morning, at noon, and at bedtime. May substitute to any manufacturer covered by patient's insurance.   lidocaine (LIDODERM) 5 % Place 1 patch onto the skin daily. Remove & Discard patch within 12 hours or as directed by MD   metFORMIN (GLUCOPHAGE-XR) 500 MG 24 hr tablet Take 2 tablets (1,000 mg total) by mouth 2 (two) times daily with a meal.   olmesartan (BENICAR) 20 MG tablet Take 1 tablet (20 mg total) by mouth daily.   rosuvastatin (CRESTOR) 20 MG tablet Take 1 tablet (20 mg total) by mouth daily.   Facility-Administered Encounter Medications as of 01/11/2024  Medication   acetaminophen (TYLENOL) tablet 650 mg   amoxicillin-clavulanate (AUGMENTIN) 875-125 MG per tablet 1 tablet   cyclobenzaprine (FLEXERIL) tablet 5 mg   enoxaparin (LOVENOX) injection 70 mg   insulin aspart (novoLOG) injection 0-15 Units   insulin aspart (novoLOG) injection 4 Units   insulin glargine-yfgn (SEMGLEE) injection 40 Units   lidocaine (LIDODERM) 5 % 1 patch   ondansetron (ZOFRAN) tablet 4 mg   Or   ondansetron (ZOFRAN) injection 4 mg    Diagnostic Tests Reviewed/Disposition:   Consults:  Discharge Instructions  Disease/illness Education:  Home Health/Community Services Discussions/Referrals:  Establishment or re-establishment of referral orders for community resources:  Discussion with other health care providers:  Assessment and Support of treatment regimen adherence:  Appointments Coordinated with:   Education for self-management, independent living, and ADLs:   Relevant past medical, surgical, family and social history reviewed and updated as indicated. Interim medical history since our last visit reviewed. Allergies and medications reviewed and updated.  Review of Systems  Per HPI unless specifically indicated above     Objective:    There were no vitals taken for this visit.  Wt Readings from Last 3 Encounters:  01/09/24 (!) 308 lb (139.7 kg)  01/05/24 (!) 309  lb (140.2 kg)  01/04/24 (!) 309 lb 12.8 oz (140.5 kg)    Physical Exam  Results for orders placed or performed during the hospital encounter of 01/08/24  CBG monitoring, ED   Collection Time: 01/08/24 12:03 AM  Result Value Ref Range   Glucose-Capillary 276 (H) 70 - 99 mg/dL  Basic metabolic panel   Collection Time: 01/08/24 12:09 AM  Result Value Ref Range   Sodium 133 (L) 135 - 145 mmol/L   Potassium 4.0 3.5 - 5.1 mmol/L   Chloride 100 98 - 111 mmol/L   CO2 22 22 - 32 mmol/L   Glucose, Bld 286 (H) 70 - 99 mg/dL   BUN 22 (H) 6 - 20 mg/dL   Creatinine, Ser 0.27 0.61 - 1.24 mg/dL   Calcium 9.3 8.9 - 25.3 mg/dL   GFR, Estimated >66 >44 mL/min   Anion gap 11 5 - 15  CBC   Collection Time: 01/08/24 12:09 AM  Result Value Ref Range   WBC 8.3 4.0 - 10.5 K/uL   RBC 5.42 4.22 - 5.81 MIL/uL   Hemoglobin 16.5 13.0 - 17.0 g/dL   HCT 03.4 74.2 - 59.5 %   MCV 88.7 80.0 - 100.0 fL   MCH 30.4 26.0 - 34.0 pg   MCHC 34.3 30.0 - 36.0 g/dL   RDW 12.3  11.5 - 15.5 %   Platelets 166 150 - 400 K/uL   nRBC 0.0 0.0 - 0.2 %  Urinalysis, Routine w reflex microscopic -Urine, Clean Catch   Collection Time: 01/08/24 12:09 AM  Result Value Ref Range   Color, Urine YELLOW (A) YELLOW   APPearance CLEAR (A) CLEAR   Specific Gravity, Urine 1.028 1.005 - 1.030   pH 5.0 5.0 - 8.0   Glucose, UA >=500 (A) NEGATIVE mg/dL   Hgb urine dipstick NEGATIVE NEGATIVE   Bilirubin Urine NEGATIVE NEGATIVE   Ketones, ur 5 (A) NEGATIVE mg/dL   Protein, ur 30 (A) NEGATIVE mg/dL   Nitrite NEGATIVE NEGATIVE   Leukocytes,Ua NEGATIVE NEGATIVE   RBC / HPF 0-5 0 - 5 RBC/hpf   WBC, UA 0-5 0 - 5 WBC/hpf   Bacteria, UA NONE SEEN NONE SEEN   Squamous Epithelial / HPF 0-5 0 - 5 /HPF   Mucus PRESENT   Beta-hydroxybutyric acid   Collection Time: 01/08/24 12:09 AM  Result Value Ref Range   Beta-Hydroxybutyric Acid 0.22 0.05 - 0.27 mmol/L  Hepatic function panel   Collection Time: 01/08/24 12:09 AM  Result Value Ref Range    Total Protein 7.6 6.5 - 8.1 g/dL   Albumin 3.9 3.5 - 5.0 g/dL   AST 26 15 - 41 U/L   ALT 40 0 - 44 U/L   Alkaline Phosphatase 74 38 - 126 U/L   Total Bilirubin 0.6 0.0 - 1.2 mg/dL   Bilirubin, Direct 0.1 0.0 - 0.2 mg/dL   Indirect Bilirubin 0.5 0.3 - 0.9 mg/dL  Procalcitonin   Collection Time: 01/08/24 12:09 AM  Result Value Ref Range   Procalcitonin 0.23 ng/mL  Troponin I (High Sensitivity)   Collection Time: 01/08/24 12:09 AM  Result Value Ref Range   Troponin I (High Sensitivity) 7 <18 ng/L  Lactic acid, plasma   Collection Time: 01/08/24 12:10 AM  Result Value Ref Range   Lactic Acid, Venous 2.0 (HH) 0.5 - 1.9 mmol/L  Resp panel by RT-PCR (RSV, Flu A&B, Covid) Anterior Nasal Swab   Collection Time: 01/08/24 12:45 AM   Specimen: Anterior Nasal Swab  Result Value Ref Range   SARS Coronavirus 2 by RT PCR NEGATIVE NEGATIVE   Influenza A by PCR NEGATIVE NEGATIVE   Influenza B by PCR NEGATIVE NEGATIVE   Resp Syncytial Virus by PCR NEGATIVE NEGATIVE  Blood Culture (routine x 2)   Collection Time: 01/08/24 12:45 AM   Specimen: BLOOD  Result Value Ref Range   Specimen Description BLOOD RIGHT ANTECUBITAL    Special Requests      BOTTLES DRAWN AEROBIC AND ANAEROBIC Blood Culture results may not be optimal due to an inadequate volume of blood received in culture bottles   Culture      NO GROWTH 3 DAYS Performed at [facility], [address] [place], [place] [address]    Report Status PENDING   Blood Culture (routine x 2)   Collection Time: 01/08/24 12:50 AM   Specimen: BLOOD  Result Value Ref Range   Specimen Description BLOOD LEFT ANTECUBITAL    Special Requests      BOTTLES DRAWN AEROBIC AND ANAEROBIC Blood Culture results may not be optimal due to an inadequate volume of blood received in culture bottles   Culture      NO GROWTH 3 DAYS Performed at [facility], [address], [place], [place] [address]    Report Status PENDING   Lactic acid,  plasma  Collection Time: 01/08/24  3:10 AM  Result Value Ref Range   Lactic Acid, Venous 1.2 0.5 - 1.9 mmol/L  Troponin I (High Sensitivity)   Collection Time: 01/08/24  3:10 AM  Result Value Ref Range   Troponin I (High Sensitivity) 6 <18 ng/L  CBG monitoring, ED   Collection Time: 01/08/24  7:01 AM  Result Value Ref Range   Glucose-Capillary 337 (H) 70 - 99 mg/dL  CBG monitoring, ED   Collection Time: 01/08/24  7:45 AM  Result Value Ref Range   Glucose-Capillary 327 (H) 70 - 99 mg/dL  Respiratory (~47 pathogens) panel by PCR   Collection Time: 01/08/24  7:48 AM   Specimen: Nasopharyngeal Swab; Respiratory  Result Value Ref Range   Adenovirus NOT DETECTED NOT DETECTED   Coronavirus 229E NOT DETECTED NOT DETECTED   Coronavirus HKU1 NOT DETECTED NOT DETECTED   Coronavirus NL63 NOT DETECTED NOT DETECTED   Coronavirus OC43 NOT DETECTED NOT DETECTED   Metapneumovirus NOT DETECTED NOT DETECTED   Rhinovirus / Enterovirus NOT DETECTED NOT DETECTED   Influenza A NOT DETECTED NOT DETECTED   Influenza B NOT DETECTED NOT DETECTED   Parainfluenza Virus 1 NOT DETECTED NOT DETECTED   Parainfluenza Virus 2 NOT DETECTED NOT DETECTED   Parainfluenza Virus 3 NOT DETECTED NOT DETECTED   Parainfluenza Virus 4 NOT DETECTED NOT DETECTED   Respiratory Syncytial Virus NOT DETECTED NOT DETECTED   Bordetella pertussis NOT DETECTED NOT DETECTED   Bordetella Parapertussis NOT DETECTED NOT DETECTED   Chlamydophila pneumoniae NOT DETECTED NOT DETECTED   Mycoplasma pneumoniae NOT DETECTED NOT DETECTED  HIV Antibody (routine testing w rflx)   Collection Time: 01/08/24  9:17 AM  Result Value Ref Range   HIV Screen 4th Generation wRfx Non Reactive Non Reactive  Gastrointestinal Panel by PCR , Stool   Collection Time: 01/08/24 11:29 AM   Specimen: Stool  Result Value Ref Range   Campylobacter species NOT DETECTED NOT DETECTED   Plesimonas shigelloides NOT DETECTED NOT DETECTED   Salmonella species NOT  DETECTED NOT DETECTED   Yersinia enterocolitica NOT DETECTED NOT DETECTED   Vibrio species NOT DETECTED NOT DETECTED   Vibrio cholerae NOT DETECTED NOT DETECTED   Enteroaggregative E coli (EAEC) NOT DETECTED NOT DETECTED   Enteropathogenic E coli (EPEC) DETECTED (A) NOT DETECTED   Enterotoxigenic E coli (ETEC) NOT DETECTED NOT DETECTED   Shiga like toxin producing E coli (STEC) NOT DETECTED NOT DETECTED   Shigella/Enteroinvasive E coli (EIEC) NOT DETECTED NOT DETECTED   Cryptosporidium NOT DETECTED NOT DETECTED   Cyclospora cayetanensis NOT DETECTED NOT DETECTED   Entamoeba histolytica NOT DETECTED NOT DETECTED   Giardia lamblia NOT DETECTED NOT DETECTED   Adenovirus F40/41 NOT DETECTED NOT DETECTED   Astrovirus NOT DETECTED NOT DETECTED   Norovirus GI/GII NOT DETECTED NOT DETECTED   Rotavirus A NOT DETECTED NOT DETECTED   Sapovirus (I, II, IV, and V) NOT DETECTED NOT DETECTED  CBG monitoring, ED   Collection Time: 01/08/24 11:43 AM  Result Value Ref Range   Glucose-Capillary 346 (H) 70 - 99 mg/dL  CBG monitoring, ED   Collection Time: 01/08/24  2:34 PM  Result Value Ref Range   Glucose-Capillary 348 (H) 70 - 99 mg/dL  CBG monitoring, ED   Collection Time: 01/08/24  5:31 PM  Result Value Ref Range   Glucose-Capillary 299 (H) 70 - 99 mg/dL  CBG monitoring, ED   Collection Time: 01/08/24 11:46 PM  Result Value Ref Range   Glucose-Capillary  292 (H) 70 - 99 mg/dL  CBC   Collection Time: 01/09/24  6:32 AM  Result Value Ref Range   WBC 6.7 4.0 - 10.5 K/uL   RBC 4.95 4.22 - 5.81 MIL/uL   Hemoglobin 15.0 13.0 - 17.0 g/dL   HCT 16.1 09.6 - 04.5 %   MCV 89.1 80.0 - 100.0 fL   MCH 30.3 26.0 - 34.0 pg   MCHC 34.0 30.0 - 36.0 g/dL   RDW 40.9 81.1 - 91.4 %   Platelets 131 (L) 150 - 400 K/uL   nRBC 0.0 0.0 - 0.2 %  Comprehensive metabolic panel   Collection Time: 01/09/24  6:32 AM  Result Value Ref Range   Sodium 134 (L) 135 - 145 mmol/L   Potassium 3.9 3.5 - 5.1 mmol/L    Chloride 101 98 - 111 mmol/L   CO2 27 22 - 32 mmol/L   Glucose, Bld 282 (H) 70 - 99 mg/dL   BUN 12 6 - 20 mg/dL   Creatinine, Ser 7.82 0.61 - 1.24 mg/dL   Calcium 8.4 (L) 8.9 - 10.3 mg/dL   Total Protein 6.4 (L) 6.5 - 8.1 g/dL   Albumin 3.2 (L) 3.5 - 5.0 g/dL   AST 23 15 - 41 U/L   ALT 33 0 - 44 U/L   Alkaline Phosphatase 63 38 - 126 U/L   Total Bilirubin 0.6 0.0 - 1.2 mg/dL   GFR, Estimated >95 >62 mL/min   Anion gap 6 5 - 15  CBG monitoring, ED   Collection Time: 01/09/24  8:08 AM  Result Value Ref Range   Glucose-Capillary 260 (H) 70 - 99 mg/dL  CBG monitoring, ED   Collection Time: 01/09/24 12:08 PM  Result Value Ref Range   Glucose-Capillary 295 (H) 70 - 99 mg/dL  Glucose, capillary   Collection Time: 01/09/24  5:37 PM  Result Value Ref Range   Glucose-Capillary 358 (H) 70 - 99 mg/dL  Glucose, capillary   Collection Time: 01/09/24  9:43 PM  Result Value Ref Range   Glucose-Capillary 232 (H) 70 - 99 mg/dL  Glucose, capillary   Collection Time: 01/10/24  7:57 AM  Result Value Ref Range   Glucose-Capillary 256 (H) 70 - 99 mg/dL  Glucose, capillary   Collection Time: 01/10/24 12:05 PM  Result Value Ref Range   Glucose-Capillary 279 (H) 70 - 99 mg/dL  Glucose, capillary   Collection Time: 01/10/24  3:53 PM  Result Value Ref Range   Glucose-Capillary 315 (H) 70 - 99 mg/dL  Glucose, capillary   Collection Time: 01/10/24  9:02 PM  Result Value Ref Range   Glucose-Capillary 213 (H) 70 - 99 mg/dL  Glucose, capillary   Collection Time: 01/11/24  7:35 AM  Result Value Ref Range   Glucose-Capillary 188 (H) 70 - 99 mg/dL      Assessment & Plan:   Problem List Items Addressed This Visit   None    Follow up plan: No follow-ups on file.

## 2024-01-11 NOTE — Discharge Summary (Addendum)
 Physician Discharge Summary   Patient: Ricardo Wood MRN: [id number] DOB: [birth year]  Admit date:     01/08/2024  Discharge date: 01/11/24  Discharge Physician: Aaronjames Kelsay   PCP: Aileen Alexanders, NP   Recommendations at discharge:   Take medications as recommended including insulin Check blood sugars daily and keep a log Keep scheduled follow-up appointment with primary care provider  Discharge Diagnoses: Principal Problem:   SIRS due to non-infectious process without acute organ dysfunction (HCC) Active Problems:   Diabetes mellitus treated with insulin and oral medication (HCC)   Low back pain   Chronic dental infection   Fever   Chronic deep vein thrombosis (DVT) (HCC)   COPD (chronic obstructive pulmonary disease) (HCC)  Resolved Problems:   * No resolved hospital problems. *  Hospital Course: Ricardo Wood is a 43 y.o. male with medical history significant of obesity, type 2 diabetes, history of DVT not on anticoagulation, schizophrenia presenting with sepsis, back pain, hyperglycemia.  Patient reports recurrent low back pain over the past 12 to 24 hours.  Back pain moderate to severe in intensity.  No radiation.  Has chronic lower extremity paresthesias in the setting of peripheral neuropathy associated with type 2 diabetes.  Does admit to doing some very heavy lifting over the past week or so.  Has had to unload heavy pallets at work when the past week.  No reported sick contacts.  Mild nausea.  Intermittent diarrhea which is chronic.  Mild chest pain and shortness of breath.  No focal hemiparesis or confusion.  No reported dysuria, increased urinary frequency. Does not check blood sugars on regular basis.  Baseline history of recurrent DVT.  Not on anticoagulation.  Currently followed by Dr. Randy Buttery.  Does admit to having some intermittent dark stools which is chronic.  Has not been on anticoagulation for multiple years. Presented to the ER Tmax one 1.5, heart rate  100s, BP stable.  Satting well on room air.  White count 8.3, hemoglobin 16.5, platelets 166, lactate 2-1.2.  COVID flu and RSV negative.  Creatinine 0.8.  Glucose 280s.  Chest x-ray stable.  CT abdomen pelvis grossly stable apart from right nephrolithiasis without hydronephrosis.  MRI T and L-spine negative for any acute infection though with noted moderate right and mild left L5 foraminal stenosis as well as degenerative changes from L1-L2 and L4-L5. Review of Systems: As mentioned in the history of present illness. All other systems reviewed and are negative.       Assessment and Plan:   * SIRS without any source of infection Low back pain (acute on chronic) Patient had a fever on admission with a Tmax of 101.5 and was tachycardic with heart rate in the 100.   Lactate was elevated at 2 but normal white cell count and no obvious source of infection.  Lactic acidosis may be related to metformin use Patient with complaints of back pain concerning for discitis.  Imaging was negative for discitis. Blood cultures are sterile for 3 days MRI of the thoracic spine shows no MRI evidence for acute infection within the thoracic spine. Multilevel thoracic spondylosis without significant spinal stenosis. Moderate left foraminal narrowing at T7-8, with moderate bilateral foraminal narrowing at T8-9. MRI of the lumbar spine shows no MRI evidence for acute infection within the lumbar spine. 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 Multifactorial degenerative changes at L1-2 through L4-5 with resultant mild to  moderate diffuse spinal stenosis, with mild to moderate bilateral L2 through L4 foraminal narrowing as above. Worsening low back pain appears to be musculoskeletal and related to strenuous activity, patient does a lot of heavy lifting at work Pain control Muscle relaxants         Diabetes mellitus  with hyperglycemia Hemoglobin A1C is 12.3 Improved glycemic control on Levemir  40 units Continue metformin 1 g twice daily Appreciate diabetic coordinator input Patient will  be discharged home on insulin and is able to self administer his medication Advised to check his blood sugars and keep a log and take to his primary care provider for further adjustments in his insulin.       COPD (chronic obstructive pulmonary disease) (HCC) Fairly stable from a respiratory standpoint Continue home inhaler      Chronic deep vein thrombosis (DVT) (HCC) Noted chronic recurrent DVT Followed by Dr. Smith Robert outpatient Has been intermittently compliant with anticoagulation Will place on prophylactic Lovenox for now pending outpatient follow-up         Morbid Obesity (BMI 41.77 kg/m2) Class 3 Complicates overall prognosis and care Lifestyle modification and exercise has been discussed with patient in detail    Dental Caries Will discharge on 7 days of Augmentin Patient advised to follow up with dental surgery on discharge for extraction      Consultants: None Procedures performed: None  Disposition: Home Diet recommendation:  Discharge Diet Orders (From admission, onward)     Start     Ordered   01/11/24 0000  Diet - low sodium heart healthy        01/11/24 0905   01/11/24 0000  Diet Carb Modified        01/11/24 0905           Cardiac and Carb modified diet DISCHARGE MEDICATION: Allergies as of 01/11/2024       Reactions   Vancomycin Other (See Comments)   Arletha Grippe Syndrome   Onion    Other Rash   Allergic to adhesive on fabric bandages        Medication List     STOP taking these medications    Breztri Aerosphere 160-9-4.8 MCG/ACT Aero inhaler Generic drug: budeson-glycopyrrolate-formoterol   Eliquis 5 MG Tabs tablet Generic drug: apixaban   methocarbamol 500 MG tablet Commonly known as: ROBAXIN   Ozempic (0.25 or 0.5 MG/DOSE) 2 MG/1.5ML Sopn Generic drug:  Semaglutide(0.25 or 0.5MG /DOS)       TAKE these medications    Blood Glucose Monitoring Suppl Devi 1 each by Does not apply route in the morning, at noon, and at bedtime. May substitute to any manufacturer covered by patient's insurance.   BLOOD GLUCOSE TEST STRIPS Strp 1 each by In Vitro route in the morning, at noon, and at bedtime. May substitute to any manufacturer covered by patient's insurance.   cyclobenzaprine 5 MG tablet Commonly known as: FLEXERIL Take 1 tablet (5 mg total) by mouth 3 (three) times daily as needed for muscle spasms.   insulin glargine-yfgn 100 UNIT/ML injection Commonly known as: SEMGLEE Inject 0.4 mLs (40 Units total) into the skin daily at 10 pm.   Lancet Device Misc 1 each by Does not apply route in the morning, at noon, and at bedtime. May substitute to any manufacturer covered by patient's insurance.   Lancets Misc. Misc 1 each by Does not apply route in the morning, at noon, and at bedtime. May substitute to any manufacturer covered by patient's insurance.   lidocaine 5 %  Commonly known as: LIDODERM Place 1 patch onto the skin daily. Remove & Discard patch within 12 hours or as directed by MD   metFORMIN 500 MG 24 hr tablet Commonly known as: GLUCOPHAGE-XR Take 2 tablets (1,000 mg total) by mouth 2 (two) times daily with a meal. What changed: Another medication with the same name was removed. Continue taking this medication, and follow the directions you see here.   olmesartan 20 MG tablet Commonly known as: BENICAR Take 1 tablet (20 mg total) by mouth daily.   rosuvastatin 20 MG tablet Commonly known as: CRESTOR Take 1 tablet (20 mg total) by mouth daily.        Follow-up Information     Larae Grooms, NP Follow up.   Specialty: Nurse Practitioner Why: Hospital follow up Contact information: [address] [place] [address] [phone]         Larae Grooms, NP Follow up in 1 week(s).   Specialty: Nurse  Practitioner Contact information: [address] [place] [address] [phone]                Discharge Exam: Ceasar Mons Weights   01/09/24 0831  Weight: (!) 139.7 kg    Appearance: He is obese.  HENT:     Head: Normocephalic and atraumatic.     Nose: Nose normal.     Mouth/Throat:     Mouth: Mucous membranes are moist.  Eyes:     Pupils: Pupils are equal, round, and reactive to light.  Cardiovascular:     Rate and Rhythm: Normal rate and regular rhythm.  Pulmonary:     Effort: Pulmonary effort is normal.  Abdominal:     General: Bowel sounds are normal.  Musculoskeletal:     Comments: Mild low back TTP   Skin:    General: Skin is warm.  Neurological:     General: No focal deficit present.  Psychiatric:        Mood and Affect: Mood normal.      Condition at discharge: stable  The results of significant diagnostics from this hospitalization (including imaging, microbiology, ancillary and laboratory) are listed below for reference.   Imaging Studies: CT MAXILLOFACIAL W CONTRAST Result Date: 01/08/2024 CLINICAL DATA:  Dental abscess EXAM: CT MAXILLOFACIAL WITH CONTRAST TECHNIQUE: Multidetector CT imaging of the maxillofacial structures was performed with intravenous contrast. Multiplanar CT image reconstructions were also generated. RADIATION DOSE REDUCTION: This exam was performed according to the departmental dose-optimization program which includes automated exposure control, adjustment of the mA and/or kV according to patient size and/or use of iterative reconstruction technique. CONTRAST:  80mL OMNIPAQUE IOHEXOL 300 MG/ML  SOLN COMPARISON:  None FINDINGS: Osseous: No acute fracture identified. No dislocation. There are periapical lucencies surrounding the bilateral lateral maxillary incisors. There is also periapical lucency surrounding the tip of the second left lateral maxillary second bicuspid. Orbits: Negative. No traumatic or inflammatory finding. Sinuses:  Clear. Soft tissues: No enhancing fluid collections are identified to suggest abscess. There is soft tissue prominence of the adenoids bilaterally. Limited intracranial: No significant or unexpected finding. IMPRESSION: 1. No evidence for abscess. 2. Periapical lucencies surrounding the bilateral lateral maxillary incisors and left lateral maxillary second bicuspid. 3. Soft tissue prominence of the adenoids bilaterally. Electronically Signed   By: Darliss Cheney M.D.   On: 01/08/2024 18:35   MR Lumbar Spine W Wo Contrast Result Date: 01/08/2024 CLINICAL DATA:  Initial evaluation for acute low back pain, infection suspected. EXAM: MRI LUMBAR SPINE WITHOUT AND  WITH CONTRAST TECHNIQUE: Multiplanar and multiecho pulse sequences of the lumbar spine were obtained without and with intravenous contrast. CONTRAST:  10mL GADAVIST GADOBUTROL 1 MMOL/ML IV SOLN COMPARISON:  None Available. FINDINGS: Segmentation: Standard. Lowest well-formed disc space labeled the L5-S1 level. Alignment: Chronic bilateral pars defects at L5 with associated 6 mm spondylolisthesis of L5 on S1. Trace stepwise degenerative retrolisthesis of L1 on L2 through L4 on L5. Vertebrae: Vertebral body height maintained without acute or chronic fracture. Bone marrow signal intensity within normal limits. No worrisome osseous lesions. Reactive endplate change with mild marrow edema and enhancement about the L1-2 interspace noted, favored to be degenerative in nature. No other evidence for osteomyelitis discitis or septic arthritis within the lumbar spine. Conus medullaris and cauda equina: Conus extends to the T12 level. Conus and cauda equina appear normal. Paraspinal and other soft tissues: Unremarkable. Disc levels: L1-2: Degenerative intervertebral disc space narrowing with diffuse disc bulge and disc desiccation. Reactive endplate spurring. Mild bilateral facet hypertrophy. Resultant mild spinal stenosis. Foramina remain patent. L2-3: Diffuse disc bulge  with disc desiccation. Reactive endplate spurring. Mild bilateral facet hypertrophy. Mild epidural lipomatosis. Resultant mild-to-moderate spinal stenosis. Mild bilateral L2 foraminal narrowing. L3-4: Mild diffuse disc bulge. Superimposed small central disc protrusion minimally indents the ventral epidural fat. Mild facet hypertrophy. Epidural lipomatosis. Moderate spinal stenosis, largely due to epidural lipomatosis. Mild to moderate bilateral L3 foraminal narrowing. L4-5: Minimal disc bulge. Mild facet spurring. Epidural lipomatosis. No significant spinal stenosis. Mild-to-moderate left greater than right L4 foraminal stenosis. L5-S1: Chronic bilateral pars defects with 6 mm spondylolisthesis. Associated broad posterior pseudo disc bulge/uncovering. Moderate facet hypertrophy. Epidural lipomatosis. No spinal stenosis. Moderate right with mild left L5 foraminal stenosis. IMPRESSION: 1. No MRI evidence for acute infection within the lumbar spine. 2. 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 3. Multifactorial degenerative changes at L1-2 through L4-5 with resultant mild to moderate diffuse spinal stenosis, with mild to moderate bilateral L2 through L4 foraminal narrowing as above. Electronically Signed   By: Virgia Griffins M.D.   On: 01/08/2024 03:22   MR THORACIC SPINE W WO CONTRAST Result Date: 01/08/2024 CLINICAL DATA:  Initial evaluation for mid back pain, neuro deficit, fever, evaluate epidural abscess. EXAM: MRI THORACIC WITHOUT AND WITH CONTRAST TECHNIQUE: Multiplanar and multiecho pulse sequences of the thoracic spine were obtained without and with intravenous contrast. CONTRAST:  10mL GADAVIST GADOBUTROL 1 MMOL/ML IV SOLN COMPARISON:  None Available. FINDINGS: Alignment: Straightening of the normal thoracic kyphosis. Underlying trace dextroscoliosis. Trace anterolisthesis of T8 on T9. Vertebrae:  Vertebral body height maintained without acute or chronic fracture. Multiple scattered chronic endplate Schmorl's node deformities noted throughout the mid and lower thoracic spine. Bone marrow signal intensity within normal limits. No worrisome osseous lesions. No evidence for osteomyelitis discitis or septic arthritis. Cord: Normal signal and morphology. No epidural collections. No abnormal enhancement. Paraspinal and other soft tissues: Unremarkable. Disc levels: T1-2: Disc desiccation with mild disc bulge and endplate spurring. No stenosis. T2-3: Mild disc bulge with endplate spurring. Mild facet hypertrophy. No significant stenosis. T3-4: Mild disc bulge with superimposed small right paracentral disc protrusion. Mild posterior element hypertrophy. No significant stenosis. T4-5: Mild disc bulge with endplate spurring. Mild posterior element hypertrophy. No stenosis. T5-6: Left paracentral disc protrusion flattens the ventral thecal sac. Mild cord flattening without cord signal changes or significant spinal stenosis. Foramina remain patent. T6-7: Mild disc bulge. No spinal stenosis. Foramina  remain patent. T7-8: Mild disc bulge. Left greater than right facet hypertrophy. No spinal stenosis. Moderate left foraminal narrowing. Right neural foramen remains patent. T8-9: Trace anterolisthesis. Disc desiccation with mild disc bulge. Moderate bilateral facet hypertrophy. No spinal stenosis. Moderate bilateral foraminal narrowing. T9-10: Disc desiccation with mild disc bulge. Mild bilateral facet hypertrophy. No significant spinal stenosis. Foramina remain patent. T10-11: Diffuse disc bulge, asymmetric to the right. Reactive endplate spurring. Right worse than left facet hypertrophy. No significant spinal stenosis. Foramina remain patent. T11-12: Minimal disc bulge. Mild left-sided facet hypertrophy. No spinal stenosis. Mild left foraminal narrowing. Right neural foramen remains patent. T12-L1:  Negative interspace.  No  canal or foraminal stenosis. IMPRESSION: 1. No MRI evidence for acute infection within the thoracic spine. 2. Multilevel thoracic spondylosis without significant spinal stenosis. Moderate left foraminal narrowing at T7-8, with moderate bilateral foraminal narrowing at T8-9. Electronically Signed   By: Virgia Griffins M.D.   On: 01/08/2024 03:14   CT ABDOMEN PELVIS W CONTRAST Result Date: 01/08/2024 CLINICAL DATA:  Abdominal pain, fever EXAM: CT ABDOMEN AND PELVIS WITH CONTRAST TECHNIQUE: Multidetector CT imaging of the abdomen and pelvis was performed using the standard protocol following bolus administration of intravenous contrast. RADIATION DOSE REDUCTION: This exam was performed according to the departmental dose-optimization program which includes automated exposure control, adjustment of the mA and/or kV according to patient size and/or use of iterative reconstruction technique. CONTRAST:  OMNIPAQUE IOHEXOL 350 MG/ML SOLN COMPARISON:  01/05/2024 FINDINGS: Lower chest: No acute abnormality Hepatobiliary: Diffuse low-density throughout the liver compatible with fatty infiltration. No focal abnormality. Gallbladder unremarkable. Pancreas: No focal abnormality or ductal dilatation. Spleen: No focal abnormality.  Normal size. Adrenals/Urinary Tract: 3 mm nonobstructing stones in the mid and lower pole of the right kidney. No renal stones on the left. No ureteral stones or hydronephrosis bilaterally. Adrenal glands and urinary bladder unremarkable. Stomach/Bowel: Normal appendix. Stomach, large and small bowel grossly unremarkable. Vascular/Lymphatic: No evidence of aneurysm or adenopathy. IVC filter in the infrarenal IVC. Reproductive: No visible focal abnormality. Other: No free fluid or free air. Musculoskeletal: No acute bony abnormality. IMPRESSION: No acute findings in the abdomen or pelvis. Right nephrolithiasis.  No hydronephrosis. Hepatic steatosis. Electronically Signed   By: Janeece Mechanic  M.D.   On: 01/08/2024 01:25   DG Chest Port 1 View Result Date: 01/08/2024 CLINICAL DATA:  Questionable sepsis - evaluate for abnormality EXAM: PORTABLE CHEST 1 VIEW COMPARISON:  Chest x-ray 09/08/2023, CT chest 09/08/2023 FINDINGS: The heart and mediastinal contours are unchanged. No focal consolidation. No pulmonary edema. No pleural effusion. No pneumothorax. No acute osseous abnormality. IMPRESSION: No active disease. Electronically Signed   By: Morgane  Naveau M.D.   On: 01/08/2024 01:10   CT Renal Stone Study Result Date: 01/05/2024 CLINICAL DATA:  Abdominal/flank pain, stone suspected Patient reports left-sided pain. EXAM: CT ABDOMEN AND PELVIS WITHOUT CONTRAST TECHNIQUE: Multidetector CT imaging of the abdomen and pelvis was performed following the standard protocol without IV contrast. RADIATION DOSE REDUCTION: This exam was performed according to the departmental dose-optimization program which includes automated exposure control, adjustment of the mA and/or kV according to patient size and/or use of iterative reconstruction technique. COMPARISON:  Contrast enhanced CT 09/08/2023 FINDINGS: Lower chest: Clear lung bases. Hepatobiliary: The liver is enlarged spanning 21 cm cranial caudal with diffuse steatosis. No evidence of focal liver abnormality on this unenhanced exam. The gallbladder is decompressed. Pancreas: No ductal dilatation or inflammation. Spleen: Upper normal spleen size, 13 cm AP.  No  focal abnormality. Adrenals/Urinary Tract: Normal adrenal glands. No hydronephrosis. Two nonobstructing right intrarenal calculi. No left renal stones. No perinephric stranding. Unremarkable urinary bladder. Stomach/Bowel: Stomach is within normal limits. Appendix appears normal. No evidence of bowel wall thickening, distention, or inflammatory changes. Moderate colonic stool burden. Vascular/Lymphatic: IVC filter in place. Normal caliber abdominal aorta. Few prominent periportal nodes are typically  reactive. No suspicious lymphadenopathy. Reproductive: Prostate is unremarkable. Other: No free air, free fluid, or intra-abdominal fluid collection. No abdominal wall hernia. Musculoskeletal: Chronic bilateral L5 pars interarticularis defects with trace anterolisthesis of L5 on S1. There is mild multilevel degenerative change in the spine. No acute osseous findings. IMPRESSION: 1. Nonobstructing right intrarenal calculi. No ureteral stones or hydronephrosis. 2. Hepatomegaly with hepatic steatosis. 3. 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 Electronically Signed   By: Narda Rutherford M.D.   On: 01/05/2024 20:46    Microbiology: Results for orders placed or performed during the hospital encounter of 01/08/24  Resp panel by RT-PCR (RSV, Flu A&B, Covid) Anterior Nasal Swab     Status: None   Collection Time: 01/08/24 12:45 AM   Specimen: Anterior Nasal Swab  Result Value Ref Range Status   SARS Coronavirus 2 by RT PCR NEGATIVE NEGATIVE Final    Comment: (NOTE) SARS-CoV-2 target nucleic acids are NOT DETECTED.  The SARS-CoV-2 RNA is generally detectable in upper respiratory specimens during the acute phase of infection. The lowest concentration of SARS-CoV-2 viral copies this assay can detect is 138 copies/mL. A negative result does not preclude SARS-Cov-2 infection and should not be used as the sole basis for treatment or other patient management decisions. A negative result may occur with  improper specimen collection/handling, submission of specimen other than nasopharyngeal swab, presence of viral mutation(s) within the areas targeted by this assay, and inadequate number of viral copies(<138 copies/mL). A negative result must be combined with clinical observations, patient history, and epidemiological information. The expected result is Negative.  Fact Sheet for Patients:  BloggerCourse.com  Fact Sheet for  Healthcare Providers:  SeriousBroker.it  This test is no t yet approved or cleared by the [place] FDA and  has been authorized for detection and/or diagnosis of SARS-CoV-2 by FDA under an Emergency Use Authorization (EUA). This EUA will remain  in effect (meaning this test can be used) for the duration of the COVID-19 declaration under Section 564(b)(1) of the Act, 21 U.S.C.section 360bbb-3(b)(1), unless the authorization is terminated  or revoked sooner.       Influenza A by PCR NEGATIVE NEGATIVE Final   Influenza B by PCR NEGATIVE NEGATIVE Final    Comment: (NOTE) The Xpert Xpress SARS-CoV-2/FLU/RSV plus assay is intended as an aid in the diagnosis of influenza from Nasopharyngeal swab specimens and should not be used as a sole basis for treatment. Nasal washings and aspirates are unacceptable for Xpert Xpress SARS-CoV-2/FLU/RSV testing.  Fact Sheet for Patients: BloggerCourse.com  Fact Sheet for Healthcare Providers: SeriousBroker.it  This test is not yet approved or cleared by the [place] FDA and has been authorized for detection and/or diagnosis of SARS-CoV-2 by FDA under an Emergency Use Authorization (EUA). This EUA will remain in effect (meaning this test can be used) for the duration of the COVID-19 declaration under Section 564(b)(1) of the Act, 21 U.S.C. section 360bbb-3(b)(1), unless the authorization is terminated or revoked.     Resp Syncytial Virus by PCR NEGATIVE NEGATIVE Final    Comment: (NOTE) Fact Sheet for Patients:  BloggerCourse.com  Fact Sheet for Healthcare Providers: SeriousBroker.it  This test is not yet approved or cleared by the [place] FDA and has been authorized for detection and/or diagnosis of SARS-CoV-2 by FDA under an Emergency Use Authorization (EUA). This EUA will remain in effect (meaning this  test can be used) for the duration of the COVID-19 declaration under Section 564(b)(1) of the Act, 21 U.S.C. section 360bbb-3(b)(1), unless the authorization is terminated or revoked.  Performed at [facility], [address] [place], [place] [address]   Blood Culture (routine x 2)     Status: None (Preliminary result)   Collection Time: 01/08/24 12:45 AM   Specimen: BLOOD  Result Value Ref Range Status   Specimen Description BLOOD RIGHT ANTECUBITAL  Final   Special Requests   Final    BOTTLES DRAWN AEROBIC AND ANAEROBIC Blood Culture results may not be optimal due to an inadequate volume of blood received in culture bottles   Culture   Final    NO GROWTH 3 DAYS Performed at [facility], [address], [place], [place] [address]    Report Status PENDING  Incomplete  Blood Culture (routine x 2)     Status: None (Preliminary result)   Collection Time: 01/08/24 12:50 AM   Specimen: BLOOD  Result Value Ref Range Status   Specimen Description BLOOD LEFT ANTECUBITAL  Final   Special Requests   Final    BOTTLES DRAWN AEROBIC AND ANAEROBIC Blood Culture results may not be optimal due to an inadequate volume of blood received in culture bottles   Culture   Final    NO GROWTH 3 DAYS Performed at [facility], [address] [place], [place] [address]    Report Status PENDING  Incomplete  Respiratory (~20 pathogens) panel by PCR     Status: None   Collection Time: 01/08/24  7:48 AM   Specimen: Nasopharyngeal Swab; Respiratory  Result Value Ref Range Status   Adenovirus NOT DETECTED NOT DETECTED Final   Coronavirus 229E NOT DETECTED NOT DETECTED Final    Comment: (NOTE) The Coronavirus on the Respiratory Panel, DOES NOT test for the novel  Coronavirus (2019 nCoV)    Coronavirus HKU1 NOT DETECTED NOT DETECTED Final   Coronavirus NL63 NOT DETECTED NOT DETECTED Final   Coronavirus OC43 NOT DETECTED NOT DETECTED Final   Metapneumovirus NOT DETECTED NOT  DETECTED Final   Rhinovirus / Enterovirus NOT DETECTED NOT DETECTED Final   Influenza A NOT DETECTED NOT DETECTED Final   Influenza B NOT DETECTED NOT DETECTED Final   Parainfluenza Virus 1 NOT DETECTED NOT DETECTED Final   Parainfluenza Virus 2 NOT DETECTED NOT DETECTED Final   Parainfluenza Virus 3 NOT DETECTED NOT DETECTED Final   Parainfluenza Virus 4 NOT DETECTED NOT DETECTED Final   Respiratory Syncytial Virus NOT DETECTED NOT DETECTED Final   Bordetella pertussis NOT DETECTED NOT DETECTED Final   Bordetella Parapertussis NOT DETECTED NOT DETECTED Final   Chlamydophila pneumoniae NOT DETECTED NOT DETECTED Final   Mycoplasma pneumoniae NOT DETECTED NOT DETECTED Final    Comment: Performed at [facility], [address] [place], [place] [address]  Gastrointestinal Panel by PCR , Stool     Status: Abnormal   Collection Time: 01/08/24 11:29 AM   Specimen: Stool  Result Value Ref Range Status   Campylobacter species NOT DETECTED NOT DETECTED Final   Plesimonas shigelloides NOT DETECTED NOT DETECTED Final   Salmonella species NOT DETECTED NOT DETECTED Final  Yersinia enterocolitica NOT DETECTED NOT DETECTED Final   Vibrio species NOT DETECTED NOT DETECTED Final   Vibrio cholerae NOT DETECTED NOT DETECTED Final   Enteroaggregative E coli (EAEC) NOT DETECTED NOT DETECTED Final   Enteropathogenic E coli (EPEC) DETECTED (A) NOT DETECTED Final    Comment: RESULT CALLED TO, READ BACK BY AND VERIFIED WITH: DEVYN TAYLOR/NURSE @ [id number] 25 BY SKY    Enterotoxigenic E coli (ETEC) NOT DETECTED NOT DETECTED Final   Shiga like toxin producing E coli (STEC) NOT DETECTED NOT DETECTED Final   Shigella/Enteroinvasive E coli (EIEC) NOT DETECTED NOT DETECTED Final   Cryptosporidium NOT DETECTED NOT DETECTED Final   Cyclospora cayetanensis NOT DETECTED NOT DETECTED Final   Entamoeba histolytica NOT DETECTED NOT DETECTED Final   Giardia lamblia NOT DETECTED NOT DETECTED Final   Adenovirus  F40/41 NOT DETECTED NOT DETECTED Final   Astrovirus NOT DETECTED NOT DETECTED Final   Norovirus GI/GII NOT DETECTED NOT DETECTED Final   Rotavirus A NOT DETECTED NOT DETECTED Final   Sapovirus (I, II, IV, and V) NOT DETECTED NOT DETECTED Final    Comment: Performed at [facility], [address] [place], [place] [address]    Labs: CBC: Recent Labs  Lab 01/05/24 1828 01/08/24 0009 01/09/24 0632  WBC 9.3 8.3 6.7  NEUTROABS 4.4  --   --   HGB 15.2 16.5 15.0  HCT 45.4 48.1 44.1  MCV 90.4 88.7 89.1  PLT 173 166 131*   Basic Metabolic Panel: Recent Labs  Lab 01/05/24 1828 01/08/24 0009 01/09/24 0632  NA 128* 133* 134*  K 4.3 4.0 3.9  CL 93* 100 101  CO2 26 22 27   GLUCOSE 549* 286* 282*  BUN 13 22* 12  CREATININE 0.85 0.82 0.70  CALCIUM 9.0 9.3 8.4*   Liver Function Tests: Recent Labs  Lab 01/05/24 1828 01/08/24 0009 01/09/24 0632  AST 27 26 23   ALT 39 40 33  ALKPHOS 74 74 63  BILITOT 0.6 0.6 0.6  PROT 7.0 7.6 6.4*  ALBUMIN 3.7 3.9 3.2*   CBG: Recent Labs  Lab 01/10/24 0757 01/10/24 1205 01/10/24 1553 01/10/24 2102 01/11/24 0735  GLUCAP 256* 279* 315* 213* 188*    Discharge time spent: greater than 30 minutes.  Signed: Read Camel, MD [facility] 01/11/2024

## 2024-01-11 NOTE — Plan of Care (Signed)

## 2024-01-13 LAB — CULTURE, BLOOD (ROUTINE X 2)
Culture: NO GROWTH
Culture: NO GROWTH

## 2024-01-14 ENCOUNTER — Other Ambulatory Visit: Payer: Self-pay | Admitting: Nurse Practitioner

## 2024-01-14 DIAGNOSIS — E1169 Type 2 diabetes mellitus with other specified complication: Secondary | ICD-10-CM

## 2024-01-14 NOTE — Telephone Encounter (Signed)
 Copied from CRM [id number]. Topic: Clinical - Prescription Issue >> Jan 14, 2024  1:12 PM Carla L wrote: Reason for CRM: Patient states he was not given the needle part for the insulin  glargine-yfgn (SEMGLEE ) 100 UNIT/ML injection. Patient needing that part to be sent in to pharmacy.   Patient states he had spoken to [facility] in regards to this and was calling back to give her the name.

## 2024-01-14 NOTE — Telephone Encounter (Signed)
 Routing to provider. Prescription t'd up for needles.

## 2024-01-14 NOTE — Addendum Note (Signed)
 Addended by: Delphia Ficks on: 01/14/2024 01:40 PM   Modules accepted: Orders

## 2024-01-17 MED ORDER — BD PEN NEEDLE NANO 2ND GEN 32G X 4 MM MISC: 1.0000 | Freq: Every day | 3 refills | Status: AC

## 2024-01-18 ENCOUNTER — Other Ambulatory Visit: Payer: Self-pay

## 2024-01-18 ENCOUNTER — Ambulatory Visit: Admitting: Nurse Practitioner

## 2024-01-18 ENCOUNTER — Encounter: Payer: Self-pay | Admitting: Nurse Practitioner

## 2024-01-18 ENCOUNTER — Telehealth: Payer: Self-pay | Admitting: *Deleted

## 2024-01-18 VITALS — BP 117/73 | HR 81 | Temp 98.2°F | Resp 17 | Ht 72.01 in | Wt 315.2 lb

## 2024-01-18 DIAGNOSIS — Z09 Encounter for follow-up examination after completed treatment for conditions other than malignant neoplasm: Secondary | ICD-10-CM | POA: Diagnosis not present

## 2024-01-18 DIAGNOSIS — I825Y9 Chronic embolism and thrombosis of unspecified deep veins of unspecified proximal lower extremity: Secondary | ICD-10-CM

## 2024-01-18 DIAGNOSIS — E119 Type 2 diabetes mellitus without complications: Secondary | ICD-10-CM

## 2024-01-18 DIAGNOSIS — Z7984 Long term (current) use of oral hypoglycemic drugs: Secondary | ICD-10-CM

## 2024-01-18 DIAGNOSIS — Z794 Long term (current) use of insulin: Secondary | ICD-10-CM | POA: Diagnosis not present

## 2024-01-18 MED ORDER — OZEMPIC (0.25 OR 0.5 MG/DOSE) 2 MG/3ML ~~LOC~~ SOPN
0.2500 mg | PEN_INJECTOR | SUBCUTANEOUS | 1 refills | Status: DC
  Filled 2024-01-18: qty 3, 42d supply, fill #0
  Filled 2024-03-06 – 2024-04-03 (×2): qty 3, 42d supply, fill #1
  Filled 2024-05-08: qty 3, 28d supply, fill #2
  Filled 2024-05-16: qty 3, 42d supply, fill #2

## 2024-01-18 MED ORDER — METFORMIN HCL ER 500 MG PO TB24: 1000.0000 mg | ORAL_TABLET | Freq: Two times a day (BID) | ORAL | 0 refills | Status: DC

## 2024-01-18 MED ORDER — METFORMIN HCL ER 500 MG PO TB24
1000.0000 mg | ORAL_TABLET | Freq: Two times a day (BID) | ORAL | 0 refills | Status: DC
  Filled 2024-01-18 – 2024-01-19 (×3): qty 360, 90d supply, fill #0

## 2024-01-18 MED ORDER — BLOOD GLUCOSE MONITOR SYSTEM W/DEVICE KIT
1.0000 | PACK | Freq: Three times a day (TID) | 0 refills | Status: AC
Start: 2024-01-18 — End: ?
  Filled 2024-01-18 – 2024-08-23 (×3): qty 1, 30d supply, fill #0

## 2024-01-18 MED ORDER — OLMESARTAN MEDOXOMIL 20 MG PO TABS: 20.0000 mg | ORAL_TABLET | Freq: Every day | ORAL | 0 refills | Status: DC

## 2024-01-18 MED ORDER — ROSUVASTATIN CALCIUM 20 MG PO TABS: 20.0000 mg | ORAL_TABLET | Freq: Every day | ORAL | 0 refills | Status: DC

## 2024-01-18 MED ORDER — FREESTYLE LITE TEST VI STRP
1.0000 | ORAL_STRIP | 0 refills | Status: DC
Start: 2024-01-18 — End: 2024-08-23
  Filled 2024-01-18 (×2): qty 100, 33d supply, fill #0

## 2024-01-18 MED ORDER — FREESTYLE LANCETS MISC
1.0000 | 0 refills | Status: DC
Start: 2024-01-18 — End: 2024-08-23
  Filled 2024-01-18: qty 100, 33d supply, fill #0

## 2024-01-18 MED ORDER — ROSUVASTATIN CALCIUM 20 MG PO TABS
20.0000 mg | ORAL_TABLET | Freq: Every day | ORAL | 0 refills | Status: DC
Start: 2024-01-18 — End: 2024-04-03
  Filled 2024-01-18 – 2024-01-19 (×3): qty 90, 90d supply, fill #0

## 2024-01-18 MED ORDER — OLMESARTAN MEDOXOMIL 20 MG PO TABS
20.0000 mg | ORAL_TABLET | Freq: Every day | ORAL | 0 refills | Status: DC
  Filled 2024-01-18 – 2024-01-19 (×3): qty 90, 90d supply, fill #0

## 2024-01-18 NOTE — Assessment & Plan Note (Signed)
 Chronic. Not well controlled.  Started on Semglee  in the hospital.  Patient was only injecting 4u.  He spoke with Pharmacist on the phone earlier today who educated patient on proper use of insulin .  Discussed with patient to continue with Semglee .  Will also work to get patient Ozempic  sample then in the future will d/c Semglee  and continue to titrate up on Ozempic .  He does have medicaid which the pharmacy did not have on file.  Working to get that on file to make medications more affordable.  Also switched pharmacies to [facility] for improved communication. Not currently taking, ARB, metformin , or Rosuvastatin .  Follow up in 2 weeks.  Call sooner if concerns arise.

## 2024-01-18 NOTE — Addendum Note (Signed)
 Addended by: Aileen Alexanders on: 01/18/2024 03:39 PM   Modules accepted: Orders

## 2024-01-18 NOTE — Addendum Note (Signed)
 Addended by: Aileen Alexanders on: 01/18/2024 03:02 PM   Modules accepted: Orders

## 2024-01-18 NOTE — Assessment & Plan Note (Signed)
 Following up with Dr. Randy Buttery at [facility].  Will likely be on long term Eliquis .

## 2024-01-18 NOTE — Progress Notes (Signed)
 BP 117/73 (BP Location: Left Arm, Patient Position: Sitting, Cuff Size: Large)   Pulse 81   Temp 98.2 F (36.8 C) (Oral)   Resp 17   Ht 6' 0.01" (1.829 m)   Wt (!) 315 lb 3.2 oz (143 kg)   SpO2 98%   BMI 42.74 kg/m    Subjective:    Patient ID: Ricardo Wood, male    DOB: 1980-12-06, 43 y.o.   MRN: [id number]  HPI: Ricardo Wood is a 43 y.o. male  Chief Complaint  Patient presents with   Hospitalization Follow-up    Ed follow up fever. Little better since home. Still some weakness.    Transition of Care Hospital Follow up.   Hospital/Facility: [facility] D/C Physician: Dr. Meyer Ada  D/C Date: 01/11/24  Records Requested: NA Records Received: Yes Records Reviewed: Yes  Diagnoses on Discharge:   * SIRS without any source of infection Low back pain (acute on chronic) Patient had a fever on admission with a Tmax of 101.5 and was tachycardic with heart rate in the 100.   Lactate was elevated at 2 but normal white cell count and no obvious source of infection.  Lactic acidosis may be related to metformin  use Patient with complaints of back pain concerning for discitis.  Imaging was negative for discitis. Blood cultures are sterile for 3 days MRI of the thoracic spine shows no MRI evidence for acute infection within the thoracic spine. Multilevel thoracic spondylosis without significant spinal stenosis. Moderate left foraminal narrowing at T7-8, with moderate bilateral foraminal narrowing at T8-9. MRI of the lumbar spine shows no MRI evidence for acute infection within the lumbar spine. 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 Multifactorial degenerative changes at L1-2 through L4-5 with resultant mild to moderate diffuse spinal stenosis, with mild to moderate bilateral L2 through L4 foraminal narrowing as above. Worsening low back pain appears to be musculoskeletal and related  to strenuous activity, patient does a lot of heavy lifting at work Pain control Muscle relaxants   Diabetes mellitus with hyperglycemia Hemoglobin A1C is 12.3 Improved glycemic control on Levemir  40 units Continue metformin  1 g twice daily Appreciate diabetic coordinator input Patient will  be discharged home on insulin  and is able to self administer his medication Advised to check his blood sugars and keep a log and take to his primary care provider for further adjustments in his insulin .   COPD (chronic obstructive pulmonary disease) (HCC) Fairly stable from a respiratory standpoint Continue home inhaler      Chronic deep vein thrombosis (DVT) (HCC) Noted chronic recurrent DVT Followed by Dr. Randy Buttery outpatient Has been intermittently compliant with anticoagulation Will place on prophylactic Lovenox  for now pending outpatient follow-up   Morbid Obesity (BMI 41.77 kg/m2) Class 3 Complicates overall prognosis and care Lifestyle modification and exercise has been discussed with patient in detail    Date of interactive Contact within 48 hours of discharge:  Contact was through: other  Date of 7 day or 14 day face-to-face visit:    within 7 days  Outpatient Encounter Medications as of 01/18/2024  Medication Sig   Blood Glucose Monitoring Suppl (BLOOD GLUCOSE MONITOR SYSTEM) w/Device KIT Use to check blood sugar in the morning, at noon, and at bedtime.   Glucose Blood (BLOOD GLUCOSE TEST STRIPS) STRP 1 each by In Vitro route in the morning, at noon, and at bedtime. May substitute to any  manufacturer covered by [employer].   insulin  glargine-yfgn (SEMGLEE ) 100 UNIT/ML injection Inject 0.4 mLs (40 Units total) into the skin daily at 10 pm.   Insulin  Pen Needle (BD PEN NEEDLE NANO 2ND GEN) 32G X 4 MM MISC 1 each by Does not apply route daily at 2 PM.   Lancet Device MISC 1 each by Does not apply route in the morning, at noon, and at bedtime. May substitute to any manufacturer  covered by patient's insurance.   Lancets Misc. MISC 1 each by Does not apply route in the morning, at noon, and at bedtime. May substitute to any manufacturer covered by patient's insurance.   lidocaine  (LIDODERM ) 5 % Place 1 patch onto the skin daily. Remove & Discard patch within 12 hours or as directed by MD   metFORMIN  (GLUCOPHAGE -XR) 500 MG 24 hr tablet Take 2 tablets (1,000 mg total) by mouth 2 (two) times daily with a meal.   olmesartan  (BENICAR ) 20 MG tablet Take 1 tablet (20 mg total) by mouth daily.   rosuvastatin  (CRESTOR ) 20 MG tablet Take 1 tablet (20 mg total) by mouth daily.   [DISCONTINUED] amoxicillin -clavulanate (AUGMENTIN ) 875-125 MG tablet Take 1 tablet by mouth 2 (two) times daily for 7 days. (Patient not taking: Reported on 01/18/2024)   [DISCONTINUED] Blood Glucose Monitoring Suppl DEVI 1 each by Does not apply route in the morning, at noon, and at bedtime. May substitute to any manufacturer covered by patient's insurance.   [DISCONTINUED] cyclobenzaprine  (FLEXERIL ) 5 MG tablet Take 1 tablet (5 mg total) by mouth 3 (three) times daily as needed for muscle spasms. (Patient not taking: Reported on 01/18/2024)   [DISCONTINUED] metFORMIN  (GLUCOPHAGE -XR) 500 MG 24 hr tablet Take 2 tablets (1,000 mg total) by mouth 2 (two) times daily with a meal. (Patient not taking: Reported on 01/18/2024)   [DISCONTINUED] olmesartan  (BENICAR ) 20 MG tablet Take 1 tablet (20 mg total) by mouth daily. (Patient not taking: Reported on 01/18/2024)   [DISCONTINUED] olmesartan  (BENICAR ) 20 MG tablet Take 1 tablet (20 mg total) by mouth daily.   [DISCONTINUED] rosuvastatin  (CRESTOR ) 20 MG tablet Take 1 tablet (20 mg total) by mouth daily. (Patient not taking: Reported on 01/18/2024)   [DISCONTINUED] rosuvastatin  (CRESTOR ) 20 MG tablet Take 1 tablet (20 mg total) by mouth daily.   No facility-administered encounter medications on file as of 01/18/2024.    Diagnostic Tests Reviewed/Disposition:  Reviewed  Consults: Reviewed  Discharge Instructions Reviewed with patient  Disease/illness Education: Provided disease education for DM2  Home Health/Community Services Discussions/Referrals: Working with [facility] [employer] [facility] or re-establishment of referral orders for community resources: Pharmacy  Discussion with other health care providers: None  Assessment and Support of treatment regimen adherence: Provided during visit  Appointments Coordinated with: Patient  Education for self-management, independent living, and ADLs: Discussed during visit.  Relevant past medical, surgical, family and social history reviewed and updated as indicated. Interim medical history since our last visit reviewed. Allergies and medications reviewed and updated.  Review of Systems  Eyes:  Negative for visual disturbance.  Respiratory:  Negative for shortness of breath.   Cardiovascular:  Negative for chest pain and leg swelling.  Neurological:  Negative for light-headedness and headaches.    Per HPI unless specifically indicated above     Objective:    BP 117/73 (BP Location: Left Arm, Patient Position: Sitting, Cuff Size: Large)   Pulse 81   Temp 98.2 F (36.8 C) (Oral)   Resp 17   Ht 6' 0.01" (  1.829 m)   Wt (!) 315 lb 3.2 oz (143 kg)   SpO2 98%   BMI 42.74 kg/m   Wt Readings from Last 3 Encounters:  01/18/24 (!) 315 lb 3.2 oz (143 kg)  01/09/24 (!) 308 lb (139.7 kg)  01/05/24 (!) 309 lb (140.2 kg)    Physical Exam Vitals and nursing note reviewed.  Constitutional:      General: He is not in acute distress.    Appearance: Normal appearance. He is obese. He is not ill-appearing, toxic-appearing or diaphoretic.  HENT:     Head: Normocephalic.     Right Ear: External ear normal.     Left Ear: External ear normal.     Nose: Nose normal. No congestion or rhinorrhea.     Mouth/Throat:     Mouth: Mucous membranes are moist.  Eyes:     General:        Right eye: No  discharge.        Left eye: No discharge.     Extraocular Movements: Extraocular movements intact.     Conjunctiva/sclera: Conjunctivae normal.     Pupils: Pupils are equal, round, and reactive to light.  Cardiovascular:     Rate and Rhythm: Normal rate and regular rhythm.     Heart sounds: No murmur heard. Pulmonary:     Effort: Pulmonary effort is normal. No respiratory distress.     Breath sounds: Normal breath sounds. No wheezing, rhonchi or rales.  Abdominal:     General: Abdomen is flat. Bowel sounds are normal.  Musculoskeletal:     Cervical back: Normal range of motion and neck supple.  Skin:    General: Skin is warm and dry.     Capillary Refill: Capillary refill takes less than 2 seconds.  Neurological:     General: No focal deficit present.     Mental Status: He is alert and oriented to person, place, and time.  Psychiatric:        Mood and Affect: Mood normal.        Behavior: Behavior normal.        Thought Content: Thought content normal.        Judgment: Judgment normal.     Results for orders placed or performed during the hospital encounter of 01/08/24  CBG monitoring, ED   Collection Time: 01/08/24 12:03 AM  Result Value Ref Range   Glucose-Capillary 276 (H) 70 - 99 mg/dL  Basic metabolic panel   Collection Time: 01/08/24 12:09 AM  Result Value Ref Range   Sodium 133 (L) 135 - 145 mmol/L   Potassium 4.0 3.5 - 5.1 mmol/L   Chloride 100 98 - 111 mmol/L   CO2 22 22 - 32 mmol/L   Glucose, Bld 286 (H) 70 - 99 mg/dL   BUN 22 (H) 6 - 20 mg/dL   Creatinine, Ser 1.61 0.61 - 1.24 mg/dL   Calcium  9.3 8.9 - 10.3 mg/dL   GFR, Estimated >09 >60 mL/min   Anion gap 11 5 - 15  CBC   Collection Time: 01/08/24 12:09 AM  Result Value Ref Range   WBC 8.3 4.0 - 10.5 K/uL   RBC 5.42 4.22 - 5.81 MIL/uL   Hemoglobin 16.5 13.0 - 17.0 g/dL   HCT 45.4 09.8 - 11.9 %   MCV 88.7 80.0 - 100.0 fL   MCH 30.4 26.0 - 34.0 pg   MCHC 34.3 30.0 - 36.0 g/dL   RDW 14.7 82.9 - 56.2 %  Platelets 166 150 - 400 K/uL   nRBC 0.0 0.0 - 0.2 %  Urinalysis, Routine w reflex microscopic -Urine, Clean Catch   Collection Time: 01/08/24 12:09 AM  Result Value Ref Range   Color, Urine YELLOW (A) YELLOW   APPearance CLEAR (A) CLEAR   Specific Gravity, Urine 1.028 1.005 - 1.030   pH 5.0 5.0 - 8.0   Glucose, UA >=500 (A) NEGATIVE mg/dL   Hgb urine dipstick NEGATIVE NEGATIVE   Bilirubin Urine NEGATIVE NEGATIVE   Ketones, ur 5 (A) NEGATIVE mg/dL   Protein, ur 30 (A) NEGATIVE mg/dL   Nitrite NEGATIVE NEGATIVE   Leukocytes,Ua NEGATIVE NEGATIVE   RBC / HPF 0-5 0 - 5 RBC/hpf   WBC, UA 0-5 0 - 5 WBC/hpf   Bacteria, UA NONE SEEN NONE SEEN   Squamous Epithelial / HPF 0-5 0 - 5 /HPF   Mucus PRESENT   Beta-hydroxybutyric acid   Collection Time: 01/08/24 12:09 AM  Result Value Ref Range   Beta-Hydroxybutyric Acid 0.22 0.05 - 0.27 mmol/L  Hepatic function panel   Collection Time: 01/08/24 12:09 AM  Result Value Ref Range   Total Protein 7.6 6.5 - 8.1 g/dL   Albumin 3.9 3.5 - 5.0 g/dL   AST 26 15 - 41 U/L   ALT 40 0 - 44 U/L   Alkaline Phosphatase 74 38 - 126 U/L   Total Bilirubin 0.6 0.0 - 1.2 mg/dL   Bilirubin, Direct 0.1 0.0 - 0.2 mg/dL   Indirect Bilirubin 0.5 0.3 - 0.9 mg/dL  Procalcitonin   Collection Time: 01/08/24 12:09 AM  Result Value Ref Range   Procalcitonin 0.23 ng/mL  Troponin I (High Sensitivity)   Collection Time: 01/08/24 12:09 AM  Result Value Ref Range   Troponin I (High Sensitivity) 7 <18 ng/L  Lactic acid, plasma   Collection Time: 01/08/24 12:10 AM  Result Value Ref Range   Lactic Acid, Venous 2.0 (HH) 0.5 - 1.9 mmol/L  Resp panel by RT-PCR (RSV, Flu A&B, Covid) Anterior Nasal Swab   Collection Time: 01/08/24 12:45 AM   Specimen: Anterior Nasal Swab  Result Value Ref Range   SARS Coronavirus 2 by RT PCR NEGATIVE NEGATIVE   Influenza A by PCR NEGATIVE NEGATIVE   Influenza B by PCR NEGATIVE NEGATIVE   Resp Syncytial Virus by PCR NEGATIVE NEGATIVE   Blood Culture (routine x 2)   Collection Time: 01/08/24 12:45 AM   Specimen: BLOOD  Result Value Ref Range   Specimen Description BLOOD RIGHT ANTECUBITAL    Special Requests      BOTTLES DRAWN AEROBIC AND ANAEROBIC Blood Culture results may not be optimal due to an inadequate volume of blood received in culture bottles   Culture      NO GROWTH 5 DAYS Performed at [facility], [address] [place], [place] [address]    Report Status 01/13/2024 FINAL   Blood Culture (routine x 2)   Collection Time: 01/08/24 12:50 AM   Specimen: BLOOD  Result Value Ref Range   Specimen Description BLOOD LEFT ANTECUBITAL    Special Requests      BOTTLES DRAWN AEROBIC AND ANAEROBIC Blood Culture results may not be optimal due to an inadequate volume of blood received in culture bottles   Culture      NO GROWTH 5 DAYS Performed at [facility], [address] [place], [place] [address]    Report Status 01/13/2024 FINAL   Lactic acid, plasma   Collection Time: 01/08/24  3:10  AM  Result Value Ref Range   Lactic Acid, Venous 1.2 0.5 - 1.9 mmol/L  Troponin I (High Sensitivity)   Collection Time: 01/08/24  3:10 AM  Result Value Ref Range   Troponin I (High Sensitivity) 6 <18 ng/L  CBG monitoring, ED   Collection Time: 01/08/24  7:01 AM  Result Value Ref Range   Glucose-Capillary 337 (H) 70 - 99 mg/dL  CBG monitoring, ED   Collection Time: 01/08/24  7:45 AM  Result Value Ref Range   Glucose-Capillary 327 (H) 70 - 99 mg/dL  Respiratory (~40 pathogens) panel by PCR   Collection Time: 01/08/24  7:48 AM   Specimen: Nasopharyngeal Swab; Respiratory  Result Value Ref Range   Adenovirus NOT DETECTED NOT DETECTED   Coronavirus 229E NOT DETECTED NOT DETECTED   Coronavirus HKU1 NOT DETECTED NOT DETECTED   Coronavirus NL63 NOT DETECTED NOT DETECTED   Coronavirus OC43 NOT DETECTED NOT DETECTED   Metapneumovirus NOT DETECTED NOT DETECTED   Rhinovirus / Enterovirus NOT DETECTED NOT  DETECTED   Influenza A NOT DETECTED NOT DETECTED   Influenza B NOT DETECTED NOT DETECTED   Parainfluenza Virus 1 NOT DETECTED NOT DETECTED   Parainfluenza Virus 2 NOT DETECTED NOT DETECTED   Parainfluenza Virus 3 NOT DETECTED NOT DETECTED   Parainfluenza Virus 4 NOT DETECTED NOT DETECTED   Respiratory Syncytial Virus NOT DETECTED NOT DETECTED   Bordetella pertussis NOT DETECTED NOT DETECTED   Bordetella Parapertussis NOT DETECTED NOT DETECTED   Chlamydophila pneumoniae NOT DETECTED NOT DETECTED   Mycoplasma pneumoniae NOT DETECTED NOT DETECTED  HIV Antibody (routine testing w rflx)   Collection Time: 01/08/24  9:17 AM  Result Value Ref Range   HIV Screen 4th Generation wRfx Non Reactive Non Reactive  Gastrointestinal Panel by PCR , Stool   Collection Time: 01/08/24 11:29 AM   Specimen: Stool  Result Value Ref Range   Campylobacter species NOT DETECTED NOT DETECTED   Plesimonas shigelloides NOT DETECTED NOT DETECTED   Salmonella species NOT DETECTED NOT DETECTED   Yersinia enterocolitica NOT DETECTED NOT DETECTED   Vibrio species NOT DETECTED NOT DETECTED   Vibrio cholerae NOT DETECTED NOT DETECTED   Enteroaggregative E coli (EAEC) NOT DETECTED NOT DETECTED   Enteropathogenic E coli (EPEC) DETECTED (A) NOT DETECTED   Enterotoxigenic E coli (ETEC) NOT DETECTED NOT DETECTED   Shiga like toxin producing E coli (STEC) NOT DETECTED NOT DETECTED   Shigella/Enteroinvasive E coli (EIEC) NOT DETECTED NOT DETECTED   Cryptosporidium NOT DETECTED NOT DETECTED   Cyclospora cayetanensis NOT DETECTED NOT DETECTED   Entamoeba histolytica NOT DETECTED NOT DETECTED   Giardia lamblia NOT DETECTED NOT DETECTED   Adenovirus F40/41 NOT DETECTED NOT DETECTED   Astrovirus NOT DETECTED NOT DETECTED   Norovirus GI/GII NOT DETECTED NOT DETECTED   Rotavirus A NOT DETECTED NOT DETECTED   Sapovirus (I, II, IV, and V) NOT DETECTED NOT DETECTED  CBG monitoring, ED   Collection Time: 01/08/24 11:43 AM   Result Value Ref Range   Glucose-Capillary 346 (H) 70 - 99 mg/dL  CBG monitoring, ED   Collection Time: 01/08/24  2:34 PM  Result Value Ref Range   Glucose-Capillary 348 (H) 70 - 99 mg/dL  CBG monitoring, ED   Collection Time: 01/08/24  5:31 PM  Result Value Ref Range   Glucose-Capillary 299 (H) 70 - 99 mg/dL  CBG monitoring, ED   Collection Time: 01/08/24 11:46 PM  Result Value Ref Range   Glucose-Capillary 292 (H) 70 -  99 mg/dL  CBC   Collection Time: 01/09/24  6:32 AM  Result Value Ref Range   WBC 6.7 4.0 - 10.5 K/uL   RBC 4.95 4.22 - 5.81 MIL/uL   Hemoglobin 15.0 13.0 - 17.0 g/dL   HCT 46.9 62.9 - 52.8 %   MCV 89.1 80.0 - 100.0 fL   MCH 30.3 26.0 - 34.0 pg   MCHC 34.0 30.0 - 36.0 g/dL   RDW 41.3 24.4 - 01.0 %   Platelets 131 (L) 150 - 400 K/uL   nRBC 0.0 0.0 - 0.2 %  Comprehensive metabolic panel   Collection Time: 01/09/24  6:32 AM  Result Value Ref Range   Sodium 134 (L) 135 - 145 mmol/L   Potassium 3.9 3.5 - 5.1 mmol/L   Chloride 101 98 - 111 mmol/L   CO2 27 22 - 32 mmol/L   Glucose, Bld 282 (H) 70 - 99 mg/dL   BUN 12 6 - 20 mg/dL   Creatinine, Ser 2.72 0.61 - 1.24 mg/dL   Calcium  8.4 (L) 8.9 - 10.3 mg/dL   Total Protein 6.4 (L) 6.5 - 8.1 g/dL   Albumin 3.2 (L) 3.5 - 5.0 g/dL   AST 23 15 - 41 U/L   ALT 33 0 - 44 U/L   Alkaline Phosphatase 63 38 - 126 U/L   Total Bilirubin 0.6 0.0 - 1.2 mg/dL   GFR, Estimated >53 >66 mL/min   Anion gap 6 5 - 15  CBG monitoring, ED   Collection Time: 01/09/24  8:08 AM  Result Value Ref Range   Glucose-Capillary 260 (H) 70 - 99 mg/dL  CBG monitoring, ED   Collection Time: 01/09/24 12:08 PM  Result Value Ref Range   Glucose-Capillary 295 (H) 70 - 99 mg/dL  Glucose, capillary   Collection Time: 01/09/24  5:37 PM  Result Value Ref Range   Glucose-Capillary 358 (H) 70 - 99 mg/dL  Glucose, capillary   Collection Time: 01/09/24  9:43 PM  Result Value Ref Range   Glucose-Capillary 232 (H) 70 - 99 mg/dL  Glucose, capillary    Collection Time: 01/10/24  7:57 AM  Result Value Ref Range   Glucose-Capillary 256 (H) 70 - 99 mg/dL  Glucose, capillary   Collection Time: 01/10/24 12:05 PM  Result Value Ref Range   Glucose-Capillary 279 (H) 70 - 99 mg/dL  Glucose, capillary   Collection Time: 01/10/24  3:53 PM  Result Value Ref Range   Glucose-Capillary 315 (H) 70 - 99 mg/dL  Glucose, capillary   Collection Time: 01/10/24  9:02 PM  Result Value Ref Range   Glucose-Capillary 213 (H) 70 - 99 mg/dL  Glucose, capillary   Collection Time: 01/11/24  7:35 AM  Result Value Ref Range   Glucose-Capillary 188 (H) 70 - 99 mg/dL      Assessment & Plan:   Problem List Items Addressed This Visit       Cardiovascular and Mediastinum   Chronic deep vein thrombosis (DVT) (HCC)   Following up with Dr. Randy Buttery at Hematology.  Will likely be on long term Eliquis .       Relevant Medications   olmesartan  (BENICAR ) 20 MG tablet   rosuvastatin  (CRESTOR ) 20 MG tablet     Endocrine   Diabetes mellitus treated with insulin  and oral medication (HCC)   Chronic. Not well controlled.  Started on Semglee  in the hospital.  Patient was only injecting 4u.  He spoke with Pharmacist on the phone earlier today who educated patient on proper  use of insulin .  Discussed with patient to continue with Semglee .  Will also work to get patient Ozempic  sample then in the future will d/c Semglee  and continue to titrate up on Ozempic .  He does have medicaid which the pharmacy did not have on file.  Working to get that on file to make medications more affordable.  Also switched pharmacies to [facility] for improved communication. Not currently taking, ARB, metformin , or Rosuvastatin .  Follow up in 2 weeks.  Call sooner if concerns arise.       Relevant Medications   metFORMIN  (GLUCOPHAGE -XR) 500 MG 24 hr tablet   olmesartan  (BENICAR ) 20 MG tablet   rosuvastatin  (CRESTOR ) 20 MG tablet   Other Visit Diagnoses       Hospital discharge follow-up    -  Primary    Doing well after hospitalization. Labs ordered during visit today.   Relevant Orders   CBC w/Diff   Comp Met (CMET)        Follow up plan: Return in about 2 weeks (around 02/01/2024) for HTN, HLD, DM2 FU.

## 2024-01-19 ENCOUNTER — Other Ambulatory Visit: Payer: Self-pay

## 2024-01-19 LAB — CBC WITH DIFFERENTIAL/PLATELET
Basophils Absolute: 0.1 10*3/uL (ref 0.0–0.2)
Basos: 1 %
EOS (ABSOLUTE): 0.2 10*3/uL (ref 0.0–0.4)
Eos: 3 %
Hematocrit: 48.8 % (ref 37.5–51.0)
Hemoglobin: 15.6 g/dL (ref 13.0–17.7)
Immature Grans (Abs): 0 10*3/uL (ref 0.0–0.1)
Immature Granulocytes: 0 %
Lymphocytes Absolute: 3.5 10*3/uL — ABNORMAL HIGH (ref 0.7–3.1)
Lymphs: 46 %
MCH: 29.3 pg (ref 26.6–33.0)
MCHC: 32 g/dL (ref 31.5–35.7)
MCV: 92 fL (ref 79–97)
Monocytes Absolute: 0.5 10*3/uL (ref 0.1–0.9)
Monocytes: 7 %
Neutrophils Absolute: 3.2 10*3/uL (ref 1.4–7.0)
Neutrophils: 43 %
Platelets: 206 10*3/uL (ref 150–450)
RBC: 5.33 x10E6/uL (ref 4.14–5.80)
RDW: 11.9 % (ref 11.6–15.4)
WBC: 7.6 10*3/uL (ref 3.4–10.8)

## 2024-01-19 LAB — COMPREHENSIVE METABOLIC PANEL WITH GFR
ALT: 43 IU/L (ref 0–44)
AST: 23 IU/L (ref 0–40)
Albumin: 4.2 g/dL (ref 4.1–5.1)
Alkaline Phosphatase: 91 IU/L (ref 44–121)
BUN/Creatinine Ratio: 14 (ref 9–20)
BUN: 12 mg/dL (ref 6–24)
Bilirubin Total: 0.4 mg/dL (ref 0.0–1.2)
CO2: 25 mmol/L (ref 20–29)
Calcium: 9.6 mg/dL (ref 8.7–10.2)
Chloride: 97 mmol/L (ref 96–106)
Creatinine, Ser: 0.86 mg/dL (ref 0.76–1.27)
Globulin, Total: 2.7 g/dL (ref 1.5–4.5)
Glucose: 371 mg/dL — ABNORMAL HIGH (ref 70–99)
Potassium: 4.5 mmol/L (ref 3.5–5.2)
Sodium: 136 mmol/L (ref 134–144)
Total Protein: 6.9 g/dL (ref 6.0–8.5)
eGFR: 112 mL/min/{1.73_m2} (ref >59)

## 2024-01-19 NOTE — Progress Notes (Signed)
   01/19/2024  Patient ID: Ricardo Wood, male   DOB: July 11, 1981, 43 y.o.   MRN: [id number]  Subjective/Objective Telephone visit to follow-up on medication management of chronic disease states  Diabetes: Current medications: Semglee  -Prescribed Semglee  40 units at recent hospital d/c, but patient has only been dialing pen to 4 units -Did not start metformin  XR 1000mg  BID or Ozempic  -Patient did pick up diabetic testing supplies but does not report any home BG readings -Most recent A1c 12.3% on 3/28   Hypertension: Current medications: None -Did not pick up olmesartan  20mg  daily, because "he does not have high blood pressure" -BP at OV on 3/28 was 127/91 -Does not monitor home BP   Hyperlipidemia/ASCVD Risk Reduction Current lipid lowering medications: none -Did not pick up rosuvastatin  20mg  daily, because "he does not have high cholesterol" -Recent LDL 141   COPD: Current medications: none -Breztri  was recently prescribed, and was going through for $0; but patient did not pick prescription up   Chronic DVT: -Recently prescribed Eliquis  5mg  BID; and with copay card, medication was going through for $10.  Patient never picked up or resumed therapy.  Assessment/Plan:    Diabetes: -Currently uncontrolled -Reviewed long term cardiovascular and renal outcomes of uncontrolled blood sugar -Reviewed goal A1c, goal fasting, and goal 2 hour post prandial glucose -Recommend initiation of Ozempic  0.25mg  weekly x4 weeks, increasing to 0.5mg  weekly if tolerating well- provided sample at PCP visit today.  Prescription sent to [facility] going through for $0 -Provided education/instruction on dialing Semglee  40 unit dose to administer daily -Recommend initiate/resume metformin  XR 1000mg  BID -Recommend patient begin to check glucose at least 3x/week as we adjust medications -Recommend f/u A1c in 3 months- if 7-9% and no ketones present in urine, I recommend initiation of SGLT2    Hypertension: -Currently moderately controlled -Discussed use of ACE-I or ARB medication for renal protection in patients with T2DM -Recommend patient pick up and initiate olmesartan  20mg  daily    Hyperlipidemia/ASCVD Risk Reduction: -Currently uncontrolled.  -Reviewed long term complications of uncontrolled cholesterol -Recommend to initiate rosuvastatin  10mg  daily with follow-up LFT and lipid panel in 3 months   COPD: -Currently controlled per recent d/c summary -If status were to decline, I recommend initiation of daily Breztri    Chronic DVT: -Patient has follow-up with Dr. Randy Buttery 4/25   Follow Up Plan: PCP has sent prescriptions to [facility], and I will coordinate with pharmacy on fills and f/u next week to verify patient picked up and has started medications  Linn Rich, PharmD, DPLA

## 2024-01-21 ENCOUNTER — Inpatient Hospital Stay: Admitting: Oncology

## 2024-01-21 ENCOUNTER — Encounter: Payer: Self-pay | Admitting: Oncology

## 2024-01-26 ENCOUNTER — Other Ambulatory Visit: Payer: Self-pay

## 2024-01-26 MED ORDER — APIXABAN 5 MG PO TABS
5.0000 mg | ORAL_TABLET | Freq: Two times a day (BID) | ORAL | 0 refills | Status: DC
  Filled 2024-01-26: qty 60, 30d supply, fill #0

## 2024-01-26 NOTE — Progress Notes (Addendum)
   01/26/2024  Patient ID: Ricardo Wood, male   DOB: 01/21/1981, 43 y.o.   MRN: [id number]  PCP sent in 1 month supply of Eliquis  5mg  BID to hopefully get patient through until he sees Dr. Randy Buttery.  Contacted pharmacy to verify insurance coverage of medication, and medication is going through for $0 copay.  Contacted patient to make him aware, and he plans to pick medication later today.  Linn Rich, PharmD, DPLA

## 2024-01-26 NOTE — Progress Notes (Signed)
   01/26/2024  Patient ID: Ricardo Wood, male   DOB: 1981/02/08, 43 y.o.   MRN: [id number]   Subjective/Objective Telephone visit to follow-up on medication management of chronic disease states   Diabetes: Current medications: Semglee  40 units daily, Ozempic  0.25mg  weekly, metformin  XR 1000mg  BID -Patient is now using 40 units of Semglee  daily (had only been dialing pen to 4 units) -Patient did pick up Metformin  XR and endorses taking 1000mg  BID as prescribed -Picked up Ozempic  0.25/0.5mg  and will have second dose of Ozempic  0.25mg  today.  Endorses tolerating well with on very mild nausea at times. -Endorses checking home FBG and states this is averaging 210-240 -Most recent A1c 12.3% on 3/28   Hypertension: Current medications: Olmesartan  20mg  -Patient did pick up olmesartan  and endorses taking 20mg  daily as prescribed -BP at OV on 3/28 was 127/91 -Does not monitor home BP   Hyperlipidemia/ASCVD Risk Reduction Current lipid lowering medications: rosuvastatin  20mg  daily  -Endorses picking up rosuvastatin  and taking 20mg  daily as prescribed -Recent LDL 141   COPD: Current medications: none -Breztri  was recently prescribed, and was going through for $0; but patient did not pick prescription up   Chronic DVT: -Recently prescribed Eliquis  5mg  BID; and with copay card, medication was going through for $10.  Patient never picked up or resumed therapy.   Assessment/Plan:    Diabetes: -Currently uncontrolled -Increase Ozempic  to 0.5mg  after 4 weeks of Ozempic  0.25mg  weekly -Continue Semglee  40 units daily and metformin  XR 1000mg  BID -Monitor and record FBG at least 3x/week as we adjust medications -Recommend f/u A1c in 3 months- if 7-9% and no ketones present in urine, I recommend initiation of SGLT2   Hypertension: -Currently moderately controlled -Continue olmesartan  20mg  daily   Hyperlipidemia/ASCVD Risk Reduction: -Currently uncontrolled.  -Continue rosuvastatin  20mg   daily  -Recommend follow-up LFT and lipid panel in 3 months; if LDL remains >70, consider increasing rosuvastatin  to 40mg  daily    COPD: -Currently controlled per recent d/c summary -If status were to decline, I recommend initiation of daily Breztri    Chronic DVT: -Patient follow-up with Dr. Randy Buttery scheduled for 4/25 marked as no show and does not appear to be rescheduled- making sure PCP aware   Follow Up Plan: 6/9   Linn Rich, PharmD, DPLA

## 2024-01-26 NOTE — Addendum Note (Signed)
 Addended by: Aileen Alexanders on: 01/26/2024 12:20 PM   Modules accepted: Orders

## 2024-01-28 ENCOUNTER — Other Ambulatory Visit: Payer: Self-pay | Admitting: *Deleted

## 2024-01-28 DIAGNOSIS — E119 Type 2 diabetes mellitus without complications: Secondary | ICD-10-CM

## 2024-01-29 NOTE — Patient Outreach (Signed)
 Complex Care Management   Visit Note  01/29/2024 late entry  Name:  Ricardo Wood MRN: [id number] DOB: 27-Mar-1981  Situation: Referral received for Complex Care Management related to SDOH Barriers:  Housing   Food insecurity Financial Resource Strain I obtained verbal consent from Patient.  Visit completed on 01/28/23 with patient  on the phone  Background:   Past Medical History:  Diagnosis Date   Bipolar 1 disorder (HCC)    Diabetes mellitus without complication (HCC)    DVT (deep venous thrombosis) (HCC)    Kidney stones    Schizophrenia (HCC)    Seizures (HCC)     Assessment: Patient Reported Symptoms:  Cognitive Cognitive Status: Alert and oriented to person, place, and time, Normal speech and language skills, Insightful and able to interpret abstract concepts Cognitive/Intellectual Conditions Management [RPT]: None reported or documented in medical history or problem list   Health Maintenance Behaviors: None  Neurological Neurological Review of Symptoms: No symptoms reported    HEENT HEENT Symptoms Reported: No symptoms reported      Cardiovascular Cardiovascular Symptoms Reported: No symptoms reported    Respiratory Respiratory Symptoms Reported: No symptoms reported Respiratory Conditions: COPD Respiratory Self-Management Outcome: 3 (uncertain)  Endocrine Patient reports the following symptoms related to hypoglycemia or hyperglycemia : Blurry vision (blurry vision at times, per patient) Is patient diabetic?: Yes Is patient checking blood sugars at home?: No (per patient, he has been encouraged to check his blood sugar very morning and after 1 meal) Endocrine Conditions: Diabetes Endocrine Management Strategies: Medication therapy Endocrine Self-Management Outcome: 3 (uncertain) Endocrine Comment: patient would like follow up with RNCM to assist with Diabetes management  Gastrointestinal Gastrointestinal Symptoms Reported: Not assessed      Genitourinary  Genitourinary Symptoms Reported: Not assessed    Integumentary Integumentary Symptoms Reported: Not assessed    Musculoskeletal Musculoskelatal Symptoms Reviewed: No symptoms reported   Number of falls in past year: 1 or less Was there an injury with Fall?: No Patient at Risk for Falls Due to: No Fall Risks  Psychosocial Psychosocial Symptoms Reported: No symptoms reported Additional Psychological Details: Per chart review, history of  Bipolar disorder and Schizphrenia. Patient currently denies sympotms of depression, states that condition is "managed" declined need for follow up at this time Behavioral Health Conditions: Bipolar affective disorder, Other Other Behavorial Health Conditions: schizophrenia Major Change/Loss/Stressor/Fears (CP): Resources Behaviors When Feeling Stressed/Fearful: work place Forensic psychologist, financial strain Administrator, Civil Service to [employer] with Loss/Stress/Change: Diversional activities Quality of Family Relationships: supportive Do you feel physically threatened by others?: No      01/28/2024    9:31 AM  Depression screen PHQ 2/9  Decreased Interest 0  Down, Depressed, Hopeless 0  PHQ - 2 Score 0    There were no vitals filed for this visit.  Medications Reviewed Today     Reviewed by Ave Leisure, LCSW (Social Worker) on 01/28/24 at [phone]  Med List Status: <None>   Medication Order Taking? Sig Documenting Provider Last Dose Status Informant  apixaban  (ELIQUIS ) 5 MG TABS tablet [id number] Yes Take 1 tablet (5 mg total) by mouth 2 (two) times daily. Aileen Alexanders, NP Taking Active   Blood Glucose Monitoring Suppl (BLOOD GLUCOSE MONITOR SYSTEM) w/Device KIT [id number] Yes Use to check blood sugar in the morning, at noon, and at bedtime. Aileen Alexanders, NP Taking Active   glucose blood (FREESTYLE LITE) test strip [id number] Yes Use to check blood sugar in the morning, at noon, and at bedtime  Taking Active  insulin  glargine-yfgn (SEMGLEE ) 100 UNIT/ML injection  [id number] Yes Inject 0.4 mLs (40 Units total) into the skin daily at 10 pm. Read Camel, MD Taking Active   Insulin  Pen Needle (BD PEN NEEDLE NANO 2ND GEN) 32G X 4 MM MISC [id number] Yes 1 each by Does not apply route daily at 2 PM. Aileen Alexanders, NP Taking Active   Lancet Device MISC [id number] Yes 1 each by Does not apply route in the morning, at noon, and at bedtime. May substitute to any manufacturer covered by patient's insurance. Shane Darling, MD Taking Active Self  Lancets (FREESTYLE) lancets [id number] Yes Use to check blood sugar in the morning, at noon, and at bedtime  Taking Active   lidocaine  (LIDODERM ) 5 % [id number] No Place 1 patch onto the skin daily. Remove & Discard patch within 12 hours or as directed by MD  Patient not taking: Reported on 01/28/2024   Read Camel, MD Not Taking Active   metFORMIN  (GLUCOPHAGE -XR) 500 MG 24 hr tablet [id number] Yes Take 2 tablets (1,000 mg total) by mouth 2 (two) times daily with a meal. Aileen Alexanders, NP Taking Active   olmesartan  (BENICAR ) 20 MG tablet [id number] Yes Take 1 tablet (20 mg total) by mouth daily. Aileen Alexanders, NP Taking Active   rosuvastatin  (CRESTOR ) 20 MG tablet [id number] Yes Take 1 tablet (20 mg total) by mouth daily. Aileen Alexanders, NP Taking Active   Semaglutide ,0.25 or 0.5MG /DOS, (OZEMPIC , 0.25 OR 0.5 MG/DOSE,) 2 MG/3ML SOPN [id number] Yes Inject 0.25 mg into the skin once a week. Start with 0.25MG  once a week for 4 weeks, then increase to 0.5MG  weekly. Aileen Alexanders, NP Taking Active            Med Note Finley Hugh, CHERYL A   Wed Jan 26, 2024 10:45 AM)              Recommendation:   PCP Follow-up 02/03/24  Follow Up Plan:   Telephone follow-up 02/17/24  Michaelle Adolphus, LCSW [place]  [facility], [facility] Licensed Clinical Social Worker Care Coordinator  Direct Dial: [phone]

## 2024-01-29 NOTE — Patient Instructions (Signed)
 Visit Information  Thank you for taking time to visit with me today. Please don't hesitate to contact me if I can be of assistance to you before our next scheduled appointment.  Our next appointment is by telephone on 02/17/24 at 1pm Please call the care guide team at [phone] if you need to cancel or reschedule your appointment.   Following is a copy of your care plan:   Goals Addressed             This Visit's Progress    VBCI Social Work Care Plan       Problems:   Lacks knowledge of how to connect to community resources to assist with utilities as well as options to assist with work place challenges  CSW Clinical Goal(s):   Over the next 90 days the Patient will work with BSW to provide any additional resources related to assistance  utility payments.   Interventions:  Social Determinants of Health in Patient with DMII: SDOH assessments completed: Corporate treasurer , Geophysicist/field seismologist , and Housing  Evaluation of current treatment plan related to unmet needs Confirmed that patient utilizes local food pantries when needs Encouraged contacting the [employer], the Department of Social Services and local churches to request assistance with utility bills Work Place challenges discussed-encouraged follow up with [employer] and/or legal Aid for additional representation Emotional support provided  Motivational interviewing employed Problem Solving/task Centered strategies reviewed Positive reinforcement provided for coping strategies utilized ie considering alternative job opportunities CSW to refer patient to [employer] for additional community resources to address financial strain   Patient Goals/Self-Care Activities:  Solicitor the Department of Social Services and/or [employer]-local churches for assistance with utilities             Plan:   Telephone follow up appointment with care management team member scheduled for:  02/17/24        Please call the Suicide  and Crisis Lifeline: 988 if you are experiencing a Mental Health or Behavioral Health Crisis or need someone to talk to.  Patient verbalizes understanding of instructions and care plan provided today and agrees to view in MyChart. Active MyChart status and patient understanding of how to access instructions and care plan via MyChart confirmed with patient.     Veatrice Eckstein, LCSW [place]  [facility], [facility] Licensed Clinical Social Worker Care Coordinator  Direct Dial: [phone]

## 2024-02-02 ENCOUNTER — Other Ambulatory Visit: Payer: Self-pay

## 2024-02-02 NOTE — Patient Instructions (Signed)
 Visit Information  Thank you for taking time to visit with me today. Please don't hesitate to contact me if I can be of assistance to you before our next scheduled appointment.  Our next appointment is by telephone on 02/17/24 at 10:30am Please call the care guide team at [phone] if you need to cancel or reschedule your appointment.   Following is a copy of your care plan:   Goals Addressed   None     Please call the Suicide and Crisis Lifeline: 988 call the [place]  National Suicide Prevention Lifeline: 774 049 4425 or TTY: 478-809-2325 TTY 231-323-8344) to talk to a trained counselor call 1-800-273-TALK (toll free, 24 hour hotline) call 911 if you are experiencing a Mental Health or Behavioral Health Crisis or need someone to talk to.  The patient verbalized understanding of instructions, educational materials, and care plan provided today and DECLINED offer to receive copy of patient instructions, educational materials, and care plan.   Valora Gear, Florestine Hurl, MHA [place]  [facility] Social Worker, Population Health [phone]

## 2024-02-02 NOTE — Patient Outreach (Signed)
 Complex Care Management   Visit Note  02/02/2024  Name:  Ricardo Wood MRN: [id number] DOB: Apr 27, 1981  Situation: Referral received for Complex Care Management related to SDOH Barriers:  Financial Resource Strain I obtained verbal consent from Patient.  Visit completed with patient  on the phone  Background:   Past Medical History:  Diagnosis Date   Bipolar 1 disorder (HCC)    Diabetes mellitus without complication (HCC)    DVT (deep venous thrombosis) (HCC)    Kidney stones    Schizophrenia (HCC)    Seizures (HCC)     Assessment: SW completed a telephone outreach with patient, he is in need of resources for utilities. Patient states he is working, however he has a cut off notice for utilities, patient states he has applied for foodstamps but denied due to being over income. Patient states his check are also getting garnished. SW and patient agreed for resources for utilities to be emailed to address on file. Recommendation:   No recommendations at this time.  Follow Up Plan:   Telephone follow-up on 02/17/24 at 10:30am.  Valora Gear, BSW, MHA [place]  [facility] Social Worker, Population Health [phone]

## 2024-02-03 ENCOUNTER — Encounter: Payer: Self-pay | Admitting: Nurse Practitioner

## 2024-02-03 ENCOUNTER — Ambulatory Visit (INDEPENDENT_AMBULATORY_CARE_PROVIDER_SITE_OTHER): Admitting: Nurse Practitioner

## 2024-02-03 ENCOUNTER — Other Ambulatory Visit: Payer: Self-pay

## 2024-02-03 VITALS — BP 133/84 | HR 88 | Ht 72.0 in | Wt 320.2 lb

## 2024-02-03 DIAGNOSIS — Z7985 Long-term (current) use of injectable non-insulin antidiabetic drugs: Secondary | ICD-10-CM | POA: Diagnosis not present

## 2024-02-03 DIAGNOSIS — R21 Rash and other nonspecific skin eruption: Secondary | ICD-10-CM

## 2024-02-03 DIAGNOSIS — E119 Type 2 diabetes mellitus without complications: Secondary | ICD-10-CM | POA: Diagnosis not present

## 2024-02-03 MED ORDER — TRIAMCINOLONE ACETONIDE 0.1 % EX CREA
1.0000 | TOPICAL_CREAM | Freq: Two times a day (BID) | CUTANEOUS | 0 refills | Status: DC
  Filled 2024-02-03: qty 80, 40d supply, fill #0

## 2024-02-03 MED ORDER — CETIRIZINE HCL 10 MG PO TABS
10.0000 mg | ORAL_TABLET | Freq: Every day | ORAL | 11 refills | Status: DC
  Filled 2024-02-03: qty 30, 30d supply, fill #0

## 2024-02-03 NOTE — Progress Notes (Signed)
 BP 133/84 (BP Location: Right Arm, Patient Position: Sitting, Cuff Size: Large)   Pulse 88   Ht 6' (1.829 m)   Wt (!) 320 lb 3.2 oz (145.2 kg)   SpO2 97%   BMI 43.43 kg/m    Subjective:    Patient ID: Ricardo Wood, male    DOB: October 14, 1980, 43 y.o.   MRN: [id number]  HPI: Ricardo Wood is a 43 y.o. male  Chief Complaint  Patient presents with   Diabetes   Hypertension   Rash    Possible allergic reaction. Left arm. Redness and itching.    DIABETES Was able to pick up his medications.  Tolerating his medications well.   Hypoglycemic episodes:no Polydipsia/polyuria: yes Visual disturbance: no Chest pain: no Paresthesias: no Glucose Monitoring: yes  Accucheck frequency: Daily  Fasting glucose: 200-300  Post prandial:  Evening:  Before meals: Taking Insulin ?: no  Long acting insulin :  Short acting insulin :  He was able to get all of his prescriptions besides the eliquis .  He forgot about getting it and hasn't been able to pick it up.    Patient states he woke up this morning with a rash on his left arm.  Raised, red bumps that are itching.  Hasn't changed any soaps lotions or detergents.  Medications are the same as what he has been taking.  No shortness of breath or difficulty swallowing.   Relevant past medical, surgical, family and social history reviewed and updated as indicated. Interim medical history since our last visit reviewed. Allergies and medications reviewed and updated.  Review of Systems  HENT:  Negative for trouble swallowing.   Eyes:  Negative for visual disturbance.  Respiratory:  Negative for chest tightness and shortness of breath.   Cardiovascular:  Negative for chest pain, palpitations and leg swelling.  Endocrine: Negative for polydipsia and polyuria.  Skin:  Positive for rash.  Neurological:  Negative for dizziness, light-headedness, numbness and headaches.    Per HPI unless specifically indicated above     Objective:      BP 133/84 (BP Location: Right Arm, Patient Position: Sitting, Cuff Size: Large)   Pulse 88   Ht 6' (1.829 m)   Wt (!) 320 lb 3.2 oz (145.2 kg)   SpO2 97%   BMI 43.43 kg/m   Wt Readings from Last 3 Encounters:  02/03/24 (!) 320 lb 3.2 oz (145.2 kg)  01/18/24 (!) 315 lb 3.2 oz (143 kg)  01/09/24 (!) 308 lb (139.7 kg)    Physical Exam Vitals and nursing note reviewed.  Constitutional:      General: He is not in acute distress.    Appearance: Normal appearance. He is obese. He is not ill-appearing, toxic-appearing or diaphoretic.  HENT:     Head: Normocephalic.     Right Ear: External ear normal.     Left Ear: External ear normal.     Nose: Nose normal. No congestion or rhinorrhea.     Mouth/Throat:     Mouth: Mucous membranes are moist.  Eyes:     General:        Right eye: No discharge.        Left eye: No discharge.     Extraocular Movements: Extraocular movements intact.     Conjunctiva/sclera: Conjunctivae normal.     Pupils: Pupils are equal, round, and reactive to light.  Cardiovascular:     Rate and Rhythm: Normal rate and regular rhythm.     Heart sounds: No murmur heard.  Pulmonary:     Effort: Pulmonary effort is normal. No respiratory distress.     Breath sounds: Normal breath sounds. No wheezing, rhonchi or rales.  Abdominal:     General: Abdomen is flat. Bowel sounds are normal.  Musculoskeletal:     Cervical back: Normal range of motion and neck supple.  Skin:    General: Skin is warm and dry.     Capillary Refill: Capillary refill takes less than 2 seconds.       Neurological:     General: No focal deficit present.     Mental Status: He is alert and oriented to person, place, and time.  Psychiatric:        Mood and Affect: Mood normal.        Behavior: Behavior normal.        Thought Content: Thought content normal.        Judgment: Judgment normal.     Results for orders placed or performed in visit on 01/18/24  CBC w/Diff   Collection Time:  01/18/24  1:38 PM  Result Value Ref Range   WBC 7.6 3.4 - 10.8 x10E3/uL   RBC 5.33 4.14 - 5.80 x10E6/uL   Hemoglobin 15.6 13.0 - 17.7 g/dL   Hematocrit 19.1 47.8 - 51.0 %   MCV 92 79 - 97 fL   MCH 29.3 26.6 - 33.0 pg   MCHC 32.0 31.5 - 35.7 g/dL   RDW 29.5 62.1 - 30.8 %   Platelets 206 150 - 450 x10E3/uL   Neutrophils 43 Not Estab. %   Lymphs 46 Not Estab. %   Monocytes 7 Not Estab. %   Eos 3 Not Estab. %   Basos 1 Not Estab. %   Neutrophils Absolute 3.2 1.4 - 7.0 x10E3/uL   Lymphocytes Absolute 3.5 (H) 0.7 - 3.1 x10E3/uL   Monocytes Absolute 0.5 0.1 - 0.9 x10E3/uL   EOS (ABSOLUTE) 0.2 0.0 - 0.4 x10E3/uL   Basophils Absolute 0.1 0.0 - 0.2 x10E3/uL   Immature Granulocytes 0 Not Estab. %   Immature Grans (Abs) 0.0 0.0 - 0.1 x10E3/uL  Comp Met (CMET)   Collection Time: 01/18/24  1:38 PM  Result Value Ref Range   Glucose 371 (H) 70 - 99 mg/dL   BUN 12 6 - 24 mg/dL   Creatinine, Ser 6.57 0.76 - 1.27 mg/dL   eGFR 846 >96 EX/BMW/4.13   BUN/Creatinine Ratio 14 9 - 20   Sodium 136 134 - 144 mmol/L   Potassium 4.5 3.5 - 5.2 mmol/L   Chloride 97 96 - 106 mmol/L   CO2 25 20 - 29 mmol/L   Calcium  9.6 8.7 - 10.2 mg/dL   Total Protein 6.9 6.0 - 8.5 g/dL   Albumin 4.2 4.1 - 5.1 g/dL   Globulin, Total 2.7 1.5 - 4.5 g/dL   Bilirubin Total 0.4 0.0 - 1.2 mg/dL   Alkaline Phosphatase 91 44 - 121 IU/L   AST 23 0 - 40 IU/L   ALT 43 0 - 44 IU/L      Assessment & Plan:   Problem List Items Addressed This Visit       Endocrine   Diabetes mellitus treated with injections of non-insulin  medication (HCC) - Primary   Chronic.  Ongoing.  Patient was able to pick up medications.  Sugars are running 200-300.  He is tolerating his medications well.  Due for Ozempic  tonight.  Will increase to 0.5mg  weekly in two weeks.  Follow up in 1 month.  Call  sooner if concerns arise.      Other Visit Diagnoses       Rash       Will avoid prednisone  given patient's blood sugars.  Will treat with Zyrtec  and Triamcinalon cream.  Follow up if symptoms are not improved.        Follow up plan: Return in about 1 month (around 03/05/2024) for HTN, HLD, DM2 FU.   A total of 30 minutes were spent on this encounter today.  When total time is documented, this includes both the face-to-face and non-face-to-face time personally spent before, during and after the visit on the date of the encounter symptoms, medications, plan of care and follow up.

## 2024-02-03 NOTE — Assessment & Plan Note (Signed)
 Chronic.  Ongoing.  Patient was able to pick up medications.  Sugars are running 200-300.  He is tolerating his medications well.  Due for Ozempic  tonight.  Will increase to 0.5mg  weekly in two weeks.  Follow up in 1 month.  Call sooner if concerns arise.

## 2024-02-07 NOTE — Addendum Note (Signed)
 Addended by: Aileen Alexanders on: 02/07/2024 08:07 AM   Modules accepted: Level of Service

## 2024-02-08 ENCOUNTER — Ambulatory Visit: Admitting: Nurse Practitioner

## 2024-02-14 ENCOUNTER — Telehealth: Payer: Self-pay | Admitting: Nurse Practitioner

## 2024-02-14 ENCOUNTER — Other Ambulatory Visit: Payer: Self-pay

## 2024-02-14 MED ORDER — INSULIN GLARGINE-YFGN 100 UNIT/ML ~~LOC~~ SOPN
40.0000 [IU] | PEN_INJECTOR | Freq: Every day | SUBCUTANEOUS | 1 refills | Status: DC
  Filled 2024-02-14: qty 10, 25d supply, fill #0
  Filled 2024-02-14: qty 15, 37d supply, fill #0
  Filled 2024-04-03: qty 15, 37d supply, fill #1

## 2024-02-14 NOTE — Telephone Encounter (Signed)
 Was contacted by [facility] pharmacy for new prescription of Semglee .

## 2024-02-16 ENCOUNTER — Other Ambulatory Visit: Payer: Self-pay

## 2024-02-17 ENCOUNTER — Other Ambulatory Visit: Payer: Self-pay

## 2024-02-17 ENCOUNTER — Other Ambulatory Visit: Payer: Self-pay | Admitting: *Deleted

## 2024-02-17 ENCOUNTER — Encounter: Payer: Self-pay | Admitting: *Deleted

## 2024-02-17 NOTE — Patient Instructions (Signed)

## 2024-02-17 NOTE — Patient Outreach (Signed)
 Complex Care Management   Visit Note  02/17/2024  Name:  Ricardo Wood MRN: [id number] DOB: [birth year]  Situation: Referral received for Complex Care Management related to SDOH Barriers:  Food insecurity Lack of essential utilities [place] Financial Resource Strain I obtained verbal consent from Patient.  Visit completed with patient  on the phone  Background:   Past Medical History:  Diagnosis Date   Bipolar 1 disorder (HCC)    Diabetes mellitus without complication (HCC)    DVT (deep venous thrombosis) (HCC)    Kidney stones    Schizophrenia (HCC)    Seizures (HCC)     Assessment: SW completed a telephone outreach with patient. The resources SW emailed were received. Patient has tried some of the resources, but they did not have any funds. SW encouraged patient to continue to try. No other resources are needed at this time.   SDOH Interventions    Flowsheet Row Patient Outreach Telephone from 01/28/2024 in [place] [facility] DEPARTMENT  SDOH Interventions   Food Insecurity Interventions Other (Comment)  [utilizes the food banks in area]  Housing Interventions Other (Comment)  [making efforts to find affordable housing]  Transportation Interventions Intervention Not Indicated  Utilities Interventions AMB Referral       Recommendation:   No recommendations at this time  Follow Up Plan:   Patient has met all care management goals. Care Management case will be closed. Patient has been provided contact information should new needs arise.   Valora Gear, Florestine Hurl, MHA [place]  [facility] Social Worker, Population Health [phone]

## 2024-02-18 NOTE — Patient Instructions (Signed)
 Visit Information  Thank you for taking time to visit with me today. Please don't hesitate to contact me if I can be of assistance to you before our next scheduled appointment.   Patient has met all care management goals. Care Management case will be closed. Patient has been provided contact information should new needs arise.   Please call the care guide team at [phone] if you need to cancel, schedule, or reschedule an appointment.   Please call the Suicide and Crisis Lifeline: 988 if you are experiencing a Mental Health or Behavioral Health Crisis or need someone to talk to.  Ehan Freas, LCSW [place]  [facility], [facility] Licensed Clinical Social Worker Care Coordinator  Direct Dial: [phone]

## 2024-02-18 NOTE — Progress Notes (Signed)
 This encounter was created in error - please disregard.

## 2024-02-18 NOTE — Patient Outreach (Signed)
 Complex Care Management   Visit Note  02/18/2024  Name:  Ricardo Wood MRN: [id number] DOB: 1981/04/08  Situation: Referral received for Complex Care Management related to SDOH Barriers:  Housing   Food insecurity I obtained verbal consent from Patient.  Visit completed with patient  on the phone  Background:   Past Medical History:  Diagnosis Date   Bipolar 1 disorder (HCC)    Diabetes mellitus without complication (HCC)    DVT (deep venous thrombosis) (HCC)    Kidney stones    Schizophrenia (HCC)    Seizures (HCC)     Assessment: Patient Reported Symptoms:  Cognitive Cognitive Status: Alert and oriented to person, place, and time, Normal speech and language skills, Insightful and able to interpret abstract concepts Cognitive/Intellectual Conditions Management [RPT]: None reported or documented in medical history or problem list      Neurological Neurological Review of Symptoms: No symptoms reported    HEENT HEENT Symptoms Reported: No symptoms reported      Cardiovascular Cardiovascular Symptoms Reported: No symptoms reported    Respiratory Respiratory Symptoms Reported: No symptoms reported    Endocrine Patient reports the following symptoms related to hypoglycemia or hyperglycemia : No symptoms reported Is patient diabetic?: Yes Is patient checking blood sugars at home?: No (no, per patient) Endocrine Conditions: Diabetes Endocrine Management Strategies: Medication therapy  Gastrointestinal Gastrointestinal Symptoms Reported: Diarrhea Additional Gastrointestinal Details: Diarrhea becomes less frequent- Gastrointestinal Conditions: Diarrhea    Genitourinary Genitourinary Symptoms Reported: Frequency    Integumentary Integumentary Symptoms Reported: No symptoms reported    Musculoskeletal Musculoskelatal Symptoms Reviewed: No symptoms reported        Psychosocial Psychosocial Symptoms Reported: No symptoms reported Additional Psychological Details: Patient  confirms history of Bi-polar disorder and schizophrenia. patient continues to deny need for mental health follow up, reports being able to self manage his condition Behavioral Health Conditions: Bipolar affective disorder (schizophrenia) Other Behavorial Health Conditions: schizophrenia Behavioral Management Strategies: Adequate rest, Coping strategies, Support system Major Change/Loss/Stressor/Fears (CP): Resources Behaviors When Feeling Stressed/Fearful: listens to music, distracts with a task or hobby Techniques to Cope with Loss/Stress/Change: Diversional activities, Withdraw Quality of Family Relationships: supportive Do you feel physically threatened by others?: No      01/28/2024    9:31 AM  Depression screen PHQ 2/9  Decreased Interest 0  Down, Depressed, Hopeless 0  PHQ - 2 Score 0    There were no vitals filed for this visit.  Medications Reviewed Today     Reviewed by Ave Leisure, LCSW (Social Worker) on 02/17/24 at 1356  Med List Status: <None>   Medication Order Taking? Sig Documenting Provider Last Dose Status Informant  apixaban  (ELIQUIS ) 5 MG TABS tablet [id number] No Take 1 tablet (5 mg total) by mouth 2 (two) times daily.  Patient not taking: Reported on 02/17/2024   Aileen Alexanders, NP Not Taking Active   Blood Glucose Monitoring Suppl (BLOOD GLUCOSE MONITOR SYSTEM) w/Device KIT [id number] Yes Use to check blood sugar in the morning, at noon, and at bedtime. Aileen Alexanders, NP Taking Active   cetirizine  (ZYRTEC ) 10 MG tablet [id number] Yes Take 1 tablet (10 mg total) by mouth daily. Aileen Alexanders, NP Taking Active   glucose blood (FREESTYLE LITE) test strip [id number] Yes Use to check blood sugar in the morning, at noon, and at bedtime  Taking Active   insulin  glargine-yfgn (SEMGLEE , YFGN,) 100 UNIT/ML Pen [id number] Yes Inject 40 Units into the skin at bedtime. Aileen Alexanders, NP Taking Active  Insulin  Pen Needle (BD PEN NEEDLE NANO 2ND GEN) 32G X 4 MM  MISC [id number] Yes 1 each by Does not apply route daily at 2 PM. Aileen Alexanders, NP Taking Active   Lancets (FREESTYLE) lancets [id number] Yes Use to check blood sugar in the morning, at noon, and at bedtime  Taking Active   metFORMIN  (GLUCOPHAGE -XR) 500 MG 24 hr tablet [id number] Yes Take 2 tablets (1,000 mg total) by mouth 2 (two) times daily with a meal. Aileen Alexanders, NP Taking Active   olmesartan  (BENICAR ) 20 MG tablet [id number] Yes Take 1 tablet (20 mg total) by mouth daily. Aileen Alexanders, NP Taking Active   rosuvastatin  (CRESTOR ) 20 MG tablet [id number] Yes Take 1 tablet (20 mg total) by mouth daily. Aileen Alexanders, NP Taking Active   Semaglutide ,0.25 or 0.5MG /DOS, (OZEMPIC , 0.25 OR 0.5 MG/DOSE,) 2 MG/3ML SOPN [id number] Yes Inject 0.25 mg into the skin once a week. Start with 0.25MG  once a week for 4 weeks, then increase to 0.5MG  weekly. Aileen Alexanders, NP Taking Active            Med Note (GENTRY, CHERYL A   Wed Jan 26, 2024 10:45 AM)    triamcinolone  cream (KENALOG ) 0.1 % [id number] Yes Apply 1 Application topically 2 (two) times daily. Aileen Alexanders, NP Taking Active             Recommendation:   PCP Follow-up  Follow Up Plan:   Patient has met all care management goals. Care Management case will be closed. Patient has been provided contact information should new needs arise.   Kinlie Janice, LCSW [place]  [facility], [facility] Licensed Clinical Social Worker Care Coordinator  Direct Dial: [phone]

## 2024-02-22 ENCOUNTER — Other Ambulatory Visit: Payer: Self-pay

## 2024-02-22 ENCOUNTER — Emergency Department

## 2024-02-22 ENCOUNTER — Emergency Department
Admission: EM | Admit: 2024-02-22 | Discharge: 2024-02-22 | Disposition: A | Discharge status: Home / Self Care | Attending: Emergency Medicine | Admitting: Emergency Medicine

## 2024-02-22 DIAGNOSIS — N200 Calculus of kidney: Secondary | ICD-10-CM | POA: Diagnosis not present

## 2024-02-22 DIAGNOSIS — R0789 Other chest pain: Secondary | ICD-10-CM | POA: Insufficient documentation

## 2024-02-22 DIAGNOSIS — R1084 Generalized abdominal pain: Secondary | ICD-10-CM | POA: Insufficient documentation

## 2024-02-22 DIAGNOSIS — Z86718 Personal history of other venous thrombosis and embolism: Secondary | ICD-10-CM | POA: Insufficient documentation

## 2024-02-22 DIAGNOSIS — E1165 Type 2 diabetes mellitus with hyperglycemia: Secondary | ICD-10-CM | POA: Diagnosis not present

## 2024-02-22 DIAGNOSIS — R197 Diarrhea, unspecified: Secondary | ICD-10-CM | POA: Insufficient documentation

## 2024-02-22 DIAGNOSIS — R079 Chest pain, unspecified: Secondary | ICD-10-CM | POA: Diagnosis not present

## 2024-02-22 LAB — BASIC METABOLIC PANEL WITH GFR
Anion gap: 12 (ref 5–15)
BUN: 14 mg/dL (ref 6–20)
CO2: 26 mmol/L (ref 22–32)
Calcium: 9.2 mg/dL (ref 8.9–10.3)
Chloride: 101 mmol/L (ref 98–111)
Creatinine, Ser: 0.85 mg/dL (ref 0.61–1.24)
GFR, Estimated: >60 mL/min (ref >60)
Glucose, Bld: 234 mg/dL — ABNORMAL HIGH (ref 70–99)
Potassium: 4.2 mmol/L (ref 3.5–5.1)
Sodium: 139 mmol/L (ref 135–145)

## 2024-02-22 LAB — CBC
HCT: 46.2 % (ref 39.0–52.0)
Hemoglobin: 15.2 g/dL (ref 13.0–17.0)
MCH: 29.8 pg (ref 26.0–34.0)
MCHC: 32.9 g/dL (ref 30.0–36.0)
MCV: 90.6 fL (ref 80.0–100.0)
Platelets: 198 10*3/uL (ref 150–400)
RBC: 5.1 MIL/uL (ref 4.22–5.81)
RDW: 12.3 % (ref 11.5–15.5)
WBC: 9.6 10*3/uL (ref 4.0–10.5)
nRBC: 0 % (ref 0.0–0.2)

## 2024-02-22 LAB — HEPATIC FUNCTION PANEL
ALT: 26 U/L (ref 0–44)
AST: 20 U/L (ref 15–41)
Albumin: 4 g/dL (ref 3.5–5.0)
Alkaline Phosphatase: 69 U/L (ref 38–126)
Bilirubin, Direct: <0.1 mg/dL (ref 0.0–0.2)
Total Bilirubin: 0.6 mg/dL (ref 0.0–1.2)
Total Protein: 7.6 g/dL (ref 6.5–8.1)

## 2024-02-22 LAB — LIPASE, BLOOD: Lipase: 68 U/L — ABNORMAL HIGH (ref 11–51)

## 2024-02-22 LAB — TROPONIN I (HIGH SENSITIVITY)
Troponin I (High Sensitivity): 4 ng/L (ref <18)
Troponin I (High Sensitivity): 4 ng/L (ref <18)

## 2024-02-22 LAB — C DIFFICILE QUICK SCREEN W PCR REFLEX
C Diff antigen: NEGATIVE
C Diff interpretation: NOT DETECTED
C Diff toxin: NEGATIVE

## 2024-02-22 MED ORDER — ONDANSETRON 4 MG PO TBDP
4.0000 mg | ORAL_TABLET | Freq: Three times a day (TID) | ORAL | 0 refills | Status: DC | PRN
  Filled 2024-02-22: qty 20, 7d supply, fill #0

## 2024-02-22 MED ORDER — LACTATED RINGERS IV BOLUS
1000.0000 mL | Freq: Once | INTRAVENOUS | Status: AC
  Administered 2024-02-22: 1000 mL via INTRAVENOUS

## 2024-02-22 MED ORDER — IOHEXOL 350 MG/ML SOLN
100.0000 mL | Freq: Once | INTRAVENOUS | Status: AC | PRN
  Administered 2024-02-22: 100 mL via INTRAVENOUS

## 2024-02-22 NOTE — ED Notes (Signed)
 Pt refused to be on cardiac monitor because the leads "mess with his skin".

## 2024-02-22 NOTE — ED Notes (Signed)
 Pt transported to CT at this time.

## 2024-02-22 NOTE — ED Provider Notes (Signed)
 [facility] Provider Note    Event Date/Time   First MD Initiated Contact with Patient 02/22/24 1654     (approximate)   History   Diarrhea and Chest Pain   HPI  Ricardo Wood is a 43 y.o. male who presents to the ED for evaluation of Diarrhea and Chest Pain   Review a medical DC summary from 4/15.  Obese patient with history of DM, DVT, schizophrenia and noncompliant with anticoagulation.  Admitted for SIRS criteria without clear source, back pain with negative MRIs for infectious etiology.  Patient presents to the ED for evaluation burning chest discomfort today superimposed on about 4 days of persistent watery diarrhea.  Estimating 10-15 episodes of watery stool per day for the past few days without emesis or abdominal pain.  Lesser urinary output without dysuria.  Today though he developed burning chest discomfort and nausea, concern for dehydration so he presents to the ED.  No dizziness or syncope, no cough or fever.  Had antibiotics during admission last month but none since then.   Physical Exam   Triage Vital Signs: ED Triage Vitals  Encounter Vitals Group     BP 02/22/24 1543 (!) 144/92     Systolic BP Percentile --      Diastolic BP Percentile --      Pulse Rate 02/22/24 1543 (!) 106     Resp 02/22/24 1543 18     Temp 02/22/24 1543 98.5 F (36.9 C)     Temp Source 02/22/24 1543 Oral     SpO2 02/22/24 1543 97 %     Weight 02/22/24 1544 (!) 320 lb (145.2 kg)     Height 02/22/24 1544 6' (1.829 m)     Head Circumference --      Peak Flow --      Pain Score 02/22/24 1544 0     Pain Loc --      Pain Education --      Exclude from Growth Chart --     Most recent vital signs: Vitals:   02/22/24 1543  BP: (!) 144/92  Pulse: (!) 106  Resp: 18  Temp: 98.5 F (36.9 C)  SpO2: 97%    General: Awake, no distress.  Generally well-appearing CV:  Good peripheral perfusion.  Tachycardic and regular Resp:  Normal effort.  Abd:  No  distention.  Mild and diffuse tenderness throughout without guarding or peritoneal features. MSK:  No deformity noted.  Neuro:  No focal deficits appreciated. Other:     ED Results / Procedures / Treatments   Labs (all labs ordered are listed, but only abnormal results are displayed) Labs Reviewed  BASIC METABOLIC PANEL WITH GFR - Abnormal; Notable for the following components:      Result Value   Glucose, Bld 234 (*)    All other components within normal limits  LIPASE, BLOOD - Abnormal; Notable for the following components:   Lipase 68 (*)    All other components within normal limits  C DIFFICILE QUICK SCREEN W PCR REFLEX    CBC  HEPATIC FUNCTION PANEL  TROPONIN I (HIGH SENSITIVITY)  TROPONIN I (HIGH SENSITIVITY)    EKG Sinus rhythm with a rate of 99 bpm.  Normal axis and intervals without clear signs of acute ischemia.  RADIOLOGY CXR interpreted by me without evidence of acute cardiopulmonary pathology. CTA chest interpreted by me without signs of PE CT abdomen/pelvis interpreted by me without signs of acute pathology  Official radiology report(s):  CT Angio Chest PE W and/or Wo Contrast Result Date: 02/22/2024 CLINICAL DATA:  Diarrhea.  Chest pain. EXAM: CT ANGIOGRAPHY CHEST CT ABDOMEN AND PELVIS WITH CONTRAST TECHNIQUE: Multidetector CT imaging of the chest was performed using the standard protocol during bolus administration of intravenous contrast. Multiplanar CT image reconstructions and MIPs were obtained to evaluate the vascular anatomy. Multidetector CT imaging of the abdomen and pelvis was performed using the standard protocol during bolus administration of intravenous contrast. RADIATION DOSE REDUCTION: This exam was performed according to the departmental dose-optimization program which includes automated exposure control, adjustment of the mA and/or kV according to patient size and/or use of iterative reconstruction technique. CONTRAST:  OMNIPAQUE  IOHEXOL  350  MG/ML SOLN COMPARISON:  09/08/2023 100 and 01/08/2024. FINDINGS: CTA CHEST FINDINGS Cardiovascular: Satisfactory opacification of the pulmonary arteries to the segmental level. No evidence of pulmonary embolism. Normal heart size. No pericardial effusion. Mediastinum/Nodes: No enlarged mediastinal, hilar, or axillary lymph nodes. Thyroid gland, trachea, and esophagus demonstrate no significant findings. Lungs/Pleura: Lungs are clear. No pleural effusion or pneumothorax. Musculoskeletal: No chest wall abnormality. No acute or significant osseous findings. Review of the MIP images confirms the above findings. CT ABDOMEN and PELVIS FINDINGS Hepatobiliary: No focal liver abnormality is seen. No gallstones, gallbladder wall thickening, or biliary dilatation. Pancreas: Unremarkable. No pancreatic ductal dilatation or surrounding inflammatory changes. Spleen: Normal in size without focal abnormality. Adrenals/Urinary Tract: Adrenal glands are unremarkable. Right kidney midpole 5 and 3 mm stones. No hydronephrosis. No renal parenchymal abnormalities. Unremarkable urinary bladder. Stomach/Bowel: Stomach is within normal limits. Appendix appears normal. No evidence of bowel wall thickening, distention, or inflammatory changes. Vascular/Lymphatic: IVC filter in place. No enlarged abdominal or pelvic lymph nodes. Reproductive: Prostate is unremarkable. Other: No abdominal wall hernia or abnormality. No abdominopelvic ascites. Musculoskeletal: Thoracolumbosacral degenerative changes. The no acute or significant osseous findings. Review of the MIP images confirms the above findings. IMPRESSION: 1. No evidence of PE or acute chest disease. 2. Right kidney stones. No acute abdominal or pelvic pathology identified. Electronically Signed   By: Sydell Eva M.D.   On: 02/22/2024 19:14   CT ABDOMEN PELVIS W CONTRAST Result Date: 02/22/2024 CLINICAL DATA:  Diarrhea.  Chest pain. EXAM: CT ANGIOGRAPHY CHEST CT ABDOMEN AND PELVIS  WITH CONTRAST TECHNIQUE: Multidetector CT imaging of the chest was performed using the standard protocol during bolus administration of intravenous contrast. Multiplanar CT image reconstructions and MIPs were obtained to evaluate the vascular anatomy. Multidetector CT imaging of the abdomen and pelvis was performed using the standard protocol during bolus administration of intravenous contrast. RADIATION DOSE REDUCTION: This exam was performed according to the departmental dose-optimization program which includes automated exposure control, adjustment of the mA and/or kV according to patient size and/or use of iterative reconstruction technique. CONTRAST:  OMNIPAQUE  IOHEXOL  350 MG/ML SOLN COMPARISON:  09/08/2023 100 and 01/08/2024. FINDINGS: CTA CHEST FINDINGS Cardiovascular: Satisfactory opacification of the pulmonary arteries to the segmental level. No evidence of pulmonary embolism. Normal heart size. No pericardial effusion. Mediastinum/Nodes: No enlarged mediastinal, hilar, or axillary lymph nodes. Thyroid gland, trachea, and esophagus demonstrate no significant findings. Lungs/Pleura: Lungs are clear. No pleural effusion or pneumothorax. Musculoskeletal: No chest wall abnormality. No acute or significant osseous findings. Review of the MIP images confirms the above findings. CT ABDOMEN and PELVIS FINDINGS Hepatobiliary: No focal liver abnormality is seen. No gallstones, gallbladder wall thickening, or biliary dilatation. Pancreas: Unremarkable. No pancreatic ductal dilatation or surrounding inflammatory changes. Spleen: Normal in size without focal abnormality.  Adrenals/Urinary Tract: Adrenal glands are unremarkable. Right kidney midpole 5 and 3 mm stones. No hydronephrosis. No renal parenchymal abnormalities. Unremarkable urinary bladder. Stomach/Bowel: Stomach is within normal limits. Appendix appears normal. No evidence of bowel wall thickening, distention, or inflammatory changes. Vascular/Lymphatic:  IVC filter in place. No enlarged abdominal or pelvic lymph nodes. Reproductive: Prostate is unremarkable. Other: No abdominal wall hernia or abnormality. No abdominopelvic ascites. Musculoskeletal: Thoracolumbosacral degenerative changes. The no acute or significant osseous findings. Review of the MIP images confirms the above findings. IMPRESSION: 1. No evidence of PE or acute chest disease. 2. Right kidney stones. No acute abdominal or pelvic pathology identified. Electronically Signed   By: Sydell Eva M.D.   On: 02/22/2024 19:14   DG Chest 2 View Result Date: 02/22/2024 CLINICAL DATA:  chest pressure EXAM: CHEST - 2 VIEW COMPARISON:  01/08/2024 chest radiograph. FINDINGS: Stable cardiomediastinal silhouette with normal heart size. No pneumothorax. No pleural effusion. No pulmonary edema. No acute consolidative airspace disease. Chronic mild curvilinear scarring in the lingula. IMPRESSION: No active cardiopulmonary disease. Electronically Signed   By: Levell Reach M.D.   On: 02/22/2024 17:43    PROCEDURES and INTERVENTIONS:  Procedures  Medications  lactated ringers  bolus 1,000 mL (1,000 mLs Intravenous New Bag/Given 02/22/24 1740)  iohexol  (OMNIPAQUE ) 350 MG/ML injection 100 mL (100 mLs Intravenous Contrast Given 02/22/24 1756)     IMPRESSION / MDM / ASSESSMENT AND PLAN / ED COURSE  I reviewed the triage vital signs and the nursing notes.  Differential diagnosis includes, but is not limited to, viral syndrome, enteritis, dehydration, AKI, PE, GERD  {Patient presents with symptoms of an acute illness or injury that is potentially life-threatening.  Patient with poor compliance to his Eliquis  and history of DVT presents with chest discomfort superimposed on a few days of watery diarrhea.  Noted to be tachycardic, otherwise reassuring vital signs.  Mild localized abdominal tenderness but reassuring serum workup with normal CBC, troponin, LFTs.  Mild hyperglycemia without acidosis.   Marginal elevation of lipase to the 60s.  We will CTA chest to evaluate for PE considering his history of VTE, poor compliance of Eliquis .  CT abdomen/pelvis due to his diarrhea and abdominal tenderness.  C. difficile pending considering recent admission for antibiotics  Troponins remain low, CTA chest and delayed abdomen/pelvis without evidence of acute pathology.  Negative C. difficile screen.  Looks well after IV fluids, tolerating p.o. intake, suitable for outpatient management.  Clinical Course as of 02/22/24 2107  Tue Feb 22, 2024  1938 Reassessed and discussed reassuring CT results.  He is provided a stool sample, awaiting C. difficile PCR screen.  He is asking about eating, which is fine [DS]    Clinical Course User Index [DS] Arline Bennett, MD     FINAL CLINICAL IMPRESSION(S) / ED DIAGNOSES   Final diagnoses:  Other chest pain  Diarrhea, unspecified type     Rx / DC Orders   ED Discharge Orders          Ordered    ondansetron  (ZOFRAN -ODT) 4 MG disintegrating tablet  Every 8 hours PRN        02/22/24 2046             Note:  This document was prepared using Dragon voice recognition software and may include unintentional dictation errors.   Arline Bennett, MD 02/22/24 2107

## 2024-02-22 NOTE — ED Triage Notes (Signed)
 Pt states for the past 3 days he has had constant diarrhea, with urgency, states today he's had pressure in his chest with some nausea

## 2024-02-23 ENCOUNTER — Other Ambulatory Visit: Payer: Self-pay

## 2024-03-06 ENCOUNTER — Other Ambulatory Visit: Payer: Self-pay

## 2024-03-06 MED ORDER — APIXABAN 5 MG PO TABS
5.0000 mg | ORAL_TABLET | Freq: Two times a day (BID) | ORAL | 0 refills | Status: DC
  Filled 2024-03-06 – 2024-04-03 (×2): qty 60, 30d supply, fill #0

## 2024-03-06 NOTE — Progress Notes (Signed)
   03/06/2024  Patient ID: Ricardo Wood, male   DOB: 1981/02/02, 43 y.o.   MRN: [id number]  Subjective/Objective Telephone visit to follow-up on medication management of chronic disease states   Diabetes: Current medications: Semglee  40 units daily, Ozempic  0.5mg  weekly, metformin  XR 1000mg  BID -Patient is out of Ozempic  and missed last weeks dose.  He has titrated to the 0.5mg  weekly dose but was recently seen in the ED for chest discomfort and diarrhea.  Diarrhea lasted approximately 10 days and occurred after second dose of Ozempic  0.5mg  weekly. -Endorses good adherence to Semglee  and metformin  -Endorses checking home FBG occasionally and last reading was 210 -Most recent A1c 12.3% on 3/28   Hypertension: Current medications: Olmesartan  20mg  -Patient endorses good adherence to olmesartan  -BP at OV 126/85 -Does not monitor home BP   Hyperlipidemia/ASCVD Risk Reduction Current lipid lowering medications: rosuvastatin  20mg  daily  -Endorses good adherence to rosuvastatin  -Recent LDL 141   COPD: Current medications: none -Breztri  prescribed earlier this year, and was going through for $0; but patient did not pick prescription up   Chronic DVT: -Recently prescribed Eliquis  5mg  BID; and with copay card, medication was going through for $10.   -Patient did pick Eliquis  up in May and endorses needing a refill, with 2-3 tablets on hand   Assessment/Plan:    Diabetes: -Currently uncontrolled -Recommend resuming Ozempic  at 0.25mg  weekly dose for another 4 weeks to verify tolerance- coordinating with pharmacy for refill (should have 1 remaining) -Continue Semglee  40 units daily and metformin  XR 1000mg  BID -Monitor and record FBG at least 3x/week as we adjust medications -Due for A1c later this month   Hypertension: -Currently moderately controlled -Continue olmesartan  20mg  daily   Hyperlipidemia/ASCVD Risk Reduction: -Currently uncontrolled.  -Continue rosuvastatin  20mg  daily   -Recommend follow-up LFT and lipid panel when next A1c is done; if LDL remains >70, consider increasing rosuvastatin  to 40mg  daily    COPD: -Currently controlled per recent d/c summary -If status were to decline, I recommend initiation of daily Breztri    Chronic DVT: -Pending refill for Eliquis  5mg  BID for PCP to sign if in agreement   Follow Up Plan: 4 weeks   Linn Rich, PharmD, DPLA

## 2024-03-07 ENCOUNTER — Ambulatory Visit: Admitting: Nurse Practitioner

## 2024-03-09 ENCOUNTER — Ambulatory Visit: Admitting: Nurse Practitioner

## 2024-03-09 NOTE — Progress Notes (Deleted)
 There were no vitals taken for this visit.   Subjective:    Patient ID: Ricardo Wood, male    DOB: September 03, 1981, 43 y.o.   MRN: [id number]  HPI: Ricardo Wood is a 43 y.o. male  No chief complaint on file.  DIABETES Was able to pick up his medications.  Tolerating his medications well.   Hypoglycemic episodes:no Polydipsia/polyuria: yes Visual disturbance: no Chest pain: no Paresthesias: no Glucose Monitoring: yes  Accucheck frequency: Daily  Fasting glucose: 200-300  Post prandial:  Evening:  Before meals: Taking Insulin ?: no  Long acting insulin :  Short acting insulin :   Relevant past medical, surgical, family and social history reviewed and updated as indicated. Interim medical history since our last visit reviewed. Allergies and medications reviewed and updated.  Review of Systems  Per HPI unless specifically indicated above     Objective:    There were no vitals taken for this visit.  Wt Readings from Last 3 Encounters:  02/22/24 (!) 320 lb (145.2 kg)  02/03/24 (!) 320 lb 3.2 oz (145.2 kg)  01/18/24 (!) 315 lb 3.2 oz (143 kg)    Physical Exam  Results for orders placed or performed during the hospital encounter of 02/22/24  Basic metabolic panel   Collection Time: 02/22/24  3:48 PM  Result Value Ref Range   Sodium 139 135 - 145 mmol/L   Potassium 4.2 3.5 - 5.1 mmol/L   Chloride 101 98 - 111 mmol/L   CO2 26 22 - 32 mmol/L   Glucose, Bld 234 (H) 70 - 99 mg/dL   BUN 14 6 - 20 mg/dL   Creatinine, Ser 4.09 0.61 - 1.24 mg/dL   Calcium  9.2 8.9 - 10.3 mg/dL   GFR, Estimated >81 >19 mL/min   Anion gap 12 5 - 15  CBC   Collection Time: 02/22/24  3:48 PM  Result Value Ref Range   WBC 9.6 4.0 - 10.5 K/uL   RBC 5.10 4.22 - 5.81 MIL/uL   Hemoglobin 15.2 13.0 - 17.0 g/dL   HCT 14.7 82.9 - 56.2 %   MCV 90.6 80.0 - 100.0 fL   MCH 29.8 26.0 - 34.0 pg   MCHC 32.9 30.0 - 36.0 g/dL   RDW 13.0 86.5 - 78.4 %   Platelets 198 150 - 400 K/uL   nRBC 0.0 0.0  - 0.2 %  Hepatic function panel   Collection Time: 02/22/24  3:48 PM  Result Value Ref Range   Total Protein 7.6 6.5 - 8.1 g/dL   Albumin 4.0 3.5 - 5.0 g/dL   AST 20 15 - 41 U/L   ALT 26 0 - 44 U/L   Alkaline Phosphatase 69 38 - 126 U/L   Total Bilirubin 0.6 0.0 - 1.2 mg/dL   Bilirubin, Direct <6.9 0.0 - 0.2 mg/dL   Indirect Bilirubin NOT CALCULATED 0.3 - 0.9 mg/dL  Lipase, blood   Collection Time: 02/22/24  3:48 PM  Result Value Ref Range   Lipase 68 (H) 11 - 51 U/L  Troponin I (High Sensitivity)   Collection Time: 02/22/24  3:48 PM  Result Value Ref Range   Troponin I (High Sensitivity) 4 <18 ng/L  C Difficile Quick Screen w PCR reflex   Collection Time: 02/22/24  5:43 PM   Specimen: STOOL  Result Value Ref Range   C Diff antigen NEGATIVE NEGATIVE   C Diff toxin NEGATIVE NEGATIVE   C Diff interpretation No C. difficile detected.   Troponin I (High Sensitivity)  Collection Time: 02/22/24  5:43 PM  Result Value Ref Range   Troponin I (High Sensitivity) 4 <18 ng/L      Assessment & Plan:   Problem List Items Addressed This Visit   None    Follow up plan: No follow-ups on file.

## 2024-03-13 ENCOUNTER — Ambulatory Visit: Payer: Self-pay | Admitting: *Deleted

## 2024-03-16 ENCOUNTER — Other Ambulatory Visit: Payer: Self-pay

## 2024-04-03 ENCOUNTER — Other Ambulatory Visit: Payer: Self-pay

## 2024-04-03 ENCOUNTER — Other Ambulatory Visit: Payer: Self-pay | Admitting: Nurse Practitioner

## 2024-04-03 MED ORDER — METFORMIN HCL ER 500 MG PO TB24
1000.0000 mg | ORAL_TABLET | Freq: Two times a day (BID) | ORAL | 0 refills | Status: DC
  Filled 2024-04-03: qty 360, 90d supply, fill #0

## 2024-04-03 MED ORDER — ROSUVASTATIN CALCIUM 20 MG PO TABS
20.0000 mg | ORAL_TABLET | Freq: Every day | ORAL | 0 refills | Status: DC
  Filled 2024-04-03: qty 90, 90d supply, fill #0

## 2024-04-03 MED ORDER — OLMESARTAN MEDOXOMIL 20 MG PO TABS
20.0000 mg | ORAL_TABLET | Freq: Every day | ORAL | 0 refills | Status: DC
  Filled 2024-04-03: qty 90, 90d supply, fill #0

## 2024-04-03 NOTE — Progress Notes (Signed)
   04/03/2024  Patient ID: [name], male   DOB: 25-May-1981, 43 y.o.   MRN: [id number]  Outreach attempt to inform patient that [facility] outpatient pharmacy is refilling all of his medications, and these are going through on insurance for $0.  I was not able to reach the patient, but I did leave a message with my direct phone number.  [name], PharmD, DPLA

## 2024-04-03 NOTE — Progress Notes (Signed)
   04/03/2024  Patient ID: Ricardo Wood, male   DOB: 1981-08-16, 43 y.o.   MRN: [id number]  Subjective/Objective Telephone visit to follow-up on medication management of chronic disease states.    Medication Access/adherence: Patient states he is out of all medications and has lost his insurance coverage   Diabetes: Current medications: Semglee  40 units daily, Ozempic  0.5mg  weekly, metformin  XR 1000mg  BID -Currently out of Ozempic  and Semglee ; taking metformin  but it is an old bottle patient had at home -Endorses FBG 250 and BG throughout the day as high as 570 -Last A1c 12.3% on 3/28   Hypertension: Current medications: Olmesartan  20mg  -Patient is out of olmesartan  20mg  -BP at OV 126/85 -Does not monitor home BP   Hyperlipidemia/ASCVD Risk Reduction Current lipid lowering medications: rosuvastatin  20mg  daily  -Out of rosuvastatin  20mg  -Recent LDL 141   COPD: Current medications: none -Breztri  prescribed earlier this year, and was going through for $0; but patient did not pick prescription up   Chronic DVT: -Current medications:  Eliquis  5mg  BID -Patient is out of Eliquis    Assessment/Plan:   Medication Access/adherence: Test claims reflect medications going through on insurance for $0 copays; contacted [facility] to process refills to verify; and insurance is active and covering claims.  Refills are needed for olmesartan , metformin , and rosuvastatin .  Diabetes: -Currently uncontrolled -Recommend resuming Ozempic  at 0.25mg  weekly dose for another 4 weeks to verify tolerance -Resume metformin  XR 1000mg  BID and Semglee  40 units at bedtime- refill pending for PCP to sign for metformin  -Monitor and record FBG at least 3x/week as we adjust medications -Due for A1c - advised patient to contact office for appointment   Hypertension: -Currently moderately controlled -Resume olmesartan  20mg  daily- prescription pending for PCP to sign   Hyperlipidemia/ASCVD Risk  Reduction: -Currently uncontrolled.  -Resume rosuvastatin  20mg  daily - prescription pending for PCP to sign -Recommend follow-up LFT and lipid panel when next A1c is done; if LDL remains >70, consider increasing rosuvastatin  to 40mg  daily    COPD: -Currently controlled per recent d/c summary -If status were to decline, I recommend initiation of daily Breztri    Chronic DVT: -Resume Eliquis  5mg  BID   Follow Up Plan: 4 weeks   [name], PharmD, DPLA

## 2024-04-14 ENCOUNTER — Other Ambulatory Visit: Payer: Self-pay

## 2024-04-14 NOTE — Progress Notes (Signed)
   04/14/2024  Patient ID: Ricardo Wood, male   DOB: [birth year], 43 y.o.   MRN: [id number]  Subjective/Objective Telephone visit to follow-up on medication management of chronic disease states.     Medication Access/adherence: -Patient states he did pick up all maintenance medication refills from 7/7 and is taking as prescribed at this time.  Everything processed through on insurance for $0 copays -Patient will be starting new job August 4th; will have insurance through new employer but does not believe eligible for coverage until 90d after starting   Diabetes: Current medications: Semglee  40 units daily, Ozempic  0.5mg  weekly, metformin  XR 1000mg  at bedtime -Patient cannot tolerate metformin  1000mg  BID due to diarrhea, so he is only taking 1000mg  in the evening once home -Endorses BG continues to be in the 290's, with lowest FBG of 198 -Last A1c 12.3% on 3/28   Hypertension: Current medications: Olmesartan  20mg  -BP at OV 126/85 -Does not monitor home BP, stating olmesartan  prescribed for renal protection versus BP control   Hyperlipidemia/ASCVD Risk Reduction Current lipid lowering medications: rosuvastatin  20mg  daily  -Recent LDL 141   COPD: Current medications: none -Breztri  prescribed earlier this year, and was going through for $0; but patient did not pick prescription up   Chronic DVT: -Current medications:  Eliquis  5mg  BID   Assessment/Plan:    Medication Access/adherence: -Follow-up in 3 weeks to verify patient will be able to refill all medications   Diabetes: -Uncontrolled -Increase Semglee  to 44 units at bedtime; hopefully Ozempic  can be increased to 1mg  in a few weeks -Monitor and record FBG at least 3x/week as we adjust medications -Due for A1c - advised patient to contact office for appointment   Hypertension: -Controlled -Continue current regimen at this time   Hyperlipidemia/ASCVD Risk Reduction: -Currently uncontrolled.  -Recommend follow-up LFT  and lipid panel when next A1c is done; if LDL remains >70, consider increasing rosuvastatin  to 40mg  daily    COPD: -Currently controlled per recent d/c summary -If status were to decline, I recommend initiation of daily Breztri    Chronic DVT: -Continue Eliquis  5mg  BID   Follow Up Plan: 3 weeks   [name], PharmD, DPLA

## 2024-05-05 ENCOUNTER — Other Ambulatory Visit: Payer: Self-pay

## 2024-05-05 NOTE — Progress Notes (Signed)
   05/05/2024  Patient ID: Ricardo Wood, male   DOB: [birth year], 43 y.o.   MRN: [id number]  Subjective/Objective Telephone visit to follow-up on medication management of chronic disease states.     Medication Access/adherence: -Patient states he did pick up all maintenance medication refills from 7/7 and is taking as prescribed at this time.  Everything processed through on insurance for $0 copays -Patient has started a new job this Month; will have insurance through new employer but does not believe eligible for coverage until 90d after starting   Diabetes: Current medications: Semglee  40 units daily, Ozempic  0.5mg  weekly, metformin  XR 1000mg  at bedtime -Patient cannot tolerate metformin  1000mg  BID due to diarrhea, so he is only taking 1000mg  in the evening once home -Did not increase Semglee  to 44 units daily; is running low on this medication, and there is not a refill remaining at the pharmacy -Patient is also needing a refill on Ozempic ; he is still experiencing some diarrhea on the 0.5mg  dose and would prefer to stay on this dose a while longer -Endorses BG continue to run in the 200's -Last A1c 12.3% on 3/28   Hypertension: Current medications: Olmesartan  20mg  -BP at OV 126/85 -Does not monitor home BP, stating olmesartan  prescribed for renal protection versus BP control   Hyperlipidemia/ASCVD Risk Reduction Current lipid lowering medications: rosuvastatin  20mg  daily  -Recent LDL 141   COPD: Current medications: none -Breztri  prescribed earlier this year, and was going through for $0; but patient did not pick prescription up   Chronic DVT: -Current medications:  Eliquis  5mg  BID -Needing a refill on Eliquis    Assessment/Plan:    Medication Access/adherence: -Follow-up in 3 weeks to verify patient will be able to refill all medications   Diabetes: -Uncontrolled -Increase Semglee  to 44 units at bedtime -refill with dose increase pending for PCP to sign; hopefully  Ozempic  can be increased to 1mg  in a few weeks.  Contacting pharmacy to refill Ozempic  0.5mg  weekly. -Monitor and record FBG at least 3x/week as we adjust medications -Due for A1c - advised patient to contact office for appointment   Hypertension: -Controlled -Continue current regimen at this time   Hyperlipidemia/ASCVD Risk Reduction: -Currently uncontrolled.  -Recommend follow-up LFT and lipid panel when next A1c is done; if LDL remains >70, consider increasing rosuvastatin  to 40mg  daily    COPD: -Currently controlled per recent d/c summary -If status were to decline, I recommend initiation of daily Breztri    Chronic DVT: -Continue Eliquis  5mg  BID- refill pending for PCP to sign if in agreement   Follow Up Plan: 3 weeks   Ricardo Wood, PharmD, DPLA

## 2024-05-06 ENCOUNTER — Other Ambulatory Visit: Payer: Self-pay

## 2024-05-06 MED ORDER — INSULIN GLARGINE-YFGN 100 UNIT/ML ~~LOC~~ SOPN
44.0000 [IU] | PEN_INJECTOR | Freq: Every day | SUBCUTANEOUS | 0 refills | Status: DC
  Filled 2024-05-06: qty 15, 34d supply, fill #0

## 2024-05-06 MED ORDER — APIXABAN 5 MG PO TABS
5.0000 mg | ORAL_TABLET | Freq: Two times a day (BID) | ORAL | 0 refills | Status: DC
  Filled 2024-05-06: qty 60, 30d supply, fill #0

## 2024-05-08 ENCOUNTER — Telehealth: Payer: Self-pay

## 2024-05-08 ENCOUNTER — Other Ambulatory Visit: Payer: Self-pay

## 2024-05-08 MED ORDER — LANTUS SOLOSTAR 100 UNIT/ML ~~LOC~~ SOPN
44.0000 [IU] | PEN_INJECTOR | Freq: Every day | SUBCUTANEOUS | 0 refills | Status: DC
  Filled 2024-05-08: qty 12, 27d supply, fill #0

## 2024-05-08 NOTE — Progress Notes (Signed)
   05/08/2024  Patient ID: Ricardo Wood, male   DOB: [birth year], 43 y.o.   MRN: [id number]  Prescriptions sent to pharmacy for 1 month refill on Eliquis  5mg  BID and Semglee , and I contacted pharmacy to refill Ozempic  0.5mg  weekly.  Patient's new insurance is active, and he has a $5000 deductible; but with a copay card, Eliquis  is going through for $10.  Insurance prefers Lantus  or Basaglar  versus Semglee  and copay will be $35; prescription pending for provider to sign for 1 month supply of Lantus .  Ozempic  requires a prior authorization, so that has been submitted to insurance via CoverMyMeds ([id number]).  I will follow-up on progress of PA and inform patient of the above information.  Ricardo Wood, PharmD, DPLA

## 2024-05-09 ENCOUNTER — Telehealth: Payer: Self-pay

## 2024-05-09 NOTE — Telephone Encounter (Signed)
 Ok for E2C2 to review.   Attempted to return call however ext [phone] is invalid when you call. If they return the call since this is his insurance company you can let them know Ozempic  is for diabetes treatment. If they have any other questions please transfer call.

## 2024-05-09 NOTE — Telephone Encounter (Signed)
 Copied from CRM [id number]. Topic: Clinical - Medication Question >> May 09, 2024 11:52 AM Kevelyn M wrote: Reason for CRM: Iris calling in to see if the Ozempic  will be used for type 2 Diabetes, because they did not receive any chart notes. She needs to verify why the patient is using Ozempic .  Call back # [phone] opt#[phone] There is a confidential vm

## 2024-05-10 ENCOUNTER — Telehealth: Payer: Self-pay

## 2024-05-10 NOTE — Progress Notes (Signed)
   05/10/2024  Patient ID: Ricardo Wood, male   DOB: 08-Jan-1981, 43 y.o.   MRN: [id number]  Patient's insurance needing additional information regarding diagnosis associated with Ozempic  PA requested via CoverMyMeds.  I have faxed over chart documentation reflecting Type 2 Diabetes diagnosis and will continue to monitor progress of PA.  Attempted to contact patient to follow-up regarding Lantus  (changed from Semglee  based on new insurance), Eliquis , and Ozempic ; but I was not able to reach him.  I did leave a HIPAA compliant voicemail with my direct phone number, though.  Ricardo Wood, PharmD, DPLA

## 2024-05-12 ENCOUNTER — Other Ambulatory Visit: Payer: Self-pay

## 2024-05-16 ENCOUNTER — Other Ambulatory Visit: Payer: Self-pay

## 2024-05-17 ENCOUNTER — Telehealth: Payer: Self-pay

## 2024-05-17 ENCOUNTER — Other Ambulatory Visit: Payer: Self-pay

## 2024-05-17 NOTE — Progress Notes (Unsigned)
   05/17/2024  Patient ID: Ricardo Wood, male   DOB: Mar 23, 1981, 43 y.o.   MRN: [id number]  Outreach attempt to telephone visit to follow-up on affordability of Ozempic  was not successful.  I was able to leave a HIPAA compliant voicemail along with my direct phone number and will try to call the patient again next week if I do not hear back.  [name], PharmD, DPLA

## 2024-05-17 NOTE — Progress Notes (Signed)
   05/17/2024  Patient ID: Ricardo Wood, male   DOB: Apr 22, 1981, 43 y.o.   MRN: [id number]  Contacted Medicaid to follow-up on prior authorization request submitted for Ozempic .  Patient only has family planning Medicaid, so this medication is not covered.  Medication has been approved on patient's current plan through [facility], but his copay reflects cost of >$900.  It does appear he was able to pick up Eliquis  and Lantus  this month, and he got 90d supplies of metformin , olmesartan , and rosuvastatin  on 7/7.  Unclear if current insurance is through his new job or intermittent coverage until new insurance through employer is active.  I contacted he patient, but he was in training and requested a call back after 3pm.  Telephone visit scheduled for 4pm today to discuss further.  He is also due for a follow-up with PCP, which I will make him aware of.  [name], PharmD, DPLA

## 2024-05-26 ENCOUNTER — Other Ambulatory Visit: Payer: Self-pay

## 2024-05-26 NOTE — Progress Notes (Signed)
   05/26/2024  Patient ID: Ricardo Wood, male   DOB: Jan 30, 1981, 43 y.o.   MRN: [id number]  Subjective/Objective Telephone visit to follow-up on medication management of chronic disease states.     Diabetes: Current medications: Lantus  44 units daily, metformin  XR 1000mg  at bedtime -Patient cannot tolerate metformin  1000mg  BID due to diarrhea, so he is only taking 1000mg  in the evening once home -Did increase long-acting insulin  to 44 units, but has not been able to get Ozempic  due to cost -Recently started a new job and will qualify for benefits after 90 days (approximately 2 months from now), but is on his wife's insurance in the meantime; and Ozempic  copay was going to be $900 -Endorses BG continue to run i200+ -Last A1c 12.3% on 3/28   Hyperlipidemia/ASCVD Risk Reduction Current lipid lowering medications: rosuvastatin  20mg  daily  -Recent LDL 141   Chronic DVT: -Current medications:  Eliquis  5mg  BID -Eliquis  was covered with new insurance and copay card for $10, so he has this medication and is taking as prescribed   Assessment/Plan:    Diabetes: -Uncontrolled -Increase Lantus  to 46 units daily -Schedule follow-up with PCP; due for A1c and we will need to get something else on board to help with control of diabetes at least until new insurance is effective (in approx 2 months) -Could consider mealtime insulin  (Humalog preferred by plan) or sulfonylurea (cheaper option) -Monitor and record FBG at least 3x/week as we adjust medications   Chronic DVT: -Continue Eliquis  5mg  BID   Follow Up Plan: I will follow-up with patient in approximately 2 months to see if insurance through new employer is active and will cover Ozempic  at affordable copay   [name], PharmD, DPLA

## 2024-05-30 ENCOUNTER — Telehealth: Payer: Self-pay | Admitting: Nurse Practitioner

## 2024-05-30 NOTE — Telephone Encounter (Signed)
 Called patient and left a message for him to call back to get scheduled.

## 2024-05-30 NOTE — Telephone Encounter (Signed)
-----   Message from Darice Petty sent at 05/30/2024  8:02 AM EDT ----- Can we call and schedule an appt for this patient?

## 2024-05-31 ENCOUNTER — Telehealth: Payer: Self-pay | Admitting: Nurse Practitioner

## 2024-05-31 NOTE — Telephone Encounter (Signed)
 Called patient and left a message for him to call back to get scheduled for any open appt

## 2024-05-31 NOTE — Telephone Encounter (Signed)
-----   Message from Darice Petty sent at 05/30/2024  8:02 AM EDT ----- Can we call and schedule an appt for this patient?

## 2024-06-14 ENCOUNTER — Other Ambulatory Visit: Payer: Self-pay | Admitting: Nurse Practitioner

## 2024-06-14 ENCOUNTER — Other Ambulatory Visit: Payer: Self-pay

## 2024-06-15 ENCOUNTER — Other Ambulatory Visit: Payer: Self-pay

## 2024-06-15 ENCOUNTER — Other Ambulatory Visit: Payer: Self-pay | Admitting: Nurse Practitioner

## 2024-06-16 ENCOUNTER — Other Ambulatory Visit: Payer: Self-pay

## 2024-06-16 MED FILL — Insulin Glargine Soln Pen-Injector 100 Unit/ML: SUBCUTANEOUS | 34 days supply | Qty: 15 | Fill #0 | Status: AC

## 2024-06-16 NOTE — Telephone Encounter (Signed)
 Requested Prescriptions  Pending Prescriptions Disp Refills   insulin  glargine (LANTUS  SOLOSTAR) 100 UNIT/ML Solostar Pen 15 mL 2    Sig: Inject 44 Units into the skin daily.     Endocrinology:  Diabetes - Insulins Failed - 06/16/2024 12:24 PM      Failed - HBA1C is between 0 and 7.9 and within 180 days    Hgb A1c MFr Bld  Date Value Ref Range Status  12/24/2023 12.3 (H) 4.8 - 5.6 % Final    Comment:             Prediabetes: 5.7 - 6.4          Diabetes: >6.4          Glycemic control for adults with diabetes: <7.0          Passed - Valid encounter within last 6 months    Recent Outpatient Visits           4 months ago Diabetes mellitus treated with injections of non-insulin  medication [facility])   [facility] Melvin Pao, NP   5 months ago Hospital discharge follow-up   [place] [facility] Melvin Pao, NP   5 months ago Type 2 diabetes mellitus with other specified complication, without long-term current use of insulin  [facility])   [place] [facility] Melvin Pao, NP   5 months ago Chronic obstructive pulmonary disease, unspecified COPD type [facility])   [place] [facility] Melvin Pao, NP

## 2024-06-20 ENCOUNTER — Other Ambulatory Visit (HOSPITAL_COMMUNITY): Payer: Self-pay

## 2024-07-24 ENCOUNTER — Other Ambulatory Visit: Payer: Self-pay | Admitting: Nurse Practitioner

## 2024-07-24 NOTE — Telephone Encounter (Signed)
 Prescription Request  07/24/2024  LOV: 02/03/2024  What is the name of the medication?  insulin  glargine (LANTUS  SOLOSTAR) 100 UNIT/ML Solostar Pen   Have you contacted your pharmacy to request a refill? No   Which pharmacy would you like this sent to?   [facility], [place]   Patient notified that their request is being sent to the clinical staff for review and that they should receive a response within 2 business days.   Please advise at Mobile [phone] (mobile)

## 2024-07-25 MED ORDER — LANTUS SOLOSTAR 100 UNIT/ML ~~LOC~~ SOPN: 44.0000 [IU] | PEN_INJECTOR | Freq: Every day | SUBCUTANEOUS | 0 refills | Status: DC

## 2024-07-25 NOTE — Telephone Encounter (Signed)
 Last RX was sent to [facility]. Can we resend to [facility] per patient request?

## 2024-07-27 ENCOUNTER — Emergency Department
Admission: EM | Admit: 2024-07-27 | Discharge: 2024-07-28 | Disposition: A | Discharge status: Home / Self Care | Attending: Emergency Medicine | Admitting: Emergency Medicine

## 2024-07-27 ENCOUNTER — Encounter: Payer: Self-pay | Admitting: *Deleted

## 2024-07-27 ENCOUNTER — Emergency Department

## 2024-07-27 ENCOUNTER — Other Ambulatory Visit: Payer: Self-pay

## 2024-07-27 DIAGNOSIS — Z7984 Long term (current) use of oral hypoglycemic drugs: Secondary | ICD-10-CM | POA: Insufficient documentation

## 2024-07-27 DIAGNOSIS — J9811 Atelectasis: Secondary | ICD-10-CM | POA: Diagnosis not present

## 2024-07-27 DIAGNOSIS — Z794 Long term (current) use of insulin: Secondary | ICD-10-CM | POA: Diagnosis not present

## 2024-07-27 DIAGNOSIS — R079 Chest pain, unspecified: Secondary | ICD-10-CM | POA: Diagnosis not present

## 2024-07-27 DIAGNOSIS — R739 Hyperglycemia, unspecified: Secondary | ICD-10-CM

## 2024-07-27 DIAGNOSIS — R0602 Shortness of breath: Secondary | ICD-10-CM | POA: Diagnosis not present

## 2024-07-27 DIAGNOSIS — E1165 Type 2 diabetes mellitus with hyperglycemia: Secondary | ICD-10-CM | POA: Diagnosis not present

## 2024-07-27 DIAGNOSIS — R42 Dizziness and giddiness: Secondary | ICD-10-CM | POA: Diagnosis not present

## 2024-07-27 LAB — CBC
HCT: 43.8 % (ref 39.0–52.0)
Hemoglobin: 14.5 g/dL (ref 13.0–17.0)
MCH: 29.8 pg (ref 26.0–34.0)
MCHC: 33.1 g/dL (ref 30.0–36.0)
MCV: 90.1 fL (ref 80.0–100.0)
Platelets: 166 K/uL (ref 150–400)
RBC: 4.86 MIL/uL (ref 4.22–5.81)
RDW: 12.3 % (ref 11.5–15.5)
WBC: 7.7 K/uL (ref 4.0–10.5)
nRBC: 0 % (ref 0.0–0.2)

## 2024-07-27 LAB — CBG MONITORING, ED
Glucose-Capillary: 570 mg/dL (ref 70–99)
Glucose-Capillary: 363 mg/dL — ABNORMAL HIGH (ref 70–99)

## 2024-07-27 LAB — BASIC METABOLIC PANEL WITH GFR
Anion gap: 11 (ref 5–15)
BUN: 15 mg/dL (ref 6–20)
CO2: 25 mmol/L (ref 22–32)
Calcium: 9 mg/dL (ref 8.9–10.3)
Chloride: 95 mmol/L — ABNORMAL LOW (ref 98–111)
Creatinine, Ser: 0.81 mg/dL (ref 0.61–1.24)
GFR, Estimated: >60 mL/min (ref >60)
Glucose, Bld: 571 mg/dL (ref 70–99)
Potassium: 4 mmol/L (ref 3.5–5.1)
Sodium: 131 mmol/L — ABNORMAL LOW (ref 135–145)

## 2024-07-27 LAB — TROPONIN I (HIGH SENSITIVITY): Troponin I (High Sensitivity): 4 ng/L (ref <18)

## 2024-07-27 MED ORDER — LACTATED RINGERS IV BOLUS
2000.0000 mL | Freq: Once | INTRAVENOUS | Status: AC
  Administered 2024-07-27: 2000 mL via INTRAVENOUS

## 2024-07-27 MED ORDER — INSULIN ASPART 100 UNIT/ML IJ SOLN
10.0000 [IU] | Freq: Once | INTRAMUSCULAR | Status: AC
  Administered 2024-07-27: 10 [IU] via SUBCUTANEOUS
  Filled 2024-07-27: qty 10

## 2024-07-27 NOTE — ED Provider Notes (Signed)
 [facility] Provider Note    Event Date/Time   First MD Initiated Contact with Patient 07/27/24 2255     (approximate)   History   Dizziness   HPI Ricardo Wood is a 43 y.o. male with diabetes on insulin  and metformin  who presents for evaluation of lightheadedness/dizziness, intermittent left-sided chest pain, nausea, some mild shortness of breath, and generally feeling not well.  All the symptoms been going on for about a day.  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  He has a prescription for it but he has not been able to pick it up.  He said his usual blood glucose is around 200 and sometimes goes up towards 300 but his fingerstick blood sugar was reading as high earlier tonight.  No recent fever.  No numbness or weakness in his extremities.     Physical Exam   Triage Vital Signs: ED Triage Vitals  Encounter Vitals Group     BP 07/27/24 2018 (!) 139/99     Girls Systolic BP Percentile --      Girls Diastolic BP Percentile --      Boys Systolic BP Percentile --      Boys Diastolic BP Percentile --      Pulse Rate 07/27/24 2018 94     Resp 07/27/24 2018 20     Temp 07/27/24 2018 98.9 F (37.2 C)     Temp Source 07/27/24 2018 Oral     SpO2 07/27/24 2018 95 %     Weight 07/27/24 2016 (!) 149.7 kg (330 lb)     Height 07/27/24 2016 1.829 m (6')     Head Circumference --      Peak Flow --      Pain Score 07/27/24 2016 5     Pain Loc --      Pain Education --      Exclude from Growth Chart --     Most recent vital signs: Vitals:   07/27/24 2212 07/27/24 2213  BP: (!) 133/97   Pulse: (!) 101   Resp: 20   Temp:  98.3 F (36.8 C)  SpO2: 95%     General: Awake, no distress.  Generally well-appearing, pleasant and conversant. CV:  Good peripheral perfusion.  Borderline tachycardia, regular rhythm. Resp:  Normal effort. Speaking easily and comfortably, no  accessory muscle usage nor intercostal retractions.   Abd:  No distention.  Other:  No gross focal neurological deficits appreciated   ED Results / Procedures / Treatments   Labs (all labs ordered are listed, but only abnormal results are displayed) Labs Reviewed  BASIC METABOLIC PANEL WITH GFR - Abnormal; Notable for the following components:      Result Value   Sodium 131 (*)    Chloride 95 (*)    Glucose, Bld 571 (*)    All other components within normal limits  CBG MONITORING, ED - Abnormal; Notable for the following components:   Glucose-Capillary 570 (*)    All other components within normal limits  CBG MONITORING, ED - Abnormal; Notable for the following components:   Glucose-Capillary 363 (*)    All other components within normal limits  CBC  TROPONIN I (HIGH SENSITIVITY)     EKG  ED ECG REPORT I, Darleene Dome, the attending physician, personally viewed and interpreted this ECG.  Date: 07/27/2024 EKG Time: 20: 17 Rate: 95 Rhythm: normal  sinus rhythm QRS Axis: normal Intervals: normal ST/T Wave abnormalities: normal Narrative Interpretation: no evidence of acute ischemia    RADIOLOGY I independently viewed and interpreted the patient's two-view chest x-ray and I see no evidence of pneumonia or pulmonary edema.  I also read the radiologist's report, which confirmed no acute findings.   PROCEDURES:  Critical Care performed: No  Procedures    IMPRESSION / MDM / ASSESSMENT AND PLAN / ED COURSE  I reviewed the triage vital signs and the nursing notes.                              Differential diagnosis includes, but is not limited to, uncontrolled diabetes, DKA, HHS  Patient's presentation is most consistent with acute presentation with potential threat to life or bodily function.  Labs/studies ordered: Two-view chest x-ray, EKG, high sensitive troponin, CBG, CBC, BMP  Interventions/Medications given:  Medications  lactated ringers  bolus 2,000 mL  (2,000 mLs Intravenous New Bag/Given 07/27/24 2327)  insulin  aspart (novoLOG ) injection 10 Units (10 Units Subcutaneous Given 07/27/24 2326)    (Note:  hospital course my include additional interventions and/or labs/studies not listed above.)   Vital signs generally reassuring, metabolic panel notable for glucose of 571 with normal anion gap and appropriate pseudohyponatremia.  No indication to repeat a high-sensitivity troponin given the vague symptoms and low risk for ACS.  Discussed with patient, plan is for 2 L of LR and 10 units of regular insulin  subcutaneous and then discharged for close outpatient follow-up.  Patient understands and agrees with the plan.  There is no indication for hospitalization at this time.   Clinical Course as of 07/28/24 0156  Fri Jul 28, 2024  0153 Patient feeling better, feels comfortable with the plan for discharge and outpatient follow-up.  I gave my usual and customary follow-up recommendations and return precautions [CF]    Clinical Course User Index [CF] Gordan Huxley, MD     FINAL CLINICAL IMPRESSION(S) / ED DIAGNOSES   Final diagnoses:  Hyperglycemia     Rx / DC Orders   ED Discharge Orders     None        Note:  This document was prepared using Dragon voice recognition software and may include unintentional dictation errors.   Gordan Huxley, MD 07/28/24 [phone]

## 2024-07-27 NOTE — ED Triage Notes (Addendum)
 Pt to triage via wheelchair.  Pt has dizzy spells since yesterday and pt has left side chest pain.  Pt reports nausea.  No v/d  pt has sob.  Pt alert  speech clear  hx diabetes no insulin  for 3 days.  Taking metformin .  Fsbs 570

## 2024-07-28 NOTE — Discharge Instructions (Signed)
 As we discussed, though your blood sugar is running high, it is not dangerous at this time.  It is important you take what ever medication you have and get your insulin  filled as soon as possible.  Please read through the included information about carbohydrate counting in the meantime so that she can try and keep your blood glucose level down is much as possible.  Please continue your medications and follow up with your regular doctor as recommended in these documents.  If you develop new or worsening symptoms that concern you, please return to the Emergency Department.

## 2024-07-31 ENCOUNTER — Ambulatory Visit (INDEPENDENT_AMBULATORY_CARE_PROVIDER_SITE_OTHER): Admitting: Nurse Practitioner

## 2024-07-31 ENCOUNTER — Encounter: Payer: Self-pay | Admitting: Nurse Practitioner

## 2024-07-31 ENCOUNTER — Other Ambulatory Visit: Payer: Self-pay

## 2024-07-31 VITALS — BP 107/70 | HR 87 | Temp 98.3°F | Ht 72.0 in | Wt 326.8 lb

## 2024-07-31 DIAGNOSIS — E119 Type 2 diabetes mellitus without complications: Secondary | ICD-10-CM | POA: Diagnosis not present

## 2024-07-31 DIAGNOSIS — Z7985 Long-term (current) use of injectable non-insulin antidiabetic drugs: Secondary | ICD-10-CM

## 2024-07-31 DIAGNOSIS — Z1159 Encounter for screening for other viral diseases: Secondary | ICD-10-CM | POA: Diagnosis not present

## 2024-07-31 MED ORDER — LANTUS SOLOSTAR 100 UNIT/ML ~~LOC~~ SOPN
60.0000 [IU] | PEN_INJECTOR | Freq: Every day | SUBCUTANEOUS | 1 refills | Status: DC
  Filled 2024-07-31 – 2024-08-23 (×2): qty 15, 25d supply, fill #0
  Filled 2024-09-19: qty 15, 25d supply, fill #1

## 2024-07-31 MED ORDER — OZEMPIC (0.25 OR 0.5 MG/DOSE) 2 MG/3ML ~~LOC~~ SOPN
0.2500 mg | PEN_INJECTOR | SUBCUTANEOUS | 1 refills | Status: AC
  Filled 2024-07-31 – 2024-08-23 (×4): qty 3, 42d supply, fill #0
  Filled 2024-10-04 – 2024-10-17 (×2): qty 3, 28d supply, fill #1

## 2024-07-31 NOTE — Progress Notes (Signed)
 BP 107/70   Pulse 87   Temp 98.3 F (36.8 C) (Oral)   Ht 6' (1.829 m)   Wt (!) 326 lb 12.8 oz (148.2 kg)   SpO2 98%   BMI 44.32 kg/m    Subjective:    Patient ID: Ricardo Wood, male    DOB: 06-24-1981, 43 y.o.   MRN: [id number]  HPI: Ricardo Wood is a 43 y.o. male  Chief Complaint  Patient presents with   COPD   Diabetes    Patient states he has been having high blood sugar readings recently. States he ran out of his medication due to lack of insurance. Patient states he has recently gotten insurance back and was started back on his meds. States he went to the ER 07/27/24 for hyperglycemia.    Skin Issue    Patient states he has noticed a red mark on his forehead up to his scalp that he states has gotten bigger.    DIABETES Patient's A1c came back last week as 12.4%.  He stopped taking the Metformin  in the morning.  Only able to tolerate it at night.  He ran out of Lantus  and was out of it for a 3 days.  Recently seen in the ER with hyperglycemia.   He is not able to pick up the Ozempic  due to cost.  Hypoglycemic episodes:no Polydipsia/polyuria: no Visual disturbance: no Chest pain: no Paresthesias: no Glucose Monitoring: no  Accucheck frequency: not really checking   Fasting glucose: never under 200  Post prandial:  Evening:  Before meals: Taking Insulin ?: Lantus  44u   Long acting insulin :   Short acting insulin : Blood Pressure Monitoring: not checking Retinal Examination: Not up to Date Foot Exam: Not up to Date Diabetic Education: Not Completed Pneumovax: Not up to Date Influenza: Not up to Date Aspirin : no      Relevant past medical, surgical, family and social history reviewed and updated as indicated. Interim medical history since our last visit reviewed. Allergies and medications reviewed and updated.  Review of Systems  Eyes:  Negative for visual disturbance.  Respiratory:  Negative for chest tightness and shortness of breath.    Cardiovascular:  Negative for chest pain, palpitations and leg swelling.  Gastrointestinal:  Positive for diarrhea. Negative for abdominal pain.  Endocrine: Negative for polydipsia and polyuria.  Skin:        Dry skin  Neurological:  Negative for dizziness, light-headedness, numbness and headaches.    Per HPI unless specifically indicated above     Objective:    BP 107/70   Pulse 87   Temp 98.3 F (36.8 C) (Oral)   Ht 6' (1.829 m)   Wt (!) 326 lb 12.8 oz (148.2 kg)   SpO2 98%   BMI 44.32 kg/m   Wt Readings from Last 3 Encounters:  07/31/24 (!) 326 lb 12.8 oz (148.2 kg)  07/27/24 (!) 330 lb (149.7 kg)  02/22/24 (!) 320 lb (145.2 kg)    Physical Exam Vitals and nursing note reviewed.  Constitutional:      General: He is not in acute distress.    Appearance: Normal appearance. He is not ill-appearing, toxic-appearing or diaphoretic.  HENT:     Head: Normocephalic.     Right Ear: External ear normal.     Left Ear: External ear normal.     Nose: Nose normal. No congestion or rhinorrhea.     Mouth/Throat:     Mouth: Mucous membranes are moist.  Eyes:  General:        Right eye: No discharge.        Left eye: No discharge.     Extraocular Movements: Extraocular movements intact.     Conjunctiva/sclera: Conjunctivae normal.     Pupils: Pupils are equal, round, and reactive to light.  Cardiovascular:     Rate and Rhythm: Normal rate and regular rhythm.     Heart sounds: No murmur heard. Pulmonary:     Effort: Pulmonary effort is normal. No respiratory distress.     Breath sounds: Normal breath sounds. No wheezing, rhonchi or rales.  Abdominal:     General: Abdomen is flat. Bowel sounds are normal. There is no distension.     Palpations: Abdomen is soft. There is no mass.     Tenderness: There is no abdominal tenderness. There is no right CVA tenderness, left CVA tenderness, guarding or rebound.     Hernia: No hernia is present.  Musculoskeletal:     Cervical  back: Normal range of motion and neck supple.  Skin:    General: Skin is warm and dry.     Capillary Refill: Capillary refill takes less than 2 seconds.  Neurological:     General: No focal deficit present.     Mental Status: He is alert and oriented to person, place, and time.  Psychiatric:        Mood and Affect: Mood normal.        Behavior: Behavior normal.        Thought Content: Thought content normal.        Judgment: Judgment normal.     Results for orders placed or performed during the hospital encounter of 07/27/24  Basic metabolic panel   Collection Time: 07/27/24  8:18 PM  Result Value Ref Range   Sodium 131 (L) 135 - 145 mmol/L   Potassium 4.0 3.5 - 5.1 mmol/L   Chloride 95 (L) 98 - 111 mmol/L   CO2 25 22 - 32 mmol/L   Glucose, Bld 571 (HH) 70 - 99 mg/dL   BUN 15 6 - 20 mg/dL   Creatinine, Ser 9.18 0.61 - 1.24 mg/dL   Calcium  9.0 8.9 - 10.3 mg/dL   GFR, Estimated >39 >39 mL/min   Anion gap 11 5 - 15  CBC   Collection Time: 07/27/24  8:18 PM  Result Value Ref Range   WBC 7.7 4.0 - 10.5 K/uL   RBC 4.86 4.22 - 5.81 MIL/uL   Hemoglobin 14.5 13.0 - 17.0 g/dL   HCT 56.1 60.9 - 47.9 %   MCV 90.1 80.0 - 100.0 fL   MCH 29.8 26.0 - 34.0 pg   MCHC 33.1 30.0 - 36.0 g/dL   RDW 87.6 88.4 - 84.4 %   Platelets 166 150 - 400 K/uL   nRBC 0.0 0.0 - 0.2 %  Troponin I (High Sensitivity)   Collection Time: 07/27/24  8:18 PM  Result Value Ref Range   Troponin I (High Sensitivity) 4 <18 ng/L  CBG monitoring, ED   Collection Time: 07/27/24  8:21 PM  Result Value Ref Range   Glucose-Capillary 570 (HH) 70 - 99 mg/dL   Comment 1 Notify RN    Comment 2 Document in Chart   POC CBG, ED   Collection Time: 07/27/24 11:20 PM  Result Value Ref Range   Glucose-Capillary 363 (H) 70 - 99 mg/dL      Assessment & Plan:   Problem List Items Addressed This Visit  Endocrine   Diabetes mellitus treated with injections of non-insulin  medication (HCC) - Primary   Chronic. Not  well controlled.  Last A1c was 12.4%.  Not taking Ozempic  due to cost.  Only taking Metformin  1000mg  at bedtime due to side effects during the day.  Will increase Lantus  to 60u nightly.  Labs ordered.  Follow up in 1 month.  Call sooner if concerns arise.      Relevant Medications   insulin  glargine (LANTUS  SOLOSTAR) 100 UNIT/ML Solostar Pen   Semaglutide ,0.25 or 0.5MG /DOS, (OZEMPIC , 0.25 OR 0.5 MG/DOSE,) 2 MG/3ML SOPN   Other Relevant Orders   Comprehensive metabolic panel with GFR   Hemoglobin A1c   Lipid panel   Other Visit Diagnoses       Encounter for hepatitis C screening test for low risk patient       Relevant Orders   Hepatitis C antibody         Follow up plan: Return in about 1 month (around 08/30/2024) for Medication Management.

## 2024-07-31 NOTE — Assessment & Plan Note (Addendum)
 Chronic. Not well controlled.  Last A1c was 12.4%.  Not taking Ozempic  due to cost.  Only taking Metformin  1000mg  at bedtime due to side effects during the day.  Will increase Lantus  to 60u nightly.  Labs ordered.  Follow up in 1 month.  Call sooner if concerns arise.

## 2024-08-01 ENCOUNTER — Ambulatory Visit: Payer: Self-pay | Admitting: Nurse Practitioner

## 2024-08-01 LAB — LIPID PANEL
Chol/HDL Ratio: 3.5 ratio (ref 0.0–5.0)
Cholesterol, Total: 154 mg/dL (ref 100–199)
HDL: 44 mg/dL (ref >39)
LDL Chol Calc (NIH): 84 mg/dL (ref 0–99)
Triglycerides: 151 mg/dL — ABNORMAL HIGH (ref 0–149)
VLDL Cholesterol Cal: 26 mg/dL (ref 5–40)

## 2024-08-01 LAB — COMPREHENSIVE METABOLIC PANEL WITH GFR
ALT: 38 IU/L (ref 0–44)
AST: 29 IU/L (ref 0–40)
Albumin: 4.3 g/dL (ref 4.1–5.1)
Alkaline Phosphatase: 106 IU/L (ref 47–123)
BUN/Creatinine Ratio: 16 (ref 9–20)
BUN: 12 mg/dL (ref 6–24)
Bilirubin Total: 0.4 mg/dL (ref 0.0–1.2)
CO2: 24 mmol/L (ref 20–29)
Calcium: 9.3 mg/dL (ref 8.7–10.2)
Chloride: 97 mmol/L (ref 96–106)
Creatinine, Ser: 0.73 mg/dL — ABNORMAL LOW (ref 0.76–1.27)
Globulin, Total: 2.7 g/dL (ref 1.5–4.5)
Glucose: 297 mg/dL — ABNORMAL HIGH (ref 70–99)
Potassium: 4.5 mmol/L (ref 3.5–5.2)
Sodium: 136 mmol/L (ref 134–144)
Total Protein: 7 g/dL (ref 6.0–8.5)
eGFR: 117 mL/min/1.73 (ref >59)

## 2024-08-01 LAB — HEPATITIS C ANTIBODY: Hep C Virus Ab: NONREACTIVE

## 2024-08-01 LAB — HEMOGLOBIN A1C
Est. average glucose Bld gHb Est-mCnc: 332 mg/dL
Hgb A1c MFr Bld: 13.2 % — ABNORMAL HIGH (ref 4.8–5.6)

## 2024-08-02 ENCOUNTER — Other Ambulatory Visit: Payer: Self-pay | Admitting: Nurse Practitioner

## 2024-08-02 ENCOUNTER — Other Ambulatory Visit: Payer: Self-pay

## 2024-08-03 ENCOUNTER — Other Ambulatory Visit: Payer: Self-pay

## 2024-08-03 ENCOUNTER — Other Ambulatory Visit: Payer: Self-pay | Admitting: Nurse Practitioner

## 2024-08-04 ENCOUNTER — Other Ambulatory Visit: Payer: Self-pay

## 2024-08-04 ENCOUNTER — Other Ambulatory Visit: Payer: Self-pay | Admitting: Nurse Practitioner

## 2024-08-04 MED FILL — Apixaban Tab 5 MG: ORAL | 90 days supply | Qty: 180 | Fill #0 | Status: CN

## 2024-08-04 NOTE — Progress Notes (Unsigned)
   08/07/24  Patient ID: Ricardo Wood, male   DOB: 1981/09/05, 43 y.o.   MRN: [id number]  Outreach attempt for scheduled telephone visit was not successful, but I was able to leave HIPAA compliant voicemail with my direct phone number.  I will try to call [name] again next week if I do not hear back.  [name], PharmD, DPLA

## 2024-08-04 NOTE — Telephone Encounter (Signed)
 Requested Prescriptions  Pending Prescriptions Disp Refills   apixaban  (ELIQUIS ) 5 MG TABS tablet [Pharmacy Med Name: apixaban  (ELIQUIS ) 5 MG Tab tablet] 180 tablet 0    Sig: Take 1 tablet (5 mg total) by mouth 2 (two) times daily.     Hematology:  Anticoagulants - apixaban  Failed - 08/04/2024  4:10 PM      Failed - Cr in normal range and within 360 days    Creatinine  Date Value Ref Range Status  10/19/2012 1.30 0.60 - 1.30 mg/dL Final   Creatinine, Ser  Date Value Ref Range Status  07/31/2024 0.73 (L) 0.76 - 1.27 mg/dL Final         Passed - PLT in normal range and within 360 days    Platelets  Date Value Ref Range Status  07/27/2024 166 150 - 400 K/uL Final  01/18/2024 206 150 - 450 x10E3/uL Final         Passed - HGB in normal range and within 360 days    Hemoglobin  Date Value Ref Range Status  07/27/2024 14.5 13.0 - 17.0 g/dL Final  95/77/7974 84.3 13.0 - 17.7 g/dL Final         Passed - HCT in normal range and within 360 days    HCT  Date Value Ref Range Status  07/27/2024 43.8 39.0 - 52.0 % Final   Hematocrit  Date Value Ref Range Status  01/18/2024 48.8 37.5 - 51.0 % Final         Passed - AST in normal range and within 360 days    AST  Date Value Ref Range Status  07/31/2024 29 0 - 40 IU/L Final   SGOT(AST)  Date Value Ref Range Status  10/19/2012 24 15 - 37 Unit/L Final         Passed - ALT in normal range and within 360 days    ALT  Date Value Ref Range Status  07/31/2024 38 0 - 44 IU/L Final   SGPT (ALT)  Date Value Ref Range Status  10/19/2012 54 12 - 78 U/L Final         Passed - Valid encounter within last 12 months    Recent Outpatient Visits           4 days ago Diabetes mellitus treated with injections of non-insulin  medication (HCC)   [place] [facility] Melvin Pao, NP   6 months ago Diabetes mellitus treated with injections of non-insulin  medication [facility])   [place] [facility] Melvin Pao, NP   6 months ago Hospital discharge follow-up   [place] Melvin Pao, NP   7 months ago Type 2 diabetes mellitus with other specified complication, without long-term current use of insulin  [facility])   [place] [facility] Melvin Pao, NP   7 months ago Chronic obstructive pulmonary disease, unspecified COPD type [facility])   [place] [facility] Melvin Pao, NP

## 2024-08-05 ENCOUNTER — Other Ambulatory Visit: Payer: Self-pay

## 2024-08-05 NOTE — Telephone Encounter (Signed)
 Duplicate request, refilled 08/04/24.  Requested Prescriptions  Pending Prescriptions Disp Refills   apixaban  (ELIQUIS ) 5 MG TABS tablet 60 tablet 0    Sig: Take 1 tablet (5 mg total) by mouth 2 (two) times daily.     Hematology:  Anticoagulants - apixaban  Failed - 08/05/2024  9:51 AM      Failed - Cr in normal range and within 360 days    Creatinine  Date Value Ref Range Status  10/19/2012 1.30 0.60 - 1.30 mg/dL Final   Creatinine, Ser  Date Value Ref Range Status  07/31/2024 0.73 (L) 0.76 - 1.27 mg/dL Final         Passed - PLT in normal range and within 360 days    Platelets  Date Value Ref Range Status  07/27/2024 166 150 - 400 K/uL Final  01/18/2024 206 150 - 450 x10E3/uL Final         Passed - HGB in normal range and within 360 days    Hemoglobin  Date Value Ref Range Status  07/27/2024 14.5 13.0 - 17.0 g/dL Final  95/77/7974 84.3 13.0 - 17.7 g/dL Final         Passed - HCT in normal range and within 360 days    HCT  Date Value Ref Range Status  07/27/2024 43.8 39.0 - 52.0 % Final   Hematocrit  Date Value Ref Range Status  01/18/2024 48.8 37.5 - 51.0 % Final         Passed - AST in normal range and within 360 days    AST  Date Value Ref Range Status  07/31/2024 29 0 - 40 IU/L Final   SGOT(AST)  Date Value Ref Range Status  10/19/2012 24 15 - 37 Unit/L Final         Passed - ALT in normal range and within 360 days    ALT  Date Value Ref Range Status  07/31/2024 38 0 - 44 IU/L Final   SGPT (ALT)  Date Value Ref Range Status  10/19/2012 54 12 - 78 U/L Final         Passed - Valid encounter within last 12 months    Recent Outpatient Visits           5 days ago Diabetes mellitus treated with injections of non-insulin  medication (HCC)   [place] [facility] Melvin Pao, NP   6 months ago Diabetes mellitus treated with injections of non-insulin  medication [facility])   [place] [facility] Melvin Pao, NP   6  months ago Hospital discharge follow-up   [place] [facility] Melvin Pao, NP   7 months ago Type 2 diabetes mellitus with other specified complication, without long-term current use of insulin  [facility])   [place] [facility] Melvin Pao, NP   7 months ago Chronic obstructive pulmonary disease, unspecified COPD type [facility])   [place] [facility] Melvin Pao, NP

## 2024-08-07 ENCOUNTER — Other Ambulatory Visit: Payer: Self-pay

## 2024-08-10 ENCOUNTER — Other Ambulatory Visit: Payer: Self-pay

## 2024-08-12 NOTE — Progress Notes (Signed)
   08/12/2024  Patient ID: Ricardo Wood, male   DOB: 01-07-1981, 43 y.o.   MRN: [id number]  Unsuccessful outreach attempt to follow-up with patient on management of chronic disease states.  HPAA compliant voicemail left with my direct number.  [name], PharmD, DPLA

## 2024-08-14 ENCOUNTER — Other Ambulatory Visit: Payer: Self-pay

## 2024-08-15 ENCOUNTER — Other Ambulatory Visit: Payer: Self-pay

## 2024-08-16 ENCOUNTER — Other Ambulatory Visit: Payer: Self-pay

## 2024-08-16 NOTE — Progress Notes (Unsigned)
   08/16/2024  Patient ID: Ricardo Wood, male   DOB: [birth year], 43 y.o.   MRN: [id number]  Patient outreach attempt to check in on medication access/adherence and control of diabetes.  I was not able to reach the patient but did leave a HIPAA compliant voicemail with my direct phone number.  I will try to call him again in a week if I do not hear back.  [name], PharmD, DPLA

## 2024-08-23 ENCOUNTER — Other Ambulatory Visit: Payer: Self-pay | Admitting: Nurse Practitioner

## 2024-08-23 ENCOUNTER — Other Ambulatory Visit: Payer: Self-pay

## 2024-08-23 ENCOUNTER — Telehealth: Payer: Self-pay

## 2024-08-23 DIAGNOSIS — E1169 Type 2 diabetes mellitus with other specified complication: Secondary | ICD-10-CM

## 2024-08-23 DIAGNOSIS — I825Y9 Chronic embolism and thrombosis of unspecified deep veins of unspecified proximal lower extremity: Secondary | ICD-10-CM

## 2024-08-23 MED ORDER — OLMESARTAN MEDOXOMIL 20 MG PO TABS
20.0000 mg | ORAL_TABLET | Freq: Every day | ORAL | 2 refills | Status: AC
  Filled 2024-08-23: qty 30, 30d supply, fill #0

## 2024-08-23 MED ORDER — ACCU-CHEK GUIDE TEST VI STRP
ORAL_STRIP | 12 refills | Status: AC
  Filled 2024-08-23: qty 100, 30d supply, fill #0
  Filled 2024-10-04: qty 100, 33d supply, fill #0

## 2024-08-23 MED ORDER — ACCU-CHEK SOFTCLIX LANCETS MISC
12 refills | Status: AC
  Filled 2024-08-23: qty 100, 25d supply, fill #0

## 2024-08-23 MED ORDER — ROSUVASTATIN CALCIUM 20 MG PO TABS
20.0000 mg | ORAL_TABLET | Freq: Every day | ORAL | 2 refills | Status: AC
  Filled 2024-08-23 – 2024-10-04 (×2): qty 30, 30d supply, fill #0

## 2024-08-23 MED FILL — Apixaban Tab 5 MG: ORAL | 90 days supply | Qty: 180 | Fill #0 | Status: CN

## 2024-08-23 MED FILL — Apixaban Tab 5 MG: ORAL | 30 days supply | Qty: 60 | Fill #0 | Status: CN

## 2024-08-23 MED FILL — Apixaban Tab 5 MG: ORAL | 90 days supply | Qty: 180 | Fill #0 | Status: AC

## 2024-08-23 NOTE — Progress Notes (Signed)
   08/23/2024  Patient ID: Ricardo Wood, male   DOB: December 30, 1980, 43 y.o.   MRN: [id number]  Outreach attempt to follow-up with patient in regard to medication refills and anticipated cost of those.  I was not able to reach him but did leave a HIPAA compliant voicemail with my direct phone number.  [name], PharmD, DPLA

## 2024-08-23 NOTE — Progress Notes (Signed)
   08/23/2024  Patient ID: [name], male   DOB: Jun 30, 1981, 43 y.o.   MRN: [id number]  Subjective/Objective Telephone visit to follow-up on medication management of chronic disease states.     Diabetes: Current medications: Lantus  60 units daily, metformin  XR 1000mg  at bedtime -Patient cannot tolerate metformin  1000mg  BID due to diarrhea, so he is only taking 1000mg  in the evening once home -Recently seen in the ED for hyperglycemia due to being out of medications -Did increase long-acting insulin  to 60 units, but has not been able to pick up Ozempic  (gets paid this week and plans to) -Endorses having enough Lantus  on hand at this time -Patient has not been monitoring home BG due to being out of testing supplies -Last UACR was 12/24/23, and this was elevated at 30-300  Lab Results  Component Value Date   HGBA1C 13.2 (H) 07/31/2024   HGBA1C 12.3 (H) 12/24/2023    Hyperlipidemia/ASCVD Risk Reduction Current lipid lowering medications: rosuvastatin  20mg  daily  -Patient is running low on rosuvastatin  and will need a refill     Component Value Date/Time   CHOL 154 07/31/2024 1429   TRIG 151 (H) 07/31/2024 1429   HDL 44 07/31/2024 1429   CHOLHDL 3.5 07/31/2024 1429   LDLCALC 84 07/31/2024 1429   LABVLDL 26 07/31/2024 1429    Chronic DVT: -Current medications:  Eliquis  5mg  BID -Running low on Eliquis  and will need a refill  Hypertension: -Current medications:  olmesartan  20mg  dailly -Also needs a refill on olmesartan  -Last PCP visit on 11/3 BP was 107/70   Assessment/Plan:    Diabetes: -Uncontrolled -Orders pending for PCP to sign to refill Accu Chek lancets and test strips -Resume monitoring home BG up to 3x/d -I will coordinate with [facility] pharmacy to refill Ozempic , so patient can pick up and resume with 0.25mg  weekly x4 weeks, then increasing to 0.5mg  weekly ASAP.  Patient currently has 3 active insurance plans, and it appears this will be cheapest if billed to  [employer] and [employer] (copay for 1 month of $13.89-[employer] will not cover more than 1 mo at time) -Test strips and lancets will be around $30 for both (30 day supplies if testing TID) -When patient is due for a refill, Lantus  should be $0 -Goal to increase Ozempic  every 4-8 weeks to 2mg  weekly dose to be able to decrease insulin  dosing  Hyperlipidemia/ASCVD Risk Reduction: -Improved LDL recently -Continue current regimen -Refill pending for rosuvastatin  20mg  for PCP to sign; this will be $4.42 on [employer] and [employer] plan for a 1 month supply   Chronic DVT: -Continue Eliquis  5mg  BID -Contacting [facility] to fill Eliquis -refills should be on file -Cheapest to fill this on patient's [employer] plan with copay card (should be able to get 90 day supply for $10)  Hypertension: -Controlled -Continue current regimen -Olmesartan  20mg  refill pending for PCP to sign; this will be $3.78 on [employer] and [employer] for 1 month supply   Follow Up Plan: I will verify copays with pharmacy once orders are sent, notify patient, and schedule a 4 week follow-up   [name], PharmD, DPLA

## 2024-08-23 NOTE — Progress Notes (Signed)
   08/23/2024  Patient ID: Ricardo Wood, male   DOB: [birth year], 43 y.o.   MRN: [id number]  Patient returned my call and he has already picked up the Ozempic , Eliquis , and Lantus  from [facility] today.  He currently has enough of his other medications to last until those orders are signed.  Patient sees PCP again 12/4, and I will check in with him again in another month.  [name], PharmD, DPLA

## 2024-08-25 ENCOUNTER — Other Ambulatory Visit: Payer: Self-pay | Admitting: Nurse Practitioner

## 2024-08-25 ENCOUNTER — Other Ambulatory Visit: Payer: Self-pay

## 2024-08-26 ENCOUNTER — Other Ambulatory Visit: Payer: Self-pay

## 2024-08-28 ENCOUNTER — Other Ambulatory Visit: Payer: Self-pay | Admitting: Nurse Practitioner

## 2024-08-28 ENCOUNTER — Other Ambulatory Visit: Payer: Self-pay

## 2024-08-28 ENCOUNTER — Encounter: Payer: Self-pay | Admitting: Nurse Practitioner

## 2024-08-28 ENCOUNTER — Other Ambulatory Visit (HOSPITAL_COMMUNITY): Payer: Self-pay

## 2024-08-28 ENCOUNTER — Ambulatory Visit (INDEPENDENT_AMBULATORY_CARE_PROVIDER_SITE_OTHER): Admitting: Nurse Practitioner

## 2024-08-28 VITALS — BP 126/78 | HR 98 | Temp 97.9°F | Resp 17 | Ht 72.01 in | Wt 334.8 lb

## 2024-08-28 DIAGNOSIS — M545 Low back pain, unspecified: Secondary | ICD-10-CM | POA: Diagnosis not present

## 2024-08-28 MED ORDER — HYDROCODONE-ACETAMINOPHEN 5-325 MG PO TABS
1.0000 | ORAL_TABLET | Freq: Four times a day (QID) | ORAL | 0 refills | Status: AC | PRN
  Filled 2024-08-28: qty 20, 5d supply, fill #0

## 2024-08-28 MED ORDER — CYCLOBENZAPRINE HCL 10 MG PO TABS
10.0000 mg | ORAL_TABLET | Freq: Three times a day (TID) | ORAL | 0 refills | Status: AC | PRN
  Filled 2024-08-28: qty 60, 20d supply, fill #0

## 2024-08-28 NOTE — Assessment & Plan Note (Signed)
 Acute for a few days after falling on leaves at work. Suspect more muscular in nature. Will send in Flexeril  to use as needed, instructed not to use if driving or working, and Norco short burst to take as needed since he is unable to take NSAIDs with his Eliquis  use. Recommend he avoid NSAIDs. Recommend he alternate heat and ice at home + alternate use of Voltaren gel and Icy/Hot Lidocaine  patches. Rest. Work note provided. No red flags on exam.

## 2024-08-28 NOTE — Progress Notes (Signed)
 BP 126/78 (BP Location: Left Arm, Patient Position: Sitting, Cuff Size: Large)   Pulse 98   Temp 97.9 F (36.6 C) (Oral)   Resp 17   Ht 6' 0.01 (1.829 m)   Wt (!) 334 lb 12.8 oz (151.9 kg)   SpO2 98%   BMI 45.40 kg/m    Subjective:    Patient ID: Ricardo Wood, male    DOB: 1980/11/03, 43 y.o.   MRN: [id number]  HPI: KONRAD HOAK is a 42 y.o. male  Chief Complaint  Patient presents with   Back Pain    Slid on leaves into a ditch on Friday. Worst sit to stand or bending over, very stiff.    BACK PAIN Hurt his back on Friday while at work. Was near ditch covered in leaves and fell down into the ditch, at time had no pain and went back to work.  Then Saturday had a hard time moving and getting out of bed. As soon as gets still it is hard to get back up. Recent A1c 13.2%. Duration: days Mechanism of injury: fell as above Location: Left and low back Onset: gradual Severity: 2/10 at present, but when getting out of bed is a 7-9/10 Quality: sharp, aching, pulling, and throbbing Frequency: constant when getting up, but when up and moving it eases off Radiation: none Aggravating factors: lifting and movement -- bending and twisting Alleviating factors: nothing Status: stable Treatments attempted: Tylenol  and rest  Relief with NSAIDs?: No NSAIDs Taken Nighttime pain:  no Paresthesias / decreased sensation:  no Bowel / bladder incontinence:  no Fevers:  no Dysuria / urinary frequency:  no   Relevant past medical, surgical, family and social history reviewed and updated as indicated. Interim medical history since our last visit reviewed. Allergies and medications reviewed and updated.  Review of Systems  Constitutional:  Negative for activity change, appetite change, diaphoresis, fatigue and fever.  Respiratory:  Negative for cough, chest tightness, shortness of breath and wheezing.   Cardiovascular:  Negative for chest pain, palpitations and leg swelling.   Gastrointestinal: Negative.   Musculoskeletal:  Positive for back pain.  Neurological: Negative.   Psychiatric/Behavioral: Negative.      Per HPI unless specifically indicated above     Objective:    BP 126/78 (BP Location: Left Arm, Patient Position: Sitting, Cuff Size: Large)   Pulse 98   Temp 97.9 F (36.6 C) (Oral)   Resp 17   Ht 6' 0.01 (1.829 m)   Wt (!) 334 lb 12.8 oz (151.9 kg)   SpO2 98%   BMI 45.40 kg/m   Wt Readings from Last 3 Encounters:  08/28/24 (!) 334 lb 12.8 oz (151.9 kg)  07/31/24 (!) 326 lb 12.8 oz (148.2 kg)  07/27/24 (!) 330 lb (149.7 kg)    Physical Exam Vitals and nursing note reviewed.  Constitutional:      General: He is awake. He is not in acute distress.    Appearance: He is well-developed and well-groomed. He is obese. He is not ill-appearing or toxic-appearing.  HENT:     Head: Normocephalic.     Right Ear: Hearing and external ear normal.     Left Ear: Hearing and external ear normal.  Eyes:     General: Lids are normal.     Extraocular Movements: Extraocular movements intact.     Conjunctiva/sclera: Conjunctivae normal.  Neck:     Thyroid: No thyromegaly.     Vascular: No carotid bruit.  Cardiovascular:  Rate and Rhythm: Normal rate and regular rhythm.     Heart sounds: Normal heart sounds. No murmur heard.    No gallop.  Pulmonary:     Effort: No accessory muscle usage or respiratory distress.     Breath sounds: Normal breath sounds.  Abdominal:     General: Bowel sounds are normal. There is no distension.     Palpations: Abdomen is soft.     Tenderness: There is no abdominal tenderness.  Musculoskeletal:     Cervical back: Full passive range of motion without pain.     Lumbar back: Spasms and tenderness present. No swelling or bony tenderness. Decreased range of motion. Negative right straight leg raise test and negative left straight leg raise test.     Right lower leg: No edema.     Left lower leg: No edema.      Comments: No rashes.  Lymphadenopathy:     Cervical: No cervical adenopathy.  Skin:    General: Skin is warm.     Capillary Refill: Capillary refill takes less than 2 seconds.  Neurological:     Mental Status: He is alert and oriented to person, place, and time.     Deep Tendon Reflexes: Reflexes are normal and symmetric.     Reflex Scores:      Brachioradialis reflexes are 2+ on the right side and 2+ on the left side.      Patellar reflexes are 2+ on the right side and 2+ on the left side. Psychiatric:        Attention and Perception: Attention normal.        Mood and Affect: Mood normal.        Speech: Speech normal.        Behavior: Behavior normal. Behavior is cooperative.        Thought Content: Thought content normal.   Back Exam:    Inspection:  Normal spinal curvature.  No deformity, ecchymosis, erythema, or lesions   Curvature: Normal   Deformity: no  Ecchymosis: none  Erythema:  none  Lesions: no    Palpation:     Midline spinal tenderness: none                Paralumbar tenderness: yes Left     Parathoracic tenderness: none     Buttocks tenderness: none     Range of Motion:      Flexion: Fingers to Knees     Extension:Decreased     Lateral bending:Decreased    Rotation:Decreased    Neuro Exam:Lower extremity DTRs normal & symmetric.  Strength and sensation intact.     Patellar DTRs: Normal    Special Tests:      Straight leg raise:negative  Results for orders placed or performed in visit on 07/31/24  Comprehensive metabolic panel with GFR   Collection Time: 07/31/24  2:29 PM  Result Value Ref Range   Glucose 297 (H) 70 - 99 mg/dL   BUN 12 6 - 24 mg/dL   Creatinine, Ser 9.26 (L) 0.76 - 1.27 mg/dL   eGFR 882 >40 fO/fpw/8.26   BUN/Creatinine Ratio 16 9 - 20   Sodium 136 134 - 144 mmol/L   Potassium 4.5 3.5 - 5.2 mmol/L   Chloride 97 96 - 106 mmol/L   CO2 24 20 - 29 mmol/L   Calcium  9.3 8.7 - 10.2 mg/dL   Total Protein 7.0 6.0 - 8.5 g/dL   Albumin 4.3  4.1 - 5.1 g/dL   Globulin,  Total 2.7 1.5 - 4.5 g/dL   Bilirubin Total 0.4 0.0 - 1.2 mg/dL   Alkaline Phosphatase 106 47 - 123 IU/L   AST 29 0 - 40 IU/L   ALT 38 0 - 44 IU/L  Hemoglobin A1c   Collection Time: 07/31/24  2:29 PM  Result Value Ref Range   Hgb A1c MFr Bld 13.2 (H) 4.8 - 5.6 %   Est. average glucose Bld gHb Est-mCnc 332 mg/dL  Lipid panel   Collection Time: 07/31/24  2:29 PM  Result Value Ref Range   Cholesterol, Total 154 100 - 199 mg/dL   Triglycerides 848 (H) 0 - 149 mg/dL   HDL 44 >60 mg/dL   VLDL Cholesterol Cal 26 5 - 40 mg/dL   LDL Chol Calc (NIH) 84 0 - 99 mg/dL   Chol/HDL Ratio 3.5 0.0 - 5.0 ratio  Hepatitis C antibody   Collection Time: 07/31/24  2:29 PM  Result Value Ref Range   Hep C Virus Ab Non Reactive Non Reactive      Assessment & Plan:   Problem List Items Addressed This Visit       Other   Acute low back pain - Primary   Acute for a few days after falling on leaves at work. Suspect more muscular in nature. Will send in Flexeril  to use as needed, instructed not to use if driving or working, and Norco short burst to take as needed since he is unable to take NSAIDs with his Eliquis  use. Recommend he avoid NSAIDs. Recommend he alternate heat and ice at home + alternate use of Voltaren gel and Icy/Hot Lidocaine  patches. Rest. Work note provided. No red flags on exam.      Relevant Medications   HYDROcodone -acetaminophen  (NORCO/VICODIN) 5-325 MG tablet   cyclobenzaprine  (FLEXERIL ) 10 MG tablet     Follow up plan: Return if symptoms worsen or fail to improve.

## 2024-08-28 NOTE — Patient Instructions (Signed)
 Acute Back Pain, Adult Acute back pain is sudden and usually short-lived. It is often caused by an injury to the muscles and tissues in the back. The injury may result from: A muscle, tendon, or ligament getting overstretched or torn. Ligaments are tissues that connect bones to each other. Lifting something improperly can cause a back strain. Wear and tear (degeneration) of the spinal disks. Spinal disks are circular tissue that provide cushioning between the bones of the spine (vertebrae). Twisting motions, such as while playing sports or doing yard work. A hit to the back. Arthritis. You may have a physical exam, lab tests, and imaging tests to find the cause of your pain. Acute back pain usually goes away with rest and home care. Follow these instructions at home: Managing pain, stiffness, and swelling Take over-the-counter and prescription medicines only as told by your health care provider. Treatment may include medicines for pain and inflammation that are taken by mouth or applied to the skin, or muscle relaxants. Your health care provider may recommend applying ice during the first 24-48 hours after your pain starts. To do this: Put ice in a plastic bag. Place a towel between your skin and the bag. Leave the ice on for 20 minutes, 2-3 times a day. Remove the ice if your skin turns bright red. This is very important. If you cannot feel pain, heat, or cold, you have a greater risk of damage to the area. If directed, apply heat to the affected area as often as told by your health care provider. Use the heat source that your health care provider recommends, such as a moist heat pack or a heating pad. Place a towel between your skin and the heat source. Leave the heat on for 20-30 minutes. Remove the heat if your skin turns bright red. This is especially important if you are unable to feel pain, heat, or cold. You have a greater risk of getting burned. Activity  Do not stay in bed. Staying in  bed for more than 1-2 days can delay your recovery. Sit up and stand up straight. Avoid leaning forward when you sit or hunching over when you stand. If you work at a desk, sit close to it so you do not need to lean over. Keep your chin tucked in. Keep your neck drawn back, and keep your elbows bent at a 90-degree angle (right angle). Sit high and close to the steering wheel when you drive. Add lower back (lumbar) support to your car seat, if needed. Take short walks on even surfaces as soon as you are able. Try to increase the length of time you walk each day. Do not sit, drive, or stand in one place for more than 30 minutes at a time. Sitting or standing for long periods of time can put stress on your back. Do not drive or use heavy machinery while taking prescription pain medicine. Use proper lifting techniques. When you bend and lift, use positions that put less stress on your back: [place] your knees. Keep the load close to your body. Avoid twisting. Exercise regularly as told by your health care provider. Exercising helps your back heal faster and helps prevent back injuries by keeping muscles strong and flexible. Work with a physical therapist to make a safe exercise program, as recommended by your health care provider. Do any exercises as told by your physical therapist. Lifestyle Maintain a healthy weight. Extra weight puts stress on your back and makes it difficult to have good  posture. Avoid activities or situations that make you feel anxious or stressed. Stress and anxiety increase muscle tension and can make back pain worse. Learn ways to manage anxiety and stress, such as through exercise. General instructions Sleep on a firm mattress in a comfortable position. Try lying on your side with your knees slightly bent. If you lie on your back, put a pillow under your knees. Keep your head and neck in a straight line with your spine (neutral position) when using electronic equipment like  smartphones or pads. To do this: Raise your smartphone or pad to look at it instead of bending your head or neck to look down. Put the smartphone or pad at the level of your face while looking at the screen. Follow your treatment plan as told by your health care provider. This may include: Cognitive or behavioral therapy. Acupuncture or massage therapy. Meditation or yoga. Contact a health care provider if: You have pain that is not relieved with rest or medicine. You have increasing pain going down into your legs or buttocks. Your pain does not improve after 2 weeks. You have pain at night. You lose weight without trying. You have a fever or chills. You develop nausea or vomiting. You develop abdominal pain. Get help right away if: You develop new bowel or bladder control problems. You have unusual weakness or numbness in your arms or legs. You feel faint. These symptoms may represent a serious problem that is an emergency. Do not wait to see if the symptoms will go away. Get medical help right away. Call your local emergency services (911 in the [place] Do not drive yourself to the hospital. Summary Acute back pain is sudden and usually short-lived. Use proper lifting techniques. When you bend and lift, use positions that put less stress on your back. Take over-the-counter and prescription medicines only as told by your health care provider, and apply heat or ice as told. This information is not intended to replace advice given to you by your health care provider. Make sure you discuss any questions you have with your health care provider. Document Revised: 12/06/2020 Document Reviewed: 12/06/2020 Elsevier Patient Education  2024 [employer].

## 2024-08-29 ENCOUNTER — Other Ambulatory Visit: Payer: Self-pay

## 2024-08-31 ENCOUNTER — Telehealth: Payer: Self-pay | Admitting: *Deleted

## 2024-08-31 ENCOUNTER — Other Ambulatory Visit: Payer: Self-pay

## 2024-08-31 ENCOUNTER — Ambulatory Visit: Admitting: Nurse Practitioner

## 2024-08-31 ENCOUNTER — Encounter: Payer: Self-pay | Admitting: Nurse Practitioner

## 2024-08-31 VITALS — BP 108/72 | HR 92 | Temp 98.7°F | Ht 72.0 in | Wt 334.8 lb

## 2024-08-31 DIAGNOSIS — Z7985 Long-term (current) use of injectable non-insulin antidiabetic drugs: Secondary | ICD-10-CM | POA: Diagnosis not present

## 2024-08-31 DIAGNOSIS — E119 Type 2 diabetes mellitus without complications: Secondary | ICD-10-CM | POA: Diagnosis not present

## 2024-08-31 MED FILL — Metformin HCl Tab ER 24HR 500 MG: ORAL | 30 days supply | Qty: 60 | Fill #0 | Status: CN

## 2024-08-31 NOTE — Assessment & Plan Note (Signed)
 Chronic. Not well controlled.  Last A1c was 12.4%.  Started Ozempic  last week.  Sugars have improved from 400s to 200s.  Tolerating Metformin  1000mg  daily and Lantus  60u. Increase to 0.5mg  weekly of Ozempic  after the first 4 weeks.  Follow up in office in 2 months.  Call sooner if concerns arise.

## 2024-08-31 NOTE — Telephone Encounter (Signed)
 Requested Prescriptions  Pending Prescriptions Disp Refills   metFORMIN  (GLUCOPHAGE -XR) 500 MG 24 hr tablet 180 tablet 0    Sig: Take 2 tablets (1,000 mg total) by mouth at bedtime.     Endocrinology:  Diabetes - Biguanides Failed - 08/31/2024  1:40 PM      Failed - Cr in normal range and within 360 days    Creatinine  Date Value Ref Range Status  10/19/2012 1.30 0.60 - 1.30 mg/dL Final   Creatinine, Ser  Date Value Ref Range Status  07/31/2024 0.73 (L) 0.76 - 1.27 mg/dL Final         Failed - HBA1C is between 0 and 7.9 and within 180 days    Hgb A1c MFr Bld  Date Value Ref Range Status  07/31/2024 13.2 (H) 4.8 - 5.6 % Final    Comment:             Prediabetes: 5.7 - 6.4          Diabetes: >6.4          Glycemic control for adults with diabetes: <7.0          Failed - B12 Level in normal range and within 720 days    No results found for: VITAMINB12       Passed - eGFR in normal range and within 360 days    EGFR (African American)  Date Value Ref Range Status  10/19/2012 >60  Final   GFR calc Af Amer  Date Value Ref Range Status  06/17/2020 >60 >60 mL/min Final   EGFR (Non-African Amer.)  Date Value Ref Range Status  10/19/2012 >60  Final    Comment:    eGFR values <13mL/min/1.73 m2 may be an indication of chronic kidney disease (CKD). Calculated eGFR is useful in patients with stable renal function. The eGFR calculation will not be reliable in acutely ill patients when serum creatinine is changing rapidly. It is not useful in  patients on dialysis. The eGFR calculation may not be applicable to patients at the low and high extremes of body sizes, pregnant women, and vegetarians.    GFR, Estimated  Date Value Ref Range Status  07/27/2024 >60 >60 mL/min Final    Comment:    (NOTE) Calculated using the CKD-EPI Creatinine Equation (2021)    eGFR  Date Value Ref Range Status  07/31/2024 117 >59 mL/min/1.73 Final         Passed - Valid encounter within  last 6 months    Recent Outpatient Visits           3 days ago Acute left-sided low back pain without sciatica   [facility] Talala, Melanie T, NP   1 month ago Diabetes mellitus treated with injections of non-insulin  medication [facility])   [facility] Melvin Pao, NP   7 months ago Diabetes mellitus treated with injections of non-insulin  medication [facility])   [facility] Melvin Pao, NP   7 months ago Hospital discharge follow-up   [facility] Melvin Pao, NP   8 months ago Type 2 diabetes mellitus with other specified complication, without long-term current use of insulin  [facility])   [facility] Melvin Pao, NP              Passed - CBC within normal limits and completed in the last 12 months    WBC  Date Value Ref Range Status  07/27/2024 7.7 4.0 -  10.5 K/uL Final   RBC  Date Value Ref Range Status  07/27/2024 4.86 4.22 - 5.81 MIL/uL Final   Hemoglobin  Date Value Ref Range Status  07/27/2024 14.5 13.0 - 17.0 g/dL Final  95/77/7974 84.3 13.0 - 17.7 g/dL Final   HCT  Date Value Ref Range Status  07/27/2024 43.8 39.0 - 52.0 % Final   Hematocrit  Date Value Ref Range Status  01/18/2024 48.8 37.5 - 51.0 % Final   MCHC  Date Value Ref Range Status  07/27/2024 33.1 30.0 - 36.0 g/dL Final   [facility]  Date Value Ref Range Status  07/27/2024 29.8 26.0 - 34.0 pg Final   MCV  Date Value Ref Range Status  07/27/2024 90.1 80.0 - 100.0 fL Final  01/18/2024 92 79 - 97 fL Final  10/19/2012 92 80 - 100 fL Final   No results found for: PLTCOUNTKUC, LABPLAT, POCPLA RDW  Date Value Ref Range Status  07/27/2024 12.3 11.5 - 15.5 % Final  01/18/2024 11.9 11.6 - 15.4 % Final  10/19/2012 13.8 11.5 - 14.5 % Final

## 2024-08-31 NOTE — Progress Notes (Signed)
 Complex Care Management Note Care Guide Note  08/31/2024 Name: Ricardo Wood MRN: [id number] DOB: 07-11-1981   Complex Care Management Outreach Attempts: An unsuccessful telephone outreach was attempted today to offer the patient information about available complex care management services.  Follow Up Plan:  Additional outreach attempts will be made to offer the patient complex care management information and services.   Encounter Outcome:  No Answer  [facility] HealthPopulation Health Care Guide  Direct [phone] Fax:[phone] Website: [email or web address]

## 2024-08-31 NOTE — Progress Notes (Signed)
 BP 108/72   Pulse 92   Temp 98.7 F (37.1 C) (Oral)   Ht 6' (1.829 m)   Wt (!) 334 lb 12.8 oz (151.9 kg)   SpO2 96%   BMI 45.41 kg/m    Subjective:    Patient ID: Ricardo Wood, male    DOB: May 01, 1981, 43 y.o.   MRN: [id number]  HPI: Ricardo Wood is a 43 y.o. male  Chief Complaint  Patient presents with   Diabetes   DIABETES Patient's A1c came back last week as 12.4%. Sugars are running in the 200s.  He took his first dose last Friday.  He is taking Lantus  60u, Metformin  1000mg  at night.   He is not able to pick up the Ozempic  due to cost.  Hypoglycemic episodes:no Polydipsia/polyuria: no Visual disturbance: no Chest pain: no Paresthesias: no Glucose Monitoring: no  Accucheck frequency: not really checking   Fasting glucose: never under 200  Post prandial:  Evening:  Before meals: Taking Insulin ?: Lantus  44u   Long acting insulin :   Short acting insulin : Blood Pressure Monitoring: not checking Retinal Examination: Not up to Date Foot Exam: Not up to Date Diabetic Education: Not Completed Pneumovax: Not up to Date Influenza: Not up to Date Aspirin : no      Relevant past medical, surgical, family and social history reviewed and updated as indicated. Interim medical history since our last visit reviewed. Allergies and medications reviewed and updated.  Review of Systems  Eyes:  Negative for visual disturbance.  Respiratory:  Negative for chest tightness and shortness of breath.   Cardiovascular:  Negative for chest pain, palpitations and leg swelling.  Gastrointestinal:  Negative for abdominal pain.  Endocrine: Negative for polydipsia and polyuria.  Skin:        Dry skin  Neurological:  Negative for dizziness, light-headedness, numbness and headaches.    Per HPI unless specifically indicated above     Objective:    BP 108/72   Pulse 92   Temp 98.7 F (37.1 C) (Oral)   Ht 6' (1.829 m)   Wt (!) 334 lb 12.8 oz (151.9 kg)   SpO2 96%    BMI 45.41 kg/m   Wt Readings from Last 3 Encounters:  08/31/24 (!) 334 lb 12.8 oz (151.9 kg)  08/28/24 (!) 334 lb 12.8 oz (151.9 kg)  07/31/24 (!) 326 lb 12.8 oz (148.2 kg)    Physical Exam Vitals and nursing note reviewed.  Constitutional:      General: He is not in acute distress.    Appearance: Normal appearance. He is not ill-appearing, toxic-appearing or diaphoretic.  HENT:     Head: Normocephalic.     Right Ear: External ear normal.     Left Ear: External ear normal.     Nose: Nose normal. No congestion or rhinorrhea.     Mouth/Throat:     Mouth: Mucous membranes are moist.  Eyes:     General:        Right eye: No discharge.        Left eye: No discharge.     Extraocular Movements: Extraocular movements intact.     Conjunctiva/sclera: Conjunctivae normal.     Pupils: Pupils are equal, round, and reactive to light.  Cardiovascular:     Rate and Rhythm: Normal rate and regular rhythm.     Heart sounds: No murmur heard. Pulmonary:     Effort: Pulmonary effort is normal. No respiratory distress.     Breath sounds: Normal breath  sounds. No wheezing, rhonchi or rales.  Abdominal:     General: Abdomen is flat. Bowel sounds are normal. There is no distension.     Palpations: Abdomen is soft. There is no mass.     Tenderness: There is no abdominal tenderness. There is no right CVA tenderness, left CVA tenderness, guarding or rebound.     Hernia: No hernia is present.  Musculoskeletal:     Cervical back: Normal range of motion and neck supple.  Skin:    General: Skin is warm and dry.     Capillary Refill: Capillary refill takes less than 2 seconds.  Neurological:     General: No focal deficit present.     Mental Status: He is alert and oriented to person, place, and time.  Psychiatric:        Mood and Affect: Mood normal.        Behavior: Behavior normal.        Thought Content: Thought content normal.        Judgment: Judgment normal.     Results for orders placed or  performed in visit on 07/31/24  Comprehensive metabolic panel with GFR   Collection Time: 07/31/24  2:29 PM  Result Value Ref Range   Glucose 297 (H) 70 - 99 mg/dL   BUN 12 6 - 24 mg/dL   Creatinine, Ser 9.26 (L) 0.76 - 1.27 mg/dL   eGFR 882 >40 fO/fpw/8.26   BUN/Creatinine Ratio 16 9 - 20   Sodium 136 134 - 144 mmol/L   Potassium 4.5 3.5 - 5.2 mmol/L   Chloride 97 96 - 106 mmol/L   CO2 24 20 - 29 mmol/L   Calcium  9.3 8.7 - 10.2 mg/dL   Total Protein 7.0 6.0 - 8.5 g/dL   Albumin 4.3 4.1 - 5.1 g/dL   Globulin, Total 2.7 1.5 - 4.5 g/dL   Bilirubin Total 0.4 0.0 - 1.2 mg/dL   Alkaline Phosphatase 106 47 - 123 IU/L   AST 29 0 - 40 IU/L   ALT 38 0 - 44 IU/L  Hemoglobin A1c   Collection Time: 07/31/24  2:29 PM  Result Value Ref Range   Hgb A1c MFr Bld 13.2 (H) 4.8 - 5.6 %   Est. average glucose Bld gHb Est-mCnc 332 mg/dL  Lipid panel   Collection Time: 07/31/24  2:29 PM  Result Value Ref Range   Cholesterol, Total 154 100 - 199 mg/dL   Triglycerides 848 (H) 0 - 149 mg/dL   HDL 44 >60 mg/dL   VLDL Cholesterol Cal 26 5 - 40 mg/dL   LDL Chol Calc (NIH) 84 0 - 99 mg/dL   Chol/HDL Ratio 3.5 0.0 - 5.0 ratio  Hepatitis C antibody   Collection Time: 07/31/24  2:29 PM  Result Value Ref Range   Hep C Virus Ab Non Reactive Non Reactive      Assessment & Plan:   Problem List Items Addressed This Visit       Endocrine   Diabetes mellitus treated with injections of non-insulin  medication (HCC) - Primary   Chronic. Not well controlled.  Last A1c was 12.4%.  Started Ozempic  last week.  Sugars have improved from 400s to 200s.  Tolerating Metformin  1000mg  daily and Lantus  60u. Increase to 0.5mg  weekly of Ozempic  after the first 4 weeks.  Follow up in office in 2 months.  Call sooner if concerns arise.           Follow up plan: Return in about 2 months (  around 11/01/2024) for HTN, HLD, DM2 FU.

## 2024-09-05 ENCOUNTER — Telehealth: Payer: Self-pay | Admitting: *Deleted

## 2024-09-05 NOTE — Progress Notes (Signed)
 Complex Care Management Note Care Guide Note  09/05/2024 Name: Ricardo Wood MRN: [id number] DOB: 1981-06-16   Complex Care Management Outreach Attempts: A second unsuccessful outreach was attempted today to offer the patient with information about available complex care management services.  Follow Up Plan:  Additional outreach attempts will be made to offer the patient complex care management information and services.   Encounter Outcome:  No Answer [facility] HealthPopulation Health Care Guide  Direct Dial:[phone] [phone] Website: [email or web address]

## 2024-09-07 ENCOUNTER — Telehealth: Payer: Self-pay | Admitting: *Deleted

## 2024-09-07 NOTE — Progress Notes (Signed)
 Complex Care Management Note Care Guide Note  09/07/2024 Name: Ricardo Wood MRN: [id number] DOB: 12-Dec-1980   Complex Care Management Outreach Attempts: A third unsuccessful outreach was attempted today to offer the patient with information about available complex care management services.  Follow Up Plan:  No further outreach attempts will be made at this time. We have been unable to contact the patient to offer or enroll patient in complex care management services.  Encounter Outcome:  No Answer  [facility] Care Guide  Direct Dial:[phone] Fax:[phone] Website: [email or web address]

## 2024-09-08 ENCOUNTER — Ambulatory Visit: Payer: Self-pay

## 2024-09-08 NOTE — Telephone Encounter (Signed)
 FYI Only or Action Required?: FYI only for provider: ED advised.  Patient was last seen in primary care on 08/31/2024 by Melvin Pao, NP.  Called Nurse Triage reporting Abdominal Pain, Diarrhea, Dizziness, Dental Pain, Shortness of Breath, and Chest Pain.  Symptoms began several days ago.  Interventions attempted: Nothing.  Symptoms are: gradually worsening.  Triage Disposition: Go to ED Now (overriding Call EMS 911 Now)  Patient/caregiver understands and will follow disposition?: Yes             Copied from CRM #[id number]. Topic: Clinical - Red Word Triage >> Sep 08, 2024  8:24 AM Myrick T wrote: Red Word that prompted transfer to Nurse Triage: infection in mouth from infection, he feels really bad and sob. He believes he has septic, he has pain in his mouth, dizzy and has diarrhea. Reason for Disposition  [1] Chest pain lasts > 5 minutes AND [2] age > 30 AND [3] one or more cardiac risk factors (e.g., diabetes, high blood pressure, high cholesterol, obesity with BMI 30 or higher, smoker, or strong family history of heart disease)  Answer Assessment - Initial Assessment Questions 1. LOCATION: Where does it hurt?       Did not assess.  2. RADIATION: Does the pain go anywhere else? (e.g., into neck, jaw, arms, back)     Did not assess. 3. ONSET: When did the chest pain begin? (Minutes, hours or days)      Started last night.  4. PATTERN: Does the pain come and go, or has it been constant since it started?  Does it get worse with exertion?      Comes and goes, present now.  5. DURATION: How long does it last (e.g., seconds, minutes, hours)     30-40 minutes.  6. SEVERITY: How bad is the pain?  (e.g., Scale 1-10; mild, moderate, or severe)     5/10.  7. CARDIAC RISK FACTORS: Do you have any history of heart problems or risk factors for heart disease? (e.g., angina, prior heart attack; diabetes, high blood pressure, high cholesterol, smoker, or  strong family history of heart disease)     Diabetes, DVT.  8. PULMONARY RISK FACTORS: Do you have any history of lung disease?  (e.g., blood clots in lung, asthma, emphysema, birth control pills)     No.  9. CAUSE: What do you think is causing the chest pain?     Unsure.  10. OTHER SYMPTOMS: Do you have any other symptoms? (e.g., dizziness, nausea, vomiting, sweating, fever, difficulty breathing, cough)       Several broken teeth with concern for infection with gum swelling, mild SOB, diarrhea with abdominal pain and foul odor belching, dizzy/lightheaded.  Unsure about fever. Denies any difficulty swallowing.  Protocols used: Chest Pain-A-AH

## 2024-09-10 ENCOUNTER — Other Ambulatory Visit: Payer: Self-pay

## 2024-09-19 ENCOUNTER — Other Ambulatory Visit: Payer: Self-pay

## 2024-09-20 NOTE — Progress Notes (Unsigned)
" ° °  09/25/2024  Patient ID: [name], male   DOB: [birth year], 43 y.o.   MRN: [id number]  Unsuccessful outreach attempt to follow-up on management of diabetes and medication access/affordability/adherence.  I was able to leave a HIPAA compliant voicemail with my direct number and will try to call the patient again next week if I do not hear back.  [name], PharmD, DPLA  "

## 2024-09-25 ENCOUNTER — Other Ambulatory Visit: Payer: Self-pay

## 2024-10-02 NOTE — Progress Notes (Signed)
" ° °  10/03/24  Patient ID: Ricardo Wood, male   DOB: 06-Nov-1980, 44 y.o.   MRN: [id number]  Subjective/Objective Telephone visit to follow-up on medication management of chronic disease states.     Diabetes: Current medications: Lantus  60 units daily, metformin  XR 1000mg  at bedtime, Ozempic  0.5mg  weekly -Patient cannot tolerate metformin  1000mg  BID due to diarrhea, so he is only taking 1000mg  in the evening once home -Patient was seen in the ED somewhat recently for hyperglycemia due to being out of medications -He missed his Ozempic  dose due last Friday and needs this medication and Accu Check Guide test strips refilled at this time -Endorses having enough Lantus  on hand at this time -Tolerating Ozempic  0.5mg  weekly mostly well, but he does have foul smelling and tasting sulfur-like burps -Current experiencing stomach bug with diarrhea for the last 2 days -Patient states FBG is averaging 200-300 currently -Last UACR was 12/24/23, and this was elevated at 30-300 -ACEi/ARB for cardiorenal protection:  olmesartan  20mg  daily -Statin for ASCVD risk reduction:  rosuvastatin  20mg   Lab Results  Component Value Date   HGBA1C 13.2 (H) 07/31/2024   HGBA1C 12.3 (H) 12/24/2023    Hyperlipidemia/ASCVD Risk Reduction Current lipid lowering medications: rosuvastatin  20mg  daily  -Patient is running low on rosuvastatin  and will need a refill     Component Value Date/Time   CHOL 154 07/31/2024 1429   TRIG 151 (H) 07/31/2024 1429   HDL 44 07/31/2024 1429   CHOLHDL 3.5 07/31/2024 1429   LDLCALC 84 07/31/2024 1429   LABVLDL 26 07/31/2024 1429   Assessment/Plan:    Diabetes: -Uncontrolled with A1c goal <7% -UACR is not at goal; consider addition of SGLT2 once A1c better controlled -BP is at goal of <130/80 -LDL is not at goal of <70 -Continue current regimen at this time, but I do recommend initiating glipizide xl 5mg  daily once patient has recovered from stomach bug -Advised him to stay  hydrated and be seen by provider if diarrhea does not resolve in the next 48 hours or if he begins running a fever or develops additional symptoms.  Told him he can use OTC immodium if needed. -Contacting pharmacy to refill Ozempic  and test strips -Goal to increase Ozempic  once sulfur burps decrease; advised patient he can use OTC Tums or famotidine .   -PCP again in March- patient will be due for A1c, CMP, and lipids  Hyperlipidemia/ASCVD Risk Reduction: -Improved LDL recently -Continue current regimen at this time- contacting pharmacy to refill rosuvastatn -Due for lipid panel at next PCP visit; if LDL still >70, consider increasing rosuvastatin  to 40mg  daily  Follow Up Plan: 2 weeks  Ricardo Wood, PharmD, DPLA  "

## 2024-10-03 ENCOUNTER — Other Ambulatory Visit (INDEPENDENT_AMBULATORY_CARE_PROVIDER_SITE_OTHER): Payer: Self-pay

## 2024-10-03 DIAGNOSIS — Z7985 Long-term (current) use of injectable non-insulin antidiabetic drugs: Secondary | ICD-10-CM

## 2024-10-03 DIAGNOSIS — E119 Type 2 diabetes mellitus without complications: Secondary | ICD-10-CM

## 2024-10-04 ENCOUNTER — Other Ambulatory Visit: Payer: Self-pay

## 2024-10-11 ENCOUNTER — Other Ambulatory Visit: Payer: Self-pay

## 2024-10-11 NOTE — Progress Notes (Unsigned)
" ° °  10/03/24  Patient ID: Ricardo Wood, male   DOB: 06-Nov-1980, 44 y.o.   MRN: [id number]  Subjective/Objective Telephone visit to follow-up on medication management of chronic disease states.     Diabetes: Current medications: Lantus  60 units daily, metformin  XR 1000mg  at bedtime, Ozempic  0.5mg  weekly -Patient cannot tolerate metformin  1000mg  BID due to diarrhea, so he is only taking 1000mg  in the evening once home -Patient was seen in the ED somewhat recently for hyperglycemia due to being out of medications -He missed his Ozempic  dose due last Friday and needs this medication and Accu Check Guide test strips refilled at this time -Endorses having enough Lantus  on hand at this time -Tolerating Ozempic  0.5mg  weekly mostly well, but he does have foul smelling and tasting sulfur-like burps -Current experiencing stomach bug with diarrhea for the last 2 days -Patient states FBG is averaging 200-300 currently -Last UACR was 12/24/23, and this was elevated at 30-300 -ACEi/ARB for cardiorenal protection:  olmesartan  20mg  daily -Statin for ASCVD risk reduction:  rosuvastatin  20mg   Lab Results  Component Value Date   HGBA1C 13.2 (H) 07/31/2024   HGBA1C 12.3 (H) 12/24/2023    Hyperlipidemia/ASCVD Risk Reduction Current lipid lowering medications: rosuvastatin  20mg  daily  -Patient is running low on rosuvastatin  and will need a refill     Component Value Date/Time   CHOL 154 07/31/2024 1429   TRIG 151 (H) 07/31/2024 1429   HDL 44 07/31/2024 1429   CHOLHDL 3.5 07/31/2024 1429   LDLCALC 84 07/31/2024 1429   LABVLDL 26 07/31/2024 1429   Assessment/Plan:    Diabetes: -Uncontrolled with A1c goal <7% -UACR is not at goal; consider addition of SGLT2 once A1c better controlled -BP is at goal of <130/80 -LDL is not at goal of <70 -Continue current regimen at this time, but I do recommend initiating glipizide xl 5mg  daily once patient has recovered from stomach bug -Advised him to stay  hydrated and be seen by provider if diarrhea does not resolve in the next 48 hours or if he begins running a fever or develops additional symptoms.  Told him he can use OTC immodium if needed. -Contacting pharmacy to refill Ozempic  and test strips -Goal to increase Ozempic  once sulfur burps decrease; advised patient he can use OTC Tums or famotidine .   -PCP again in March- patient will be due for A1c, CMP, and lipids  Hyperlipidemia/ASCVD Risk Reduction: -Improved LDL recently -Continue current regimen at this time- contacting pharmacy to refill rosuvastatn -Due for lipid panel at next PCP visit; if LDL still >70, consider increasing rosuvastatin  to 40mg  daily  Follow Up Plan: 2 weeks  [name], PharmD, DPLA  "

## 2024-10-11 NOTE — Progress Notes (Signed)
 Outreach unsuccessful. Left voicemail and will attempt to call patient again next week.

## 2024-10-16 ENCOUNTER — Other Ambulatory Visit: Payer: Self-pay

## 2024-10-17 ENCOUNTER — Other Ambulatory Visit: Payer: Self-pay | Admitting: Nurse Practitioner

## 2024-10-17 ENCOUNTER — Other Ambulatory Visit: Payer: Self-pay

## 2024-10-18 ENCOUNTER — Other Ambulatory Visit: Payer: Self-pay

## 2024-10-18 ENCOUNTER — Emergency Department

## 2024-10-18 ENCOUNTER — Encounter: Payer: Self-pay | Admitting: Emergency Medicine

## 2024-10-18 ENCOUNTER — Emergency Department
Admission: EM | Admit: 2024-10-18 | Discharge: 2024-10-18 | Disposition: A | Discharge status: Home / Self Care | Attending: Emergency Medicine | Admitting: Emergency Medicine

## 2024-10-18 DIAGNOSIS — R519 Headache, unspecified: Secondary | ICD-10-CM | POA: Diagnosis present

## 2024-10-18 DIAGNOSIS — J019 Acute sinusitis, unspecified: Secondary | ICD-10-CM

## 2024-10-18 DIAGNOSIS — E1165 Type 2 diabetes mellitus with hyperglycemia: Secondary | ICD-10-CM | POA: Insufficient documentation

## 2024-10-18 DIAGNOSIS — R531 Weakness: Secondary | ICD-10-CM

## 2024-10-18 DIAGNOSIS — J329 Chronic sinusitis, unspecified: Secondary | ICD-10-CM | POA: Insufficient documentation

## 2024-10-18 DIAGNOSIS — R739 Hyperglycemia, unspecified: Secondary | ICD-10-CM

## 2024-10-18 LAB — CBG MONITORING, ED: Glucose-Capillary: 223 mg/dL — ABNORMAL HIGH (ref 70–99)

## 2024-10-18 LAB — BASIC METABOLIC PANEL WITH GFR
Anion gap: 11 (ref 5–15)
BUN: 13 mg/dL (ref 6–20)
CO2: 26 mmol/L (ref 22–32)
Calcium: 8.8 mg/dL — ABNORMAL LOW (ref 8.9–10.3)
Chloride: 97 mmol/L — ABNORMAL LOW (ref 98–111)
Creatinine, Ser: 1.36 mg/dL — ABNORMAL HIGH (ref 0.61–1.24)
GFR, Estimated: >60 mL/min
Glucose, Bld: 530 mg/dL (ref 70–99)
Potassium: 4.2 mmol/L (ref 3.5–5.1)
Sodium: 133 mmol/L — ABNORMAL LOW (ref 135–145)

## 2024-10-18 LAB — CBC
HCT: 41.9 % (ref 39.0–52.0)
Hemoglobin: 14.2 g/dL (ref 13.0–17.0)
MCH: 30.5 pg (ref 26.0–34.0)
MCHC: 33.9 g/dL (ref 30.0–36.0)
MCV: 89.9 fL (ref 80.0–100.0)
Platelets: 175 K/uL (ref 150–400)
RBC: 4.66 MIL/uL (ref 4.22–5.81)
RDW: 12.4 % (ref 11.5–15.5)
WBC: 8.3 K/uL (ref 4.0–10.5)
nRBC: 0 % (ref 0.0–0.2)

## 2024-10-18 LAB — PROTIME-INR
INR: 1 (ref 0.8–1.2)
Prothrombin Time: 13.5 s (ref 11.4–15.2)

## 2024-10-18 MED ORDER — SODIUM CHLORIDE 0.9 % IV BOLUS
1000.0000 mL | Freq: Once | INTRAVENOUS | Status: AC
  Administered 2024-10-18: 1000 mL via INTRAVENOUS

## 2024-10-18 MED ORDER — INSULIN ASPART 100 UNIT/ML IJ SOLN
10.0000 [IU] | Freq: Once | INTRAMUSCULAR | Status: AC
  Administered 2024-10-18: 10 [IU] via INTRAVENOUS
  Filled 2024-10-18: qty 10

## 2024-10-18 MED ORDER — AMOXICILLIN-POT CLAVULANATE 875-125 MG PO TABS
1.0000 | ORAL_TABLET | Freq: Two times a day (BID) | ORAL | 0 refills | Status: AC
  Filled 2024-10-18: qty 20, 10d supply, fill #0

## 2024-10-18 MED ORDER — AMOXICILLIN-POT CLAVULANATE 875-125 MG PO TABS
1.0000 | ORAL_TABLET | Freq: Once | ORAL | Status: AC
  Administered 2024-10-18: 1 via ORAL
  Filled 2024-10-18: qty 1

## 2024-10-18 MED ORDER — LANTUS SOLOSTAR 100 UNIT/ML ~~LOC~~ SOPN
60.0000 [IU] | PEN_INJECTOR | Freq: Every day | SUBCUTANEOUS | 1 refills | Status: AC
  Filled 2024-10-18: qty 15, 25d supply, fill #0

## 2024-10-18 MED ORDER — ACETAMINOPHEN 325 MG PO TABS
650.0000 mg | ORAL_TABLET | Freq: Four times a day (QID) | ORAL | Status: AC | PRN
  Administered 2024-10-18: 650 mg via ORAL
  Filled 2024-10-18: qty 2

## 2024-10-18 NOTE — Telephone Encounter (Signed)
 Requested Prescriptions  Pending Prescriptions Disp Refills   insulin  glargine (LANTUS  SOLOSTAR) 100 UNIT/ML Solostar Pen 15 mL 1    Sig: Inject 60 Units into the skin at bedtime.     Endocrinology:  Diabetes - Insulins Failed - 10/18/2024 10:59 AM      Failed - HBA1C is between 0 and 7.9 and within 180 days    Hgb A1c MFr Bld  Date Value Ref Range Status  07/31/2024 13.2 (H) 4.8 - 5.6 % Final    Comment:             Prediabetes: 5.7 - 6.4          Diabetes: >6.4          Glycemic control for adults with diabetes: <7.0          Passed - Valid encounter within last 6 months    Recent Outpatient Visits           1 month ago Diabetes mellitus treated with injections of non-insulin  medication (HCC)   [place] [facility] [place], Darice, NP   1 month ago Acute left-sided low back pain without sciatica   [place] [facility] [place], Melanie T, NP   2 months ago Diabetes mellitus treated with injections of non-insulin  medication [facility])   [place] [facility] Melvin Darice, NP   8 months ago Diabetes mellitus treated with injections of non-insulin  medication [facility])   [facility] Melvin Darice, NP   9 months ago Hospital discharge follow-up   [facility] Melvin Darice, NP

## 2024-10-18 NOTE — Discharge Instructions (Signed)
 Please drink plenty of fluids and adhere to a diabetic diet.  Please fill and begin taking your antibiotics starting tomorrow 10/19/2024.  Please fill your insulin  as prescribed by your doctor.  Return to the emergency department for any symptom concerning to yourself.

## 2024-10-18 NOTE — ED Notes (Signed)
 Pt up to nurses station stating pressure in face/head is getting worse. Pt requesting tylenol .

## 2024-10-18 NOTE — ED Provider Notes (Signed)
 "  [facility] Provider Note    Event Date/Time   First MD Initiated Contact with Patient 10/18/24 1949     (approximate)  History   Chief Complaint: Shortness of Breath and Weakness  HPI  Ricardo Wood is a 44 y.o. male with a past medical history of bipolar, diabetes, schizophrenia, presents to the emergency department for weakness some shortness of breath as well as some pressure to the left side of his face.  According to the patient over the last several days he has been experiencing generalized weakness.  States he knows his blood sugars elevated because he ran out of his insulin  and is not able to pick more up until tomorrow 1/22.  Patient was concerned that his blood sugar could be elevated.  Patient also states he has been experiencing some pain and pressure to his left face overlying the left maxillary sinus.  Patient is concerned he could be getting a sinus infection he also has a history of dental infections.  Physical Exam   Triage Vital Signs: ED Triage Vitals  Encounter Vitals Group     BP 10/18/24 1701 (!) 148/100     Girls Systolic BP Percentile --      Girls Diastolic BP Percentile --      Boys Systolic BP Percentile --      Boys Diastolic BP Percentile --      Pulse Rate 10/18/24 1701 89     Resp 10/18/24 1701 20     Temp 10/18/24 1701 98.1 F (36.7 C)     Temp Source 10/18/24 1701 Oral     SpO2 10/18/24 1701 97 %     Weight 10/18/24 1700 (!) 335 lb 1.6 oz (152 kg)     Height 10/18/24 1700 6' (1.829 m)     Head Circumference --      Peak Flow --      Pain Score 10/18/24 1700 7     Pain Loc --      Pain Education --      Exclude from Growth Chart --     Most recent vital signs: Vitals:   10/18/24 2000 10/18/24 2100  BP: 139/83 125/84  Pulse: 96 95  Resp:    Temp:    SpO2: 94% 95%    General: Awake, no distress.  Mild tenderness over the left maxillary sinus CV:  Good peripheral perfusion.  Regular rate and rhythm   Resp:  Normal effort.  Equal breath sounds bilaterally.  Abd:  No distention.  Soft, nontender.  No rebound or guarding.  ED Results / Procedures / Treatments   EKG  EKG viewed and interpreted by myself shows normal sinus rhythm at 97 bpm with a narrow QRS, normal axis, normal intervals, no concerning ST changes  RADIOLOGY  I have reviewed interpret the chest x-ray images.  No obvious consolidation on my evaluation. Radiology is read the x-ray is negative.   MEDICATIONS ORDERED IN ED: Medications  acetaminophen  (TYLENOL ) tablet 650 mg (650 mg Oral Given 10/18/24 1857)  sodium chloride  0.9 % bolus 1,000 mL (1,000 mLs Intravenous New Bag/Given 10/18/24 2004)  insulin  aspart (novoLOG ) injection 10 Units (10 Units Intravenous Given 10/18/24 2002)  amoxicillin -clavulanate (AUGMENTIN ) 875-125 MG per tablet 1 tablet (1 tablet Oral Given 10/18/24 2004)     IMPRESSION / MDM / ASSESSMENT AND PLAN / ED COURSE  I reviewed the triage vital signs and the nursing notes.  Patient's presentation is most consistent with acute presentation  with potential threat to life or bodily function.  Patient presents to the emergency department with concerns of generalized weakness, elevated blood glucose and facial pressure/discomfort.  Patient's workup today shows a reassuring CBC, chemistry shows hyperglycemia 530 but a normal anion gap.  Patient's INR is normal.  Chest x-ray is clear.  EKG reassuring.  Given the hyperglycemia with normal anion gap we will IV hydrate with a liter of IV fluids we will dose 10 units of IV insulin  and continue to closely monitor.  Patient does have mild tenderness over the left maxillary sinus with a history of sinus infections.  Will cover with Augmentin  for possible sinusitis.  Patient states he is feeling better, blood sugars down to 223.  Patient states he has insulin  ready to be picked up tomorrow.  Will discharge from the emergency department have the patient follow-up with his  doctor.  Discussed importance to adhere to a diabetic diet with plenty of not sugary hydration.  FINAL CLINICAL IMPRESSION(S) / ED DIAGNOSES   Hyperglycemia Sinusitis   Note:  This document was prepared using Dragon voice recognition software and may include unintentional dictation errors.   Dorothyann Drivers, MD 10/18/24 2127  "

## 2024-10-18 NOTE — ED Triage Notes (Signed)
 Pt endorses SOB, weakness, intermittent confusion and pressure in his face since Monday.

## 2024-10-19 ENCOUNTER — Other Ambulatory Visit: Payer: Self-pay

## 2024-10-23 ENCOUNTER — Other Ambulatory Visit: Payer: Self-pay

## 2024-10-23 ENCOUNTER — Ambulatory Visit: Payer: Self-pay

## 2024-10-23 NOTE — Telephone Encounter (Signed)
 FYI Only or Action Required?: FYI only for provider: ED advised.  Patient was last seen in primary care on 08/31/2024 by Melvin Pao, NP.  Called Nurse Triage reporting Altered Mental Status.  Symptoms began a week ago.  Interventions attempted: Other: ED on 1/21.  Symptoms are: gradually worsening.  Triage Disposition: Call EMS 911 Now  Patient/caregiver understands and will follow disposition?: Yes       Reason for Disposition  [1] Loss of speech or garbled speech AND [2] new-onset  Answer Assessment - Initial Assessment Questions This RN recommended pt be examined in hospital asap via EMS, offered to call 911 for pt, pt declined. Pt verbalized understanding, will head to ER via wife driving him after they drop groceries at home. This RN advised go right away, call 911 if any worsening or new symptoms.     1. LEVEL OF CONSCIOUSNESS: How are they (the patient) acting right now? (e.g., alert-oriented, confused, lethargic, stuporous, comatose)     Alert and oriented Confirms not confused right now Feeling pretty dizzy right now, not like about to pass out Don't get confused very easily, just at times having bouts of confusion, forgetting where I'm at, forgetting what I'm doing  2. ONSET: When did the confusion start?  (e.g., minutes, hours, days)     Since last Monday  3. PATTERN: Does this come and go, or has it been constant since it started?  Is it present now?     Comes and goes, not present now  6. CAUSE: What do you think is causing the confusion?      Thinks more than just the blood sugar, went to ED on 1/21 and they only focused on one thing being the hyperglycemia but pt thinks there's something more going on because confusion is not a usual symptom for him  7. OTHER SYMPTOMS: Are there any other symptoms? (e.g., difficulty breathing, fever, headache, weakness)     Dizzy spells, getting lightheaded Feeling overall weak Garbled speech at  times, not currently Little bit heavy breathing a week ago, now not that bad though still a little heavy Feel warmer than usual Blood sugar always above 200, not checked it today Hallucinations with schizophrenia little bit more frequent but severity is the same Right arm kind of tingly right now, just hit while on the phone, feeling pretty dizzy  Denies: Current confusion Numbness Weakness to one side  Protocols used: Confusion - Delirium-A-AH

## 2024-10-23 NOTE — Progress Notes (Unsigned)
" ° °  10/03/24  Patient ID: Ricardo Wood, male   DOB: 03/06/1981, 44 y.o.   MRN: [id number]  Subjective/Objective Telephone visit to follow-up on medication management of chronic disease states.     Diabetes: Current medications: Lantus  60 units daily, metformin  XR 1000mg  at bedtime, Ozempic  0.5mg  weekly -Patient cannot tolerate metformin  1000mg  BID due to diarrhea, so he is only taking 1000mg  in the evening once home -Patient was seen in the ED somewhat recently for hyperglycemia due to being out of medications -He missed his Ozempic  dose due last Friday and needs this medication and Accu Check Guide test strips refilled at this time -Endorses having enough Lantus  on hand at this time -Tolerating Ozempic  0.5mg  weekly mostly well, but he does have foul smelling and tasting sulfur-like burps -Current experiencing stomach bug with diarrhea for the last 2 days -Patient states FBG is averaging 200-300 currently -Last UACR was 12/24/23, and this was elevated at 30-300 -ACEi/ARB for cardiorenal protection:  olmesartan  20mg  daily -Statin for ASCVD risk reduction:  rosuvastatin  20mg   Lab Results  Component Value Date   HGBA1C 13.2 (H) 07/31/2024   HGBA1C 12.3 (H) 12/24/2023    Hyperlipidemia/ASCVD Risk Reduction Current lipid lowering medications: rosuvastatin  20mg  daily  -Patient is running low on rosuvastatin  and will need a refill     Component Value Date/Time   CHOL 154 07/31/2024 1429   TRIG 151 (H) 07/31/2024 1429   HDL 44 07/31/2024 1429   CHOLHDL 3.5 07/31/2024 1429   LDLCALC 84 07/31/2024 1429   LABVLDL 26 07/31/2024 1429   Assessment/Plan:    Diabetes: -Uncontrolled with A1c goal <7% -UACR is not at goal; consider addition of SGLT2 once A1c better controlled -BP is at goal of <130/80 -LDL is not at goal of <70 -Continue current regimen at this time, but I do recommend initiating glipizide xl 5mg  daily once patient has recovered from stomach bug -Advised him to stay  hydrated and be seen by provider if diarrhea does not resolve in the next 48 hours or if he begins running a fever or develops additional symptoms.  Told him he can use OTC immodium if needed. -Contacting pharmacy to refill Ozempic  and test strips -Goal to increase Ozempic  once sulfur burps decrease; advised patient he can use OTC Tums or famotidine .   -PCP again in March- patient will be due for A1c, CMP, and lipids  Hyperlipidemia/ASCVD Risk Reduction: -Improved LDL recently -Continue current regimen at this time- contacting pharmacy to refill rosuvastatn -Due for lipid panel at next PCP visit; if LDL still >70, consider increasing rosuvastatin  to 40mg  daily  Follow Up Plan: 2 weeks  [name], PharmD, DPLA  "

## 2024-10-23 NOTE — Progress Notes (Unsigned)
" ° °  10/23/2024  Patient ID: Ricardo Wood, male   DOB: 13-Apr-1981, 44 y.o.   MRN: [id number]  Attempted to call patient to follow-up on medication access/adherence and control of diabetes.  I was not able to reach him and noticed prior note from today stating he was going to the ED due to altered mental status.  I will check tomorrow to make sure he was evaluated; as of now, I do not see any notes reflecting he has gone to ED.  [name], PharmD, DPLA  "

## 2024-10-24 ENCOUNTER — Telehealth: Payer: Self-pay | Admitting: Nurse Practitioner

## 2024-10-24 ENCOUNTER — Other Ambulatory Visit: Payer: Self-pay

## 2024-10-24 DIAGNOSIS — E1165 Type 2 diabetes mellitus with hyperglycemia: Secondary | ICD-10-CM | POA: Insufficient documentation

## 2024-10-24 DIAGNOSIS — I152 Hypertension secondary to endocrine disorders: Secondary | ICD-10-CM | POA: Insufficient documentation

## 2024-10-24 DIAGNOSIS — E1169 Type 2 diabetes mellitus with other specified complication: Secondary | ICD-10-CM | POA: Insufficient documentation

## 2024-10-24 NOTE — Patient Instructions (Signed)

## 2024-10-24 NOTE — Telephone Encounter (Signed)
 Copied from CRM #[id number]. Topic: General - Other >> Oct 23, 2024 12:56 PM Tysheama G wrote: Reason for CRM: Patient needs Dr.Holdsworth to fill out some paperwork so he can return back to work. He will either drop it off or fax it her.

## 2024-10-24 NOTE — Telephone Encounter (Signed)
 Noted and will process to provider when received.

## 2024-10-25 ENCOUNTER — Ambulatory Visit: Admitting: Nurse Practitioner

## 2024-10-25 ENCOUNTER — Encounter: Payer: Self-pay | Admitting: Nurse Practitioner

## 2024-10-25 VITALS — BP 106/72 | HR 78 | Temp 97.9°F | Ht 72.0 in | Wt 339.4 lb

## 2024-10-25 DIAGNOSIS — E785 Hyperlipidemia, unspecified: Secondary | ICD-10-CM

## 2024-10-25 DIAGNOSIS — E1159 Type 2 diabetes mellitus with other circulatory complications: Secondary | ICD-10-CM | POA: Diagnosis not present

## 2024-10-25 DIAGNOSIS — Z7985 Long-term (current) use of injectable non-insulin antidiabetic drugs: Secondary | ICD-10-CM

## 2024-10-25 DIAGNOSIS — F200 Paranoid schizophrenia: Secondary | ICD-10-CM

## 2024-10-25 DIAGNOSIS — E1165 Type 2 diabetes mellitus with hyperglycemia: Secondary | ICD-10-CM

## 2024-10-25 DIAGNOSIS — K047 Periapical abscess without sinus: Secondary | ICD-10-CM

## 2024-10-25 DIAGNOSIS — I152 Hypertension secondary to endocrine disorders: Secondary | ICD-10-CM

## 2024-10-25 DIAGNOSIS — E1169 Type 2 diabetes mellitus with other specified complication: Secondary | ICD-10-CM | POA: Diagnosis not present

## 2024-10-25 DIAGNOSIS — J0110 Acute frontal sinusitis, unspecified: Secondary | ICD-10-CM

## 2024-10-25 DIAGNOSIS — F209 Schizophrenia, unspecified: Secondary | ICD-10-CM | POA: Insufficient documentation

## 2024-10-25 NOTE — Assessment & Plan Note (Signed)
Chronic, ongoing.  Continue current medication regimen and adjust as needed.  Lipid panel up to date. 

## 2024-10-25 NOTE — Assessment & Plan Note (Signed)
 Recommend he reach out to [facility] [employer] dental school which would offer lower OOP cost for him. Suspect these chronic infections are not helping with his diabetes and sugar levels.

## 2024-10-25 NOTE — Assessment & Plan Note (Signed)
 Chronic, stable. BP at goal today. Recommend he monitor BP at least a few mornings a week at home and document.  DASH diet at home.  Continue current medication regimen and adjust as needed, ARB for kidney protection.  Labs today: CBC and CMP.

## 2024-10-25 NOTE — Assessment & Plan Note (Signed)
 Chronic, stable. He denies SI/HI. Reports wishes not to restart medication. Discussed at length with him. Has safety plan in place if SI does present.

## 2024-10-25 NOTE — Assessment & Plan Note (Signed)
 Chronic with poor control. Will continue current medication regimen as ordered and collaboration with Channing PharmD.  Discussed with him that would inquire into Freestyle or Dexcom would be covered for him. Suspect we will need to adjust insulin  dose next visit or Ozempic  and may benefit from meal time insulin  for a period. Recommend he check BS 3-4 times a day and document for visits. - Refuses vaccines - Foot exam up to date. Needs eye exam. - Statin and ARB on board. - Recommend he reach out to [facility] dental school as has lots of dental work needs and suspect these chronic infection with poor dentition are not helping sugar levels.
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
## 2024-10-26 ENCOUNTER — Ambulatory Visit: Payer: Self-pay | Admitting: Nurse Practitioner

## 2024-10-26 LAB — COMPREHENSIVE METABOLIC PANEL WITH GFR
ALT: 42 [IU]/L (ref 0–44)
AST: 33 [IU]/L (ref 0–40)
Albumin: 4.2 g/dL (ref 4.1–5.1)
Alkaline Phosphatase: 95 [IU]/L (ref 47–123)
BUN/Creatinine Ratio: 13 (ref 9–20)
BUN: 11 mg/dL (ref 6–24)
Bilirubin Total: 0.3 mg/dL (ref 0.0–1.2)
CO2: 25 mmol/L (ref 20–29)
Calcium: 9.3 mg/dL (ref 8.7–10.2)
Chloride: 101 mmol/L (ref 96–106)
Creatinine, Ser: 0.82 mg/dL (ref 0.76–1.27)
Globulin, Total: 3 g/dL (ref 1.5–4.5)
Glucose: 312 mg/dL — ABNORMAL HIGH (ref 70–99)
Potassium: 4.7 mmol/L (ref 3.5–5.2)
Sodium: 139 mmol/L (ref 134–144)
Total Protein: 7.2 g/dL (ref 6.0–8.5)
eGFR: 113 mL/min/{1.73_m2}

## 2024-10-26 LAB — CBC WITH DIFFERENTIAL/PLATELET
Basophils Absolute: 0.1 10*3/uL (ref 0.0–0.2)
Basos: 1 %
EOS (ABSOLUTE): 0.4 10*3/uL (ref 0.0–0.4)
Eos: 4 %
Hematocrit: 47.7 % (ref 37.5–51.0)
Hemoglobin: 15.2 g/dL (ref 13.0–17.7)
Immature Grans (Abs): 0 10*3/uL (ref 0.0–0.1)
Immature Granulocytes: 0 %
Lymphocytes Absolute: 3.2 10*3/uL — ABNORMAL HIGH (ref 0.7–3.1)
Lymphs: 40 %
MCH: 29.6 pg (ref 26.6–33.0)
MCHC: 31.9 g/dL (ref 31.5–35.7)
MCV: 93 fL (ref 79–97)
Monocytes Absolute: 0.6 10*3/uL (ref 0.1–0.9)
Monocytes: 7 %
Neutrophils Absolute: 3.9 10*3/uL (ref 1.4–7.0)
Neutrophils: 48 %
Platelets: 191 10*3/uL (ref 150–450)
RBC: 5.14 x10E6/uL (ref 4.14–5.80)
RDW: 12.1 % (ref 11.6–15.4)
WBC: 8.2 10*3/uL (ref 3.4–10.8)

## 2024-10-26 LAB — TSH: TSH: 1 u[IU]/mL (ref 0.450–4.500)

## 2024-10-26 LAB — MAGNESIUM: Magnesium: 1.9 mg/dL (ref 1.6–2.3)

## 2024-10-26 NOTE — Progress Notes (Signed)
 Good morning, please let Kaylib know his labs have returned: - CBC overall stable, no infection or anemia. - Kidney and liver function are normal + sodium is now in normal range. Sugar remains elevated at 312, but not as elevated as it was. Continue current diabetes medication and ensure not to miss doses. Also keep upcoming visit with Darice and schedule with [facility] dental school. - Remainder of labs are stable. Any questions? Keep being stellar!!  Thank you for allowing me to participate in your care.  I appreciate you. Kindest regards, Kit Brubacher

## 2024-11-08 ENCOUNTER — Ambulatory Visit: Admitting: Nurse Practitioner
